# Patient Record
Sex: Female | Born: 1950 | Race: White | Hispanic: No | Marital: Married | Smoking: Never smoker
Health system: Southern US, Community
[De-identification: ages and names within clinical notes are randomized; demographics above are authoritative.]

## PROBLEM LIST (undated history)

## (undated) DIAGNOSIS — G4733 Obstructive sleep apnea (adult) (pediatric): Secondary | ICD-10-CM

## (undated) DIAGNOSIS — Z950 Presence of cardiac pacemaker: Secondary | ICD-10-CM

## (undated) DIAGNOSIS — Z95810 Presence of automatic (implantable) cardiac defibrillator: Secondary | ICD-10-CM

## (undated) DIAGNOSIS — E538 Deficiency of other specified B group vitamins: Secondary | ICD-10-CM

## (undated) DIAGNOSIS — E119 Type 2 diabetes mellitus without complications: Secondary | ICD-10-CM

## (undated) DIAGNOSIS — E785 Hyperlipidemia, unspecified: Secondary | ICD-10-CM

## (undated) DIAGNOSIS — T8859XA Other complications of anesthesia, initial encounter: Secondary | ICD-10-CM

## (undated) DIAGNOSIS — Z6839 Body mass index (BMI) 39.0-39.9, adult: Secondary | ICD-10-CM

## (undated) DIAGNOSIS — Z8719 Personal history of other diseases of the digestive system: Secondary | ICD-10-CM

## (undated) DIAGNOSIS — I519 Heart disease, unspecified: Secondary | ICD-10-CM

## (undated) DIAGNOSIS — G43909 Migraine, unspecified, not intractable, without status migrainosus: Secondary | ICD-10-CM

## (undated) DIAGNOSIS — I2510 Atherosclerotic heart disease of native coronary artery without angina pectoris: Secondary | ICD-10-CM

## (undated) DIAGNOSIS — I509 Heart failure, unspecified: Secondary | ICD-10-CM

## (undated) DIAGNOSIS — F419 Anxiety disorder, unspecified: Secondary | ICD-10-CM

## (undated) DIAGNOSIS — I454 Nonspecific intraventricular block: Secondary | ICD-10-CM

## (undated) DIAGNOSIS — I5189 Other ill-defined heart diseases: Secondary | ICD-10-CM

## (undated) DIAGNOSIS — Z9889 Other specified postprocedural states: Secondary | ICD-10-CM

## (undated) DIAGNOSIS — I428 Other cardiomyopathies: Secondary | ICD-10-CM

## (undated) DIAGNOSIS — R112 Nausea with vomiting, unspecified: Secondary | ICD-10-CM

## (undated) DIAGNOSIS — M479 Spondylosis, unspecified: Secondary | ICD-10-CM

## (undated) DIAGNOSIS — I10 Essential (primary) hypertension: Secondary | ICD-10-CM

## (undated) DIAGNOSIS — E7800 Pure hypercholesterolemia, unspecified: Secondary | ICD-10-CM

## (undated) DIAGNOSIS — M549 Dorsalgia, unspecified: Secondary | ICD-10-CM

## (undated) HISTORY — DX: Atherosclerotic heart disease of native coronary artery without angina pectoris: I25.10

## (undated) HISTORY — DX: Body mass index (BMI) 39.0-39.9, adult: Z68.39

## (undated) HISTORY — DX: Hyperlipidemia, unspecified: E78.5

## (undated) HISTORY — PX: CHOLECYSTECTOMY: SHX55

## (undated) HISTORY — DX: Other cardiomyopathies: I42.8

## (undated) HISTORY — DX: Type 2 diabetes mellitus without complications: E11.9

## (undated) HISTORY — DX: Nonspecific intraventricular block: I45.4

## (undated) HISTORY — DX: Heart failure, unspecified: I50.9

## (undated) HISTORY — DX: Dorsalgia, unspecified: M54.9

## (undated) HISTORY — DX: Essential (primary) hypertension: I10

## (undated) HISTORY — DX: Heart disease, unspecified: I51.9

## (undated) HISTORY — DX: Deficiency of other specified B group vitamins: E53.8

## (undated) HISTORY — PX: ABDOMINAL HYSTERECTOMY: SHX81

## (undated) HISTORY — DX: Obstructive sleep apnea (adult) (pediatric): G47.33

## (undated) HISTORY — DX: Pure hypercholesterolemia, unspecified: E78.00

## (undated) HISTORY — PX: PACEMAKER INSERTION: SHX728

## (undated) HISTORY — DX: Spondylosis, unspecified: M47.9

## (undated) HISTORY — DX: Migraine, unspecified, not intractable, without status migrainosus: G43.909

## (undated) HISTORY — DX: Other ill-defined heart diseases: I51.89

## (undated) HISTORY — DX: Anxiety disorder, unspecified: F41.9

---

## 1999-03-01 DIAGNOSIS — E669 Obesity, unspecified: Secondary | ICD-10-CM | POA: Insufficient documentation

## 1999-03-01 DIAGNOSIS — N63 Unspecified lump in unspecified breast: Secondary | ICD-10-CM | POA: Insufficient documentation

## 1999-03-01 HISTORY — DX: Unspecified lump in unspecified breast: N63.0

## 1999-03-01 HISTORY — DX: Obesity, unspecified: E66.9

## 1999-09-02 DIAGNOSIS — I10 Essential (primary) hypertension: Secondary | ICD-10-CM

## 1999-09-02 DIAGNOSIS — E782 Mixed hyperlipidemia: Secondary | ICD-10-CM

## 1999-09-02 HISTORY — DX: Mixed hyperlipidemia: E78.2

## 1999-09-02 HISTORY — DX: Essential (primary) hypertension: I10

## 2007-05-06 HISTORY — PX: BACK SURGERY: SHX140

## 2007-11-12 ENCOUNTER — Ambulatory Visit (HOSPITAL_COMMUNITY): Admission: RE | Admit: 2007-11-12 | Discharge: 2007-11-13 | Payer: Self-pay | Admitting: Neurosurgery

## 2009-05-05 DIAGNOSIS — I219 Acute myocardial infarction, unspecified: Secondary | ICD-10-CM

## 2009-05-05 HISTORY — DX: Acute myocardial infarction, unspecified: I21.9

## 2010-05-05 HISTORY — PX: INSERT / REPLACE / REMOVE PACEMAKER: SUR710

## 2010-09-17 NOTE — Op Note (Signed)
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   PROCEDURE:  C6-C7 anterior cervical decompression and arthrodesis with  allograft and Tether cervical plating.   SURGEON:  Hewitt Shorts, M.D.   ASSISTANT:  Nelia Shi. Webb Silversmith, RN   ANESTHESIA:  General endotracheal.   INDICATIONS:  The patient is a 60 year old woman who presented with a  right C7cervical radiculopathy secondary to a right C6-C7 cervical  desiccation.  Her exam revealed significant weakness to the right  triceps.  Decision was made to do a single level anterior cervical  decompression and arthrodesis.   PROCEDURE:  The patient was brought to the operating room, and placed  under general endotracheal anesthesia.  The patient was placed on 10  pounds of Holter traction.  The neck was prepped with Betadine soap and  solution, and draped in sterile fashion.  A horizontal incision was made  on the left side of the neck.  The line of the incision was infiltrated  with local anesthetic with epinephrine.  Dissection was carried down  through the subcutaneous tissue and platysma.  Bipolar cautery and  electrocautery was used to maintain hemostasis.  Several anterior  jugular veins were coagulated and divided, and then we dissected it  through an avascular plane leaving the sternocleidomastoid, carotid  artery, and jugular vein bilaterally;  and trachea and esophagus  medially.   The ventral aspect of the vertebral column was identified and localizing  x-rays were taken and the C6-C7 intervertebral disk space was  identified.  Discectomy was begun with incision of the annulus and  continued to microcurette with the pituitary rongeurs.  Then, the  microscope was draped and brought to the field to provide additional  navigation, illumination, and visualization.  The remainder of the  decompression was performed using microdissection and microsurgical  technique.   The cauterized end-plates of the vertebral bodies were carefully removed  using microcurette along with the X-max drill.  The anterior osteophytic  overgrowth was removed using the X-max drill and the osteophyte removal  tool.  We then continued the decompression posteriorly.  There were  significant posterior osteophytic overgrowth that was removed using the  X-max drill along with the 2-mm Kerrison punch, and a thin footplate.  Then, we began to remove the posterior longitudinal ligament.   We encountered the disc herniation, which was removed in a piecemeal  fashion removing multiple fragments of disc herniation to the right  side.  The posterior longitudinal ligament was fully removed from across  the posterior aspect of the disc space, and all the disc herniation was  removed.  We were able to decompress the spinal canal and thecal sac as  well as the nerve root within the neural foramina.   Once the decompression was complete, hemostasis was established with the  use of Gelfoam soaked in thrombin.  We measured the height of the  intervertebral disk space, and selected a 7-mm interbody implant.  The  implant was hydrated in saline solution, then positioned in the  intervertebral disk space, and countersunk.  We then selected a 14-mm  Tether cervical plate.  It was secured to the vertebrae with 13-mm x 4.0-  mm in diameter with underlying screws.  Each screw  hole was drilled and  the screws were placed in alternating fashion.  Final tightening was  performed of all 4 screws.   An x-ray was taken and showed 8 screws in the interbody graft at the C6-  C7 level.  However, full visualization was limited due to the patient's  shoulders.  The wound was irrigated with bacitracin solution, checked  for hemostasis, which was confirmed.  Then, we proceeded with the  closure.  The platysma was closed with interrupted inverted 2-0 undyed  Vicryl sutures. The subcutaneous and subcuticular were closed with  interrupted inverted 3-0 undyed Vicryl sutures. The skin was re-  approximated with Dermabond.  The procedure was tolerated well. The  estimated blood loss was 25 mL.  Sponge and needle count were correct.  Following surgery, the patient was taken out of cervical traction,  reversed from the anesthetic, placed in the soft cervical collar,  extubated, and transferred to the recovery room for further care where  she is noted to be removing all 4 extremities to command.      Hewitt Shorts, M.D.  Electronically Signed     RWN/MEDQ  D:  11/12/2007  T:  11/13/2007  Job:  [id number]

## 2011-01-08 HISTORY — PX: CARDIAC DEFIBRILLATOR PLACEMENT: SHX171

## 2011-01-30 LAB — CBC
HCT: 40
Hemoglobin: 14.1
MCHC: 35.3
MCV: 95.9
Platelets: 289
RBC: 4.17
RDW: 13
WBC: 9.4

## 2011-01-30 LAB — BASIC METABOLIC PANEL
BUN: 21
CO2: 26
Calcium: 9.7
Chloride: 102
Creatinine, Ser: 0.85
GFR calc Af Amer: >60
GFR calc non Af Amer: >60
Glucose, Bld: 169 — ABNORMAL HIGH
Potassium: 4.6
Sodium: 137

## 2018-10-21 HISTORY — PX: IMPLANTABLE CARDIOVERTER DEFIBRILLATOR GENERATOR CHANGE: SHX5859

## 2019-07-11 ENCOUNTER — Other Ambulatory Visit: Payer: Self-pay | Admitting: Neurosurgery

## 2019-07-11 DIAGNOSIS — G9519 Other vascular myelopathies: Secondary | ICD-10-CM

## 2019-07-11 DIAGNOSIS — M48062 Spinal stenosis, lumbar region with neurogenic claudication: Secondary | ICD-10-CM

## 2019-07-12 ENCOUNTER — Telehealth: Payer: Self-pay | Admitting: Nurse Practitioner

## 2019-07-12 NOTE — Telephone Encounter (Signed)
Phone call to patient to verify medication list and allergies for myelogram procedure. Pt instructed to hold Lexapro for 48hrs prior to myelogram appointment time. Pt verbalized understanding. Pre and post procedure instructions reviewed with pt. 

## 2019-07-18 ENCOUNTER — Other Ambulatory Visit: Payer: Self-pay

## 2019-07-18 ENCOUNTER — Ambulatory Visit
Admission: RE | Admit: 2019-07-18 | Discharge: 2019-07-18 | Disposition: A | Discharge status: Home / Self Care | Payer: Medicare PPO | Source: Ambulatory Visit | Attending: Neurosurgery | Admitting: Neurosurgery

## 2019-07-18 DIAGNOSIS — N644 Mastodynia: Secondary | ICD-10-CM | POA: Insufficient documentation

## 2019-07-18 DIAGNOSIS — K3184 Gastroparesis: Secondary | ICD-10-CM

## 2019-07-18 DIAGNOSIS — R0609 Other forms of dyspnea: Secondary | ICD-10-CM | POA: Insufficient documentation

## 2019-07-18 DIAGNOSIS — R809 Proteinuria, unspecified: Secondary | ICD-10-CM

## 2019-07-18 DIAGNOSIS — G9519 Other vascular myelopathies: Secondary | ICD-10-CM

## 2019-07-18 DIAGNOSIS — E119 Type 2 diabetes mellitus without complications: Secondary | ICD-10-CM | POA: Insufficient documentation

## 2019-07-18 DIAGNOSIS — F331 Major depressive disorder, recurrent, moderate: Secondary | ICD-10-CM

## 2019-07-18 DIAGNOSIS — E039 Hypothyroidism, unspecified: Secondary | ICD-10-CM | POA: Insufficient documentation

## 2019-07-18 DIAGNOSIS — R0989 Other specified symptoms and signs involving the circulatory and respiratory systems: Secondary | ICD-10-CM | POA: Insufficient documentation

## 2019-07-18 DIAGNOSIS — F411 Generalized anxiety disorder: Secondary | ICD-10-CM

## 2019-07-18 DIAGNOSIS — L98499 Non-pressure chronic ulcer of skin of other sites with unspecified severity: Secondary | ICD-10-CM

## 2019-07-18 DIAGNOSIS — M48062 Spinal stenosis, lumbar region with neurogenic claudication: Secondary | ICD-10-CM

## 2019-07-18 HISTORY — DX: Other specified symptoms and signs involving the circulatory and respiratory systems: R09.89

## 2019-07-18 HISTORY — DX: Non-pressure chronic ulcer of skin of other sites with unspecified severity: L98.499

## 2019-07-18 HISTORY — DX: Generalized anxiety disorder: F41.1

## 2019-07-18 HISTORY — DX: Type 2 diabetes mellitus without complications: E11.9

## 2019-07-18 HISTORY — DX: Other forms of dyspnea: R06.09

## 2019-07-18 HISTORY — DX: Mastodynia: N64.4

## 2019-07-18 HISTORY — DX: Proteinuria, unspecified: R80.9

## 2019-07-18 HISTORY — DX: Major depressive disorder, recurrent, moderate: F33.1

## 2019-07-18 HISTORY — DX: Gastroparesis: K31.84

## 2019-07-18 HISTORY — DX: Hypothyroidism, unspecified: E03.9

## 2019-07-18 MED ORDER — DIAZEPAM 5 MG PO TABS: 5.0000 mg | ORAL_TABLET | Freq: Once | ORAL | Status: DC

## 2019-07-18 MED ORDER — MEPERIDINE HCL 50 MG/ML IJ SOLN
50.0000 mg | Freq: Once | INTRAMUSCULAR | Status: AC
  Administered 2019-07-18: 50 mg via INTRAMUSCULAR

## 2019-07-18 MED ORDER — IOPAMIDOL (ISOVUE-M 200) INJECTION 41%
15.0000 mL | Freq: Once | INTRAMUSCULAR | Status: AC
  Administered 2019-07-18: 2 mL via INTRATHECAL

## 2019-07-18 MED ORDER — ONDANSETRON HCL 4 MG/2ML IJ SOLN
4.0000 mg | Freq: Once | INTRAMUSCULAR | Status: AC
  Administered 2019-07-18: 4 mg via INTRAMUSCULAR

## 2019-07-18 NOTE — Progress Notes (Signed)
Patient states she has been off Lexapro for at least the past two days. 

## 2019-07-18 NOTE — Discharge Instructions (Signed)
Myelogram Discharge Instructions  1. Go home and rest quietly for the next 24 hours.  It is important to lie flat for the next 24 hours.  Get up only to go to the restroom.  You may lie in the bed or on a couch on your back, your stomach, your left side or your right side.  You may have one pillow under your head.  You may have pillows between your knees while you are on your side or under your knees while you are on your back.  2. DO NOT drive today.  Recline the seat as far back as it will go, while still wearing your seat belt, on the way home.  3. You may get up to go to the bathroom as needed.  You may sit up for 10 minutes to eat.  You may resume your normal diet and medications unless otherwise indicated.  Drink lots of extra fluids today and tomorrow.  4. The incidence of headache, nausea, or vomiting is about 5% (one in 20 patients).  If you develop a headache, lie flat and drink plenty of fluids until the headache goes away.  Caffeinated beverages may be helpful.  If you develop severe nausea and vomiting or a headache that does not go away with flat bed rest, call [phone].  5. You may resume normal activities after your 24 hours of bed rest is over; however, do not exert yourself strongly or do any heavy lifting tomorrow. If when you get up you have a headache when standing, go back to bed and force fluids for another 24 hours.  6. Call your physician for a follow-up appointment.  The results of your myelogram will be sent directly to your physician by the following day.  7. If you have any questions or if complications develop after you arrive home, please call [phone].  Discharge instructions have been explained to the patient.  The patient, or the person responsible for the patient, fully understands these instructions.  YOU MAY RESTART YOUR LEXAPRO TOMORROW 07/19/19 AT 1:00PM.

## 2019-08-25 ENCOUNTER — Encounter: Payer: Self-pay | Admitting: Cardiovascular Disease

## 2019-09-05 ENCOUNTER — Telehealth: Payer: Self-pay

## 2019-09-05 NOTE — Telephone Encounter (Signed)
   [facility] Pre-operative Risk Assessment    Request for surgical clearance:  1. What type of surgery is being performed? Radiculopathy--lumbar region   2. When is this surgery scheduled? TBD   3. What type of clearance is required (medical clearance vs. Pharmacy clearance to hold med vs. Both)? both  4. Are there any medications that need to be held prior to surgery and how long? Unknown   5. Practice name and name of physician performing surgery? [facility] --Marchia Meiers. Vertell Limber MD    6. What is your office phone number [phone]    7.   What is your office fax number [phone]  8.   Anesthesia type (None, local, MAC, general) ? General    Dana Grant Dana Grant 09/05/2019, 3:26 PM  _________________________________________________________________   (provider comments below)

## 2019-09-07 NOTE — Telephone Encounter (Signed)
   Primary Cardiologist:No primary care provider on file.  Chart reviewed as part of pre-operative protocol coverage. Dana Grant has never been evaluated by cardiology. Will need to have an office visit. I will route this recommendation to the requesting surgeon office.   Laverda Page, NP  09/07/2019, 10:46 AM

## 2019-09-16 ENCOUNTER — Encounter: Payer: Self-pay | Admitting: *Deleted

## 2019-09-19 ENCOUNTER — Encounter: Payer: Self-pay | Admitting: Cardiovascular Disease

## 2019-09-19 ENCOUNTER — Ambulatory Visit: Payer: Medicare PPO | Admitting: Cardiovascular Disease

## 2019-09-19 ENCOUNTER — Other Ambulatory Visit: Payer: Self-pay

## 2019-09-19 VITALS — BP 142/84 | HR 83 | Ht 66.0 in | Wt 238.4 lb

## 2019-09-19 DIAGNOSIS — I10 Essential (primary) hypertension: Secondary | ICD-10-CM | POA: Diagnosis not present

## 2019-09-19 DIAGNOSIS — I5022 Chronic systolic (congestive) heart failure: Secondary | ICD-10-CM | POA: Diagnosis not present

## 2019-09-19 DIAGNOSIS — Z95810 Presence of automatic (implantable) cardiac defibrillator: Secondary | ICD-10-CM | POA: Diagnosis not present

## 2019-09-19 DIAGNOSIS — Z01818 Encounter for other preprocedural examination: Secondary | ICD-10-CM

## 2019-09-19 NOTE — Patient Instructions (Addendum)
Medication Instructions:  Continue all current medications.  Labwork: none  Testing/Procedures: none  Follow-Up: 3 months   Any Other Special Instructions Will Be Listed Below (If Applicable). You have been referred to:  EP - Dr. Johney Frame   If you need a refill on your cardiac medications before your next appointment, please call your pharmacy.

## 2019-09-19 NOTE — Progress Notes (Addendum)
CARDIOLOGY CONSULT NOTE  Patient ID: Dana Grant MRN: [id number] DOB/AGE: [birth year] 69 y.o.  Admit date: (Not on file) Primary Physician: Lonia Mad, MD  Reason for Consultation: Preoperative risk stratification  HPI: Dana Grant is a 69 y.o. female who is being seen today for the evaluation of Preoperative risk stratification at the request of Erline Levine, MD.   She required spinal surgery for lumbar radiculopathy.  The patient is a poor historian and is unable to provide many details.  She did tell me that she had a pacemaker/defibrillator placed in 2012 in [place].  She then followed with a cardiologist in [place] but she has not seen since 2019.  Her husband is also present.  He said that she underwent a generator change within the past 1 or 2 years.  She is currently on carvedilol and losartan.  She mentions something about her PCP stopping both of these medications after getting back some blood work.  She told me that her left leg was swollen but then her PCP stopped Lasix after reviewing blood work.  I do not have a copy of any notes from her PCP, cardiologist, nor do I have any recent blood work.  She denies shocks from her defibrillator as well as palpitations.  She denies a history of cardiac catheterization.  She said she underwent a stress test in 2009.  I personally reviewed the ECG performed today which demonstrates a ventricular paced rhythm.   Allergies  Allergen Reactions  . Codeine Nausea And Vomiting  . Sulfa Antibiotics Nausea And Vomiting    Current Outpatient Medications  Medication Sig Dispense Refill  . ALPRAZolam (XANAX) 1 MG tablet Take 1 mg by mouth 3 (three) times daily as needed for anxiety.    . carvedilol (COREG) 6.25 MG tablet Take 6.25 mg by mouth 2 (two) times daily.     . cyanocobalamin (,VITAMIN B-12,) 1000 MCG/ML injection Inject 1,000 mcg into the muscle 2 (two) times a week.    . furosemide (LASIX) 20 MG  tablet Take 20 mg by mouth daily. To equal 60mg  daily    . furosemide (LASIX) 40 MG tablet Take 1 tablet by mouth daily. To equal 60mg  daily.    Marland Kitchen gabapentin (NEURONTIN) 100 MG capsule Take 200 mg by mouth 3 (three) times daily.    . insulin NPH-regular Human (70-30) 100 UNIT/ML injection Inject 70 Units into the skin 2 (two) times daily.     Marland Kitchen levothyroxine (SYNTHROID) 75 MCG tablet Take 75 mcg by mouth in the morning.    Marland Kitchen losartan (COZAAR) 50 MG tablet Take 50 mg by mouth 2 (two) times daily.    . meclizine (ANTIVERT) 25 MG tablet Take 25 mg by mouth as needed for dizziness.    . Vitamin D, Ergocalciferol, (DRISDOL) 1.25 MG (50000 UNIT) CAPS capsule Take 50,000 Units by mouth 2 (two) times a week.     No current facility-administered medications for this visit.    History reviewed. No pertinent past medical history.  History reviewed. No pertinent surgical history.  Social History   Socioeconomic History  . Marital status: Married    Spouse name: Not on file  . Number of children: Not on file  . Years of education: Not on file  . Highest education level: Not on file  Occupational History  . Not on file  Tobacco Use  . Smoking status: Never Smoker  . Smokeless tobacco: Never Used  Substance and Sexual Activity  .  Alcohol use: Not on file  . Drug use: Not on file  . Sexual activity: Not on file  Other Topics Concern  . Not on file  Social History Narrative  . Not on file   Social Determinants of Health   Financial Resource Strain:   . Difficulty of Paying Living Expenses:   Food Insecurity:   . Worried About Programme researcher, broadcasting/film/video in the Last Year:   . Barista in the Last Year:   Transportation Needs:   . Freight forwarder (Medical):   Marland Kitchen Lack of Transportation (Non-Medical):   Physical Activity:   . Days of Exercise per Week:   . Minutes of Exercise per Session:   Stress:   . Feeling of Stress :   Social Connections:   . Frequency of Communication with  Friends and Family:   . Frequency of Social Gatherings with Friends and Family:   . Attends Religious Services:   . Active Member of Clubs or Organizations:   . Attends Banker Meetings:   Marland Kitchen Marital Status:   Intimate Partner Violence:   . Fear of Current or Ex-Partner:   . Emotionally Abused:   Marland Kitchen Physically Abused:   . Sexually Abused:      No family history of premature CAD in 1st degree relatives.  Current Meds  Medication Sig  . ALPRAZolam (XANAX) 1 MG tablet Take 1 mg by mouth 3 (three) times daily as needed for anxiety.  . carvedilol (COREG) 6.25 MG tablet Take 6.25 mg by mouth 2 (two) times daily.   . cyanocobalamin (,VITAMIN B-12,) 1000 MCG/ML injection Inject 1,000 mcg into the muscle 2 (two) times a week.  . furosemide (LASIX) 20 MG tablet Take 20 mg by mouth daily. To equal 60mg  daily  . furosemide (LASIX) 40 MG tablet Take 1 tablet by mouth daily. To equal 60mg  daily.  gabapentin (NEURONTIN) 100 MG capsule Take 200 mg by mouth 3 (three) times daily.  . insulin NPH-regular Human (70-30) 100 UNIT/ML injection Inject 70 Units into the skin 2 (two) times daily.   levothyroxine (SYNTHROID) 75 MCG tablet Take 75 mcg by mouth in the morning.  Marland Kitchen losartan (COZAAR) 50 MG tablet Take 50 mg by mouth 2 (two) times daily.  . meclizine (ANTIVERT) 25 MG tablet Take 25 mg by mouth as needed for dizziness.  . Vitamin D, Ergocalciferol, (DRISDOL) 1.25 MG (50000 UNIT) CAPS capsule Take 50,000 Units by mouth 2 (two) times a week.      Review of systems complete and found to be negative unless listed above in HPI   [facility], LPN was present throughout the entirety of the encounter.  Physical exam Blood pressure (!) 142/84, pulse 83, height 5\' 6"  (1.676 m), weight 238 lb 6.4 oz (108.1 kg), SpO2 92 %. General: NAD Neck: No JVD, no thyromegaly or thyroid nodule.  Lungs: Clear to auscultation bilaterally with normal respiratory effort. CV: Nondisplaced PMI. Regular  rate and rhythm, normal S1/S2, no S3/S4, no murmur.  Trace bilateral lower extremity edema.   Abdomen: Soft, nontender, no distention.  Skin: Intact without lesions or rashes.  Neurologic: Alert and oriented x 3.  Memory appears to be poor. Psych: Normal affect. Extremities: No clubbing or cyanosis.  HEENT: Normal.   ECG: Most recent ECG reviewed.   Labs: Lab Results  Component Value Date/Time   K 4.6 11/12/2007 01:10 PM   BUN 21 11/12/2007 01:10 PM   CREATININE 0.85 11/12/2007 01:10  PM   HGB 14.1 11/12/2007 01:10 PM     Lipids: No results found for: LDLCALC, LDLDIRECT, CHOL, TRIG, HDL      ASSESSMENT AND PLAN:   1.  Chronic systolic heart failure: No evidence of hypervolemia.  I have no cardiac records including no copy of most recent echocardiogram nor cardiology office notes.  I will try to obtain these.  Currently on carvedilol, losartan, and Lasix 60 mg daily.  There apparently have been some medication adjustments performed by her PCP but I am unclear on these details as I have no copy of her PCP office notes.  I will try to request these as well.  2.  ICD: No recent shocks.  She says this was initially implanted in 2012 with generator change within the past 1 or 2 years.  I will make a referral to the device clinic and try to obtain cardiac records from her cardiologist in [place].  3.  Preoperative risk stratification: I am unable to provide any assistance with this as I have no cardiac records and no PCP notes or labs.  Further recommendations will be made after these are obtained and I have had a chance to review them.  4.  Hypertension: BP is mildly elevated.  No changes to therapy today as I am uncertain as to which medications she is actually taking.    Disposition: Follow up in 3 months  Signed: Prentice Docker, M.D., F.A.C.C.  09/19/2019, 3:25 PM    Addendum: I was able to obtain some of the patient's records.    She apparently had normalization of  LVEF by echocardiogram in February 2015.  She reportedly underwent cardiac catheterization in 2012 with pacemaker placed in September 2012 and generator change in June 2020.  She underwent a normal Lexiscan Myoview in February 2016.  I also reviewed labs dated 08/25/2019: White blood cells 11.4, hemoglobin 12.4, platelets 241, BUN 26, creatinine 1.63, sodium 140, potassium 4.4, hemoglobin A1c 7.9%, TSH 52.32.

## 2019-09-20 NOTE — Addendum Note (Signed)
Addended by: Burman Nieves T on: 09/20/2019 08:56 AM   Modules accepted: Orders

## 2019-09-22 ENCOUNTER — Telehealth: Payer: Self-pay | Admitting: Cardiovascular Disease

## 2019-09-22 NOTE — Telephone Encounter (Signed)
   [facility] Pre-operative Risk Assessment    HEARTCARE STAFF: - Please ensure there is not already an duplicate clearance open for this procedure - Under Visit Info/Reason for Call, type in Other and utilize the format Clearance MM/DD/YY or Clearance TBD  Request for surgical clearance:  1. What type of surgery is being performed?  L2-3  L3-4 L4-5 Anterolateral lumbar interbody fusion with percutaneous pedicle screw fixation  2. When is this surgery scheduled? TBD   3. What type of clearance is required (medical clearance vs. Pharmacy clearance to hold med vs. Both)? Not stated  4. Are there any medications that need to be held prior to surgery and how long? Doesn't state medication but asks if patient needs to hold medication and for how many days before surgery  5. Practice name and name of physician performing surgery? [facility]   Dr Erline Levine  6. What is the office phone number? [phone]   7.   What is the office fax number? [phone]  8.   Anesthesia type (None, local, MAC, general) ?  Asking the following ___ Low Risk  ___Moderate Risk __High Risk  ___ Surgery should be postponed    Weston Anna 09/22/2019, 1:30 PM  _________________________________________________________________   (provider comments below)

## 2019-10-04 NOTE — Telephone Encounter (Signed)
Called the requesting office of [facility]  and spoke with Shanda Bumps in surgery scheduling. Shanda Bumps stated that the patient is wanting the procedure done ASAP. The surgeon's schedule can be done at end of June early July. Shanda Bumps informed me that the patient did not want to return to her old cardiologist who is over 3 hours away and that their office is aware that records would be needed if the patient saw a new cardiologist in the area.

## 2019-10-04 NOTE — Telephone Encounter (Signed)
Patient was recently seen by Dr. Purvis Sheffield for preop clearance, however due to lack of record, Dr. Purvis Sheffield could not make a decision at the time. I have messaged his nurse Garnet Sierras LPN in our [place] to make sure those record are requested before Dr. Purvis Sheffield returns to the office. Unfortunately, I think Dr. Purvis Sheffield is out of office for 3 weeks. Callback pool to check with requesting provider to see how urgent this surgery is?

## 2019-10-05 NOTE — Telephone Encounter (Signed)
Pt has an appointment with Dr. Johney Frame on 10/12/19

## 2019-10-06 ENCOUNTER — Telehealth: Payer: Self-pay | Admitting: Cardiovascular Disease

## 2019-10-06 NOTE — Telephone Encounter (Signed)
Please make sure we get Dr. Johney Frame a copy of her old cardiology record from [facility] for Dr. Johney Frame to make a decision. Also note on the visit that patient require cardiac clearance

## 2019-10-06 NOTE — Telephone Encounter (Signed)
    Went to chart to check who called pt. Transferred call to [facility]

## 2019-10-06 NOTE — Telephone Encounter (Signed)
Patient returned my call and informed me the the cardiologist she saw in [place] is Dr. Bobby Rumpf - [facility]  Phone #: [phone] Fax: [phone].  I called Dr. Mady Gemma office to see if they can send records to our office but I have send over the a request. I have faxed over a medical records request over to the Dr. Elder Love office.

## 2019-10-06 NOTE — Telephone Encounter (Signed)
I left a message for the patient to return my call to inquire about her cardiologist she saw in [place].  I also call over to Dr. Purvis Sheffield office in [place] to see if they received any notes on the patient but they informed me that they haven't.

## 2019-10-11 ENCOUNTER — Telehealth: Payer: Self-pay

## 2019-10-11 NOTE — Telephone Encounter (Signed)
GAVE NOTES FROM [facility] TO [facility]ICAL RECORDS TO SCAN INTO CHART

## 2019-10-11 NOTE — Telephone Encounter (Signed)
Dana Grant states that she has received notes from Dr. Elder Love from [facility] and has given them to Medical Records to scan to the chart. Will remove from the pre-op pool.

## 2019-10-11 NOTE — Telephone Encounter (Signed)
We have not received records from Dr. Bobby Rumpf at [facility] yet. I have spoke to [facility] in [facility] and she is going to work on getting records prior to this patient's appointment with Dr. Johney Frame tomorrow 10/12/19 that way Dr. Johney Frame will be able to review notes and make a decision regarding clearance for his upcoming procedure. Will forward to his RN to make aware.

## 2019-10-11 NOTE — Telephone Encounter (Signed)
Patient has an appointment with Dr. Johney Frame tomorrow so pre-op risk assessment can be completed at that time. Will route this message to Dr. Johney Frame so that he is aware and remove from pre-op pool.

## 2019-10-12 ENCOUNTER — Other Ambulatory Visit: Payer: Self-pay

## 2019-10-12 ENCOUNTER — Telehealth (INDEPENDENT_AMBULATORY_CARE_PROVIDER_SITE_OTHER): Payer: Medicare PPO | Admitting: Internal Medicine

## 2019-10-12 ENCOUNTER — Encounter: Payer: Self-pay | Admitting: Internal Medicine

## 2019-10-12 VITALS — BP 116/75 | HR 81 | Ht 66.0 in | Wt 238.5 lb

## 2019-10-12 DIAGNOSIS — I428 Other cardiomyopathies: Secondary | ICD-10-CM | POA: Diagnosis not present

## 2019-10-12 DIAGNOSIS — I5022 Chronic systolic (congestive) heart failure: Secondary | ICD-10-CM | POA: Diagnosis not present

## 2019-10-12 DIAGNOSIS — Z01810 Encounter for preprocedural cardiovascular examination: Secondary | ICD-10-CM | POA: Diagnosis not present

## 2019-10-12 DIAGNOSIS — Z01818 Encounter for other preprocedural examination: Secondary | ICD-10-CM

## 2019-10-12 NOTE — Progress Notes (Signed)
Electrophysiology TeleHealth Note   Due to national recommendations of social distancing due to COVID 19, Audio/video telehealth visit is felt to be most appropriate for this patient at this time.  See MyChart message from today for patient consent regarding telehealth for [facility].   Date:  10/12/2019   ID:  Dana Grant, DOB 07-12-1950, MRN [id number]  Location: home Provider location: [facility] Evaluation Performed: New patient consult  PCP:  Arma Heading, MD  Cardiologist:  Prentice Docker, MD  Electrophysiologist:  None   Chief Complaint:  CHF  History of Present Illness:    Dana Grant is a 69 y.o. female who presents via audio/video conferencing for a telehealth visit today.   The patient is referred for new consultation regarding risk stratification of sudden death by Dr Purvis Sheffield. She has a history of nonischemic CM and is s/p CRT-D implant in [place].  She denies prior ICD shock therapy.  Currently, she is doing well. Her primary issues are with back pain.  She is planning to have surgery soon.  Today, she denies symptoms of palpitations, chest pain, shortness of breath, orthopnea, PND, lower extremity edema, claudication, dizziness, presyncope, syncope, bleeding, or neurologic sequela. The patient is tolerating medications without difficulties and is otherwise without complaint today.     Past Medical History:  Diagnosis Date  . Anxiety   . Anxiety state 07/18/2019   ICD10 Conversion  . Back pain   . BBB (bundle branch block)   . BMI 39.0-39.9,adult   . Breast mass 03/01/1999  . CAD (coronary artery disease)   . Cardiopathy   . CHF (congestive heart failure) (HCC)   . Chronic ulcer of other specified site 07/18/2019  . DM (diabetes mellitus) (HCC)   . Dyslipidemia   . Essential hypertension 09/02/1999   ICD10 Conversion  . Gastroparesis 07/18/2019  . Hypercholesteremia   . Hyperlipidemia, mixed 09/02/1999  . Hypertension   .  Hypothyroidism 07/18/2019   ICD10 Conversion  . Left ventricular systolic dysfunction   . Major depressive disorder, recurrent episode, moderate (HCC) 07/18/2019  . Mastodynia 07/18/2019  . Migraine   . Nonischemic cardiomyopathy (HCC)   . Obesity 03/01/1999   ICD10 Conversion  . OSA on CPAP   . Other dyspnea and respiratory abnormality 07/18/2019  . Proteinuria 07/18/2019  . Spondylosis   . Type II or unspecified type diabetes mellitus without mention of complication, not stated as uncontrolled 07/18/2019  . Vitamin B12 deficiency     Past Surgical History:  Procedure Laterality Date  . CARDIAC DEFIBRILLATOR PLACEMENT  01/08/2011   implanted in [place] (SJM) BiV ICD  . CESAREAN SECTION    . CHOLECYSTECTOMY    . IMPLANTABLE CARDIOVERTER DEFIBRILLATOR GENERATOR CHANGE  10/21/2018   placed in [place],  [place] model [id number] [phone] [id number]) BiV ICD,  RV lead- [phone] [facility] [id number])  implanted by Dr Sabino Donovan    Current Outpatient Medications  Medication Sig Dispense Refill  . ALPRAZolam (XANAX) 1 MG tablet Take 1 mg by mouth 3 (three) times daily as needed for anxiety.    Marland Kitchen aspirin EC 81 MG tablet Take 81 mg by mouth daily.    Marland Kitchen atorvastatin (LIPITOR) 10 MG tablet Take 10 mg by mouth daily.    . carvedilol (COREG) 6.25 MG tablet Take 6.25 mg by mouth 2 (two) times daily.     . cyanocobalamin (,VITAMIN B-12,) 1000 MCG/ML injection Inject 1,000 mcg into the muscle every 30 (thirty) days.     Marland Kitchen  DULoxetine (CYMBALTA) 30 MG capsule Take 1 capsule by mouth in the morning and at bedtime.    . furosemide (LASIX) 20 MG tablet Take 20 mg by mouth daily. To equal 60mg  daily    . furosemide (LASIX) 40 MG tablet Take 1 tablet by mouth daily. To equal 60mg  daily.    Marland Kitchen gabapentin (NEURONTIN) 600 MG tablet Take 1 tablet by mouth 4 (four) times daily.    Marland Kitchen ibuprofen (ADVIL) 800 MG tablet Take 1 tablet by mouth as needed for pain.    Marland Kitchen insulin NPH-regular Human (70-30) 100 UNIT/ML injection Inject 70  Units into the skin 2 (two) times daily.     Marland Kitchen levothyroxine (SYNTHROID) 75 MCG tablet Take 75 mcg by mouth in the morning.    Marland Kitchen losartan (COZAAR) 50 MG tablet Take 50 mg by mouth 2 (two) times daily.    . meclizine (ANTIVERT) 25 MG tablet Take 25 mg by mouth as needed for dizziness.    . Multiple Vitamin (MULTIVITAMIN) capsule Take 1 capsule by mouth daily.    Marland Kitchen omega-3 acid ethyl esters (LOVAZA) 1 g capsule Take by mouth 2 (two) times daily.    . Vitamin D, Ergocalciferol, (DRISDOL) 1.25 MG (50000 UNIT) CAPS capsule Take 50,000 Units by mouth 2 (two) times a week.     No current facility-administered medications for this visit.    Allergies:   Codeine, Sulfa antibiotics, Demerol [meperidine], Lisinopril, and Penicillins   Social History:  The patient  reports that she has never smoked. She has never used smokeless tobacco. She reports that she does not drink alcohol or use drugs.   Family History:  The patient's  family history includes CAD in her mother.    ROS:  Please see the history of present illness.   All other systems are personally reviewed and negative.    Exam:    Vital Signs:  BP 116/75   Pulse 81   Ht 5\' 6"  (1.676 m)   Wt 238 lb 8 oz (108.2 kg)   BMI 38.49 kg/m    Well sounding,  Poor hearing, alert and conversant    Labs/Other Tests and Data Reviewed:    Recent Labs: No results found for requested labs within last 8760 hours.   Wt Readings from Last 3 Encounters:  10/12/19 238 lb 8 oz (108.2 kg)  09/19/19 238 lb 6.4 oz (108.1 kg)     Other studies personally reviewed: Additional studies/ records that were reviewed today include: prior ICD interrogation (in media), Dr Raylene Everts notes, generator change note from 10/2018  Review of the above records today demonstrates: as above   ASSESSMENT & PLAN:    1.  Nonischemic CM/ chronic systolic dysfunction S/p prior CRT-D device (St Jude) as above. I will arrange remote monitoring through our office and also  ICM follow-up. No changes indicated at this time.  2. preop clearance Ok to proceed from an EP standpoint with back surgery Dr Court Joy notes suggest that he is obtaining records from primary care/ prior cardiology before making his recommendations regarding surgical clearance    Follow-up:  12 months with me in [place]  Patient Risk:  after full review of this patients clinical status, I feel that they are at moderate risk at this time.   Today, I have spent 20 minutes with the patient with telehealth technology discussing ICD care .    Signed, Thompson Grayer MD, [place] 10/12/2019 10:31 AM   [facility]  [address] [place] [address] [phone] (office) [phone] (fax)

## 2019-10-14 ENCOUNTER — Other Ambulatory Visit: Payer: Self-pay | Admitting: Neurosurgery

## 2019-10-24 ENCOUNTER — Telehealth: Payer: Self-pay

## 2019-10-24 NOTE — Telephone Encounter (Signed)
-----   Message from Hillis Range, MD sent at 10/12/2019 10:32 AM EDT ----- Please enroll in [facility] device clinic

## 2019-10-24 NOTE — Telephone Encounter (Signed)
Patient referred to [facility] clinic by Dr Johney Frame.  Attempted ICM referral call for intro and left message for return call.    Reviewed Corvue updated Naval architect through 10/23/2019.  Corvue Thoracic impedance suggesting start of possible fluid accumulation since 10/22/2019.

## 2019-10-31 NOTE — Progress Notes (Signed)
[id number] [place], [place] [address] [place] [address] Phone: [phone] Fax: [phone]      Your procedure is scheduled on Thursday 11/03/2019.  Report to [facility] Main Entrance "A" at 07:00 A.M., and check in at the Admitting office.  Call this number if you have problems the morning of surgery:  [phone]  Call [phone] if you have any questions prior to your surgery date Monday-Friday 8am-4pm    Remember:  Do not eat or drink after midnight the night before your surgery   Take these medicines the morning of surgery with A SIP OF WATER: Carvedilol (Coreg) Duloxetine (Cymbalta) Gabapentin (Neurontin) Levothyroxine (Synthroid)  If needed: Alprazolam (Xanax)   As of today, STOP taking any Aspirin (unless otherwise instructed by your surgeon) and Aspirin containing products, Aleve, Naproxen, Ibuprofen, Motrin, Advil, Goody's, BC's, all herbal medications, fish oil, and all vitamins.  Follow your surgeon's instructions on when to stop Aspirin.  If no instructions were given by your surgeon then you will need to call the office to get those instructions.     WHAT DO I DO ABOUT MY DIABETES MEDICATION?   Marland Kitchen Do not take oral diabetes medicines (pills) the morning of surgery.  . THE NIGHT BEFORE SURGERY, take 49 units of Humulin (70/30) insulin.       . THE MORNING OF SURGERY, do not take any insulin.    HOW TO MANAGE YOUR DIABETES BEFORE AND AFTER SURGERY  Why is it important to control my blood sugar before and after surgery? . Improving blood sugar levels before and after surgery helps healing and can limit problems. . A way of improving blood sugar control is eating a healthy diet by: o  Eating less sugar and carbohydrates o  Increasing activity/exercise o  Talking with your doctor about reaching your blood sugar goals . High blood sugars (greater than 180 mg/dL) can raise your risk of infections and slow your  recovery, so you will need to focus on controlling your diabetes during the weeks before surgery. . Make sure that the doctor who takes care of your diabetes knows about your planned surgery including the date and location.  How do I manage my blood sugar before surgery? . Check your blood sugar at least 4 times a day, starting 2 days before surgery, to make sure that the level is not too high or low. . Check your blood sugar the morning of your surgery when you wake up and every 2 hours until you get to the Short Stay unit. o If your blood sugar is less than 70 mg/dL, you will need to treat for low blood sugar: - Do not take insulin. - Treat a low blood sugar (less than 70 mg/dL) with  cup of clear juice (cranberry or apple), 4 glucose tablets, OR glucose gel. - Recheck blood sugar in 15 minutes after treatment (to make sure it is greater than 70 mg/dL). If your blood sugar is not greater than 70 mg/dL on recheck, call [phone] for further instructions. . Report your blood sugar to the short stay nurse when you get to Short Stay.  . If you are admitted to the hospital after surgery: o Your blood sugar will be checked by the staff and you will probably be given insulin after surgery (instead of oral diabetes medicines) to make sure you have good blood sugar levels. o The goal for blood sugar control after surgery is 80-180 mg/dL.  Do not wear jewelry, make up, or nail polish            Do not wear lotions, powders, perfumes/colognes, or deodorant.            Do not shave 48 hours prior to surgery.  Men may shave face and neck.            Do not bring valuables to the hospital.            [facility] is not responsible for any belongings or valuables.  Do NOT Smoke (Tobacco/Vapping) or drink Alcohol 24 hours prior to your procedure  If you use a CPAP at night, you may bring all equipment for your overnight stay.   Contacts, glasses, dentures or bridgework may not be  worn into surgery.      For patients admitted to the hospital, discharge time will be determined by your treatment team.   Patients discharged the day of surgery will not be allowed to drive home, and someone needs to stay with them for 24 hours.    Special instructions:   [facility]- Preparing For Surgery  Before surgery, you can play an important role. Because skin is not sterile, your skin needs to be as free of germs as possible. You can reduce the number of germs on your skin by washing with CHG (chlorahexidine gluconate) Soap before surgery.  CHG is an antiseptic cleaner which kills germs and bonds with the skin to continue killing germs even after washing.    Oral Hygiene is also important to reduce your risk of infection.  Remember - BRUSH YOUR TEETH THE MORNING OF SURGERY WITH YOUR REGULAR TOOTHPASTE  Please do not use if you have an allergy to CHG or antibacterial soaps. If your skin becomes reddened/irritated stop using the CHG.  Do not shave (including legs and underarms) for at least 48 hours prior to first CHG shower. It is OK to shave your face.  Please follow these instructions carefully.   1. Shower the NIGHT BEFORE SURGERY and the MORNING OF SURGERY with CHG Soap.   2. If you chose to wash your hair, wash your hair first as usual with your normal shampoo.  3. After you shampoo, rinse your hair and body thoroughly to remove the shampoo.  4. Use CHG as you would any other liquid soap. You can apply CHG directly to the skin and wash gently with a scrungie or a clean washcloth.   5. Apply the CHG Soap to your body ONLY FROM THE NECK DOWN.  Do not use on open wounds or open sores. Avoid contact with your eyes, ears, mouth and genitals (private parts). Wash Face and genitals (private parts)  with your normal soap.   6. Wash thoroughly, paying special attention to the area where your surgery will be performed.  7. Thoroughly rinse your body with warm water from the neck  down.  8. DO NOT shower/wash with your normal soap after using and rinsing off the CHG Soap.  9. Pat yourself dry with a CLEAN TOWEL.  10. Wear CLEAN PAJAMAS to bed the night before surgery, wear comfortable clothes the morning of surgery  11. Place CLEAN SHEETS on your bed the night of your first shower and DO NOT SLEEP WITH PETS.   Day of Surgery:   Do not apply any deodorants/lotions.  Please wear clean clothes to the hospital/surgery center.   Remember to brush your teeth WITH YOUR REGULAR TOOTHPASTE.   Please  read over the following fact sheets that you were given.

## 2019-11-01 ENCOUNTER — Other Ambulatory Visit: Payer: Self-pay

## 2019-11-01 ENCOUNTER — Other Ambulatory Visit (HOSPITAL_COMMUNITY)
Admission: RE | Admit: 2019-11-01 | Discharge: 2019-11-01 | Disposition: A | Discharge status: Home / Self Care | Payer: Medicare PPO | Source: Ambulatory Visit | Attending: Neurosurgery | Admitting: Neurosurgery

## 2019-11-01 ENCOUNTER — Encounter (HOSPITAL_COMMUNITY)
Admission: RE | Admit: 2019-11-01 | Discharge: 2019-11-01 | Disposition: A | Discharge status: Home / Self Care | Payer: Medicare PPO | Source: Ambulatory Visit | Attending: Neurosurgery | Admitting: Neurosurgery

## 2019-11-01 ENCOUNTER — Encounter (HOSPITAL_COMMUNITY): Payer: Self-pay

## 2019-11-01 HISTORY — DX: Presence of cardiac pacemaker: Z95.0

## 2019-11-01 HISTORY — DX: Presence of automatic (implantable) cardiac defibrillator: Z95.810

## 2019-11-01 HISTORY — DX: Nausea with vomiting, unspecified: Z98.890

## 2019-11-01 HISTORY — DX: Personal history of other diseases of the digestive system: Z87.19

## 2019-11-01 HISTORY — DX: Other complications of anesthesia, initial encounter: T88.59XA

## 2019-11-01 HISTORY — DX: Nausea with vomiting, unspecified: R11.2

## 2019-11-01 LAB — CBC
HCT: 43.8 % (ref 36.0–46.0)
Hemoglobin: 15.1 g/dL — ABNORMAL HIGH (ref 12.0–15.0)
MCH: 33.9 pg (ref 26.0–34.0)
MCHC: 34.5 g/dL (ref 30.0–36.0)
MCV: 98.2 fL (ref 80.0–100.0)
Platelets: 240 10*3/uL (ref 150–400)
RBC: 4.46 MIL/uL (ref 3.87–5.11)
RDW: 12.6 % (ref 11.5–15.5)
WBC: 9.6 10*3/uL (ref 4.0–10.5)
nRBC: 0 % (ref 0.0–0.2)

## 2019-11-01 LAB — BASIC METABOLIC PANEL
Anion gap: 10 (ref 5–15)
BUN: 21 mg/dL (ref 8–23)
CO2: 28 mmol/L (ref 22–32)
Calcium: 9.5 mg/dL (ref 8.9–10.3)
Chloride: 103 mmol/L (ref 98–111)
Creatinine, Ser: 1.21 mg/dL — ABNORMAL HIGH (ref 0.44–1.00)
GFR calc Af Amer: 52 mL/min — ABNORMAL LOW (ref >60)
GFR calc non Af Amer: 45 mL/min — ABNORMAL LOW (ref >60)
Glucose, Bld: 144 mg/dL — ABNORMAL HIGH (ref 70–99)
Potassium: 3.7 mmol/L (ref 3.5–5.1)
Sodium: 141 mmol/L (ref 135–145)

## 2019-11-01 LAB — TYPE AND SCREEN
ABO/RH(D): A POS
Antibody Screen: NEGATIVE

## 2019-11-01 LAB — SURGICAL PCR SCREEN
MRSA, PCR: NEGATIVE
Staphylococcus aureus: NEGATIVE

## 2019-11-01 LAB — ABO/RH: ABO/RH(D): A POS

## 2019-11-01 LAB — GLUCOSE, CAPILLARY: Glucose-Capillary: 139 mg/dL — ABNORMAL HIGH (ref 70–99)

## 2019-11-01 LAB — SARS CORONAVIRUS 2 (TAT 6-24 HRS): SARS Coronavirus 2: NEGATIVE

## 2019-11-01 NOTE — H&P (Signed)
Patient ID:   [id number] Patient: Dana Grant  Date of Birth: [birth year] Visit Type: Office Visit   Date: 08/29/2019 02:15 PM Provider: [name]. Venetia Maxon MD   This 69 year old female presents for back pain.  HISTORY OF PRESENT ILLNESS: 1.  back pain  Dana Grant, 69 year old female, visits in transfer of care on Dr. Earl Gala retirement.  She has been experiencing lumbosacral and left greater than right leg pain since February, following her bought with COVID-19.  Neurogenic claudication has been present for several years.    ESI x1 4 weeks ago by Dr. Craig Guess offered no relief  Ibuprofen 800 milligrams 1-2/day Gabapentin 300 milligrams 2 tabs q.i.d. by Dr Dillard Essex (PCP in [place])  History:  COVID-19 diagnosed January 2021, IDDM, HTN, MI 2011 (cardiologist Dr. Johnsie Kindred in [place]) Surgical history:  ACDF C6-7 2009 by Dr. Newell Coral, cholecystectomy, C-sections x2, pacemaker and internal defibrillator placements  Lumbar CT myelogram 07/18/2019 on canopy  The patient is currently describing 8/10 pain.  She notes severe pain down into her coccyx and bilateral buttock pain below the which line.  She says she has no better with sitting and is complaining mainly of pain at the lumbosacral junction.  Her lumbar imaging demonstrates multilevel degenerative changes L2-3 L3-4, L4-5 levels with severe spinal stenosis she has auto fusion at the L5-S1 level and has anterolisthesis of L4 on L5 with significant sagittal imbalance.  She has retroverted pelvis and is consider Leigh out of balance forward.  The patient notes swelling in her left leg and foot and continues to complain of severe low back pain.      Medical/Surgical/Interim History Reviewed, no change.  Last detailed document date:03/18/2016.     PAST MEDICAL HISTORY, SURGICAL HISTORY, FAMILY HISTORY, SOCIAL HISTORY AND REVIEW OF SYSTEMS I have reviewed the patient's past medical, surgical, family and social  history as well as the comprehensive review of systems as included on the [facility] history form dated 07/05/2019, which I have signed.  Family History: Reviewed, no changes.  Last detailed document date:03/18/2016.   Social History: Reviewed, no changes. Last detailed document date: 03/18/2016.    MEDICATIONS: (added, continued or stopped this visit) Started Medication Directions Instruction Stopped  atorvastatin 10 mg tablet take 1 tablet by oral route  every day    carvedilol 6.25 mg tablet take 1 tablet by oral route 2 times every day with food    escitalopram 10 mg tablet take 1 tablet by oral route  every day    furosemide 20 mg tablet take 1 tablet by oral route  every day    gabapentin 100 mg capsule take 2 capsule by oral route 3 times every day    levothyroxine 75 mcg tablet take 1 tablet by oral route  every day    losartan 50 mg tablet take 1 tablet by oral route 2 times every day    meclizine 25 mg tablet take 1 tablet by oral route  every day as needed    metformin 500 mg tablet take 1 tablet by oral route  every day with morning and evening meals    Novolin 70/30 U-100 Insulin 100 unit/mL subcutaneous suspension inject 70 units by subcutaneous route in the morning and 30 units at night    Vitamin B-12 ER 2,000 mcg tablet,extended release TAKE 1 TABLET TWICE A MONTH    Vitamin D2 50,000 unit capsule take 1 capsule by oral route 2 times every week    Xanax  1 mg tablet take 1 tablet by oral route 3 times every day      ALLERGIES: Ingredient Reaction Medication Name Comment CODEINE Nausea/Vomiting   SULFA (SULFONAMIDE ANTIBIOTICS)     Reviewed, no changes.   REVIEW OF SYSTEMS  See scanned patient registration form, dated 07/05/2019, signed and dated on 08/29/2019  Review of Systems Details System Neg/Pos Details Constitutional Negative Chills, Fatigue, Fever, Malaise,  Night sweats, Weight gain and Weight loss. ENMT Negative Ear drainage, Hearing loss, Nasal drainage, Otalgia, Sinus pressure and Sore throat. Eyes Negative Eye discharge, Eye pain and Vision changes. Respiratory Negative Chronic cough, Cough, Dyspnea, Known TB exposure and Wheezing. Cardio Negative Chest pain, Claudication, Edema and Irregular heartbeat/palpitations. GI Negative Abdominal pain, Blood in stool, Change in stool pattern, Constipation, Decreased appetite, Diarrhea, Heartburn, Nausea and Vomiting. GU Negative Dysuria, Hematuria, Polyuria (Genitourinary), Urinary frequency, Urinary incontinence and Urinary retention. Endocrine Negative Cold intolerance, Heat intolerance, Polydipsia and Polyphagia. Neuro Negative Dizziness, Extremity weakness, Gait disturbance, Headache, Memory impairment, Numbness in extremity, Seizures and Tremors. Psych Negative Anxiety, Depression and Insomnia. Integumentary Negative Brittle hair, Brittle nails, Change in shape/size of mole(s), Hair loss, Hirsutism, Hives, Pruritus, Rash and Skin lesion. MS Positive Back pain. Hema/Lymph Negative Easy bleeding, Easy bruising and Lymphadenopathy. Allergic/Immuno Negative Contact allergy, Environmental allergies, Food allergies and Seasonal allergies. Reproductive Negative Breast discharge, Breast lumps, Dysmenorrhea, Dyspareunia, History of abnormal PAP smear, Hot flashes, Irregular menses and Vaginal discharge.  PHYSICAL EXAM:  Vitals Date Temp F BP Pulse Ht In Wt Lb BMI BSA Pain Score 08/29/2019  117/65 86 66 244 39.38  8/10   PHYSICAL EXAM Details General Level of Distress: no acute distress Overall Appearance: normal  Head and Face  Right Left  Fundoscopic Exam:  normal normal    Cardiovascular Cardiac: regular rate and rhythm without murmur  Right Left  Carotid Pulses: normal normal  Respiratory Lungs: clear to  auscultation  Neurological Orientation: normal Recent and Remote Memory: normal Attention Span and Concentration:   normal Language: normal Fund of Knowledge: normal  Right Left Sensation: normal normal Upper Extremity Coordination: normal normal  Lower Extremity Coordination: normal normal  Musculoskeletal Gait and Station: normal  Right Left Upper Extremity Muscle Strength: normal normal Lower Extremity Muscle Strength: normal normal Upper Extremity Muscle Tone:  normal normal Lower Extremity Muscle Tone: normal normal   Motor Strength Upper and lower extremity motor strength was tested in the clinically pertinent muscles.     Deep Tendon Reflexes  Right Left Biceps: normal normal Triceps: normal normal Brachioradialis: normal normal Patellar: normal normal Achilles: normal normal  Sensory Sensation was tested at L1 to S1.   Cranial Nerves II. Optic Nerve/Visual Fields: normal III. Oculomotor: normal IV. Trochlear: normal V. Trigeminal: normal VI. Abducens: normal VII. Facial: normal VIII. Acoustic/Vestibular: normal IX. Glossopharyngeal: normal X. Vagus: normal XI. Spinal Accessory: normal XII. Hypoglossal: normal  Motor and other Tests Lhermittes: negative Rhomberg: negative Pronator drift: absent     Right Left Hoffman's: normal normal Clonus: normal normal Babinski: normal normal SLR: negative negative Patrick's Pearlean Brownie): negative negative Toe Walk: normal normal Toe Lift: normal normal Heel Walk: normal normal SI Joint: nontender nontender   Additional Findings:  Pain at the lumbosacral junction.  Negative straight leg raise.  Unable to stand without significant discomfort    IMPRESSION:  The patient has severe low back pain, sagittal imbalance, anterolisthesis of L4 on L5, marked disc degeneration L2-3, L3-4, L4-5 levels, auto fusion L5-S1.  She has severe spinal stenosis L2-3,  L3-4, L4-5 levels.  The patient has multiple  comorbidities including prior MI and implanted pacemaker/defibrillator, morbid obesity.  Scoliosis x-rays confirmed sagittal imbalance.  PLAN: The patient is not doing well with conservative management and is having a significant amount of back pain which is incapacitating to her.  She has severe spinal stenosis.  I have recommended decompression and fusion L2 through L5 levels with XLIF and percutaneous pedicle screw fixation.  This will not completely relieve her sagittal imbalance but may relieve a lot for back pain which is currently incapacitating to her.  The large surgery that would be required for correction of sagittal imbalance is not one that I would recommend given her multiple comorbidities.  I will meet with the patient discuss treatment options before proceeding with surgery after we have completed her scoliosis imaging.  Orders: Diagnostic Procedures: Assessment Procedure M41.20 Scoliosis- AP/Lat Z61.096 Lumbar Spine- AP/Lat Instruction(s)/Education: Assessment Instruction Z68.39 Dietary management education, guidance, and counseling Miscellaneous: Assessment  M41.20 LSO Brace M48.062 LSO Brace  Completed Orders (this encounter) Order Details Reason Side Interpretation Result Initial Treatment Date Region Scoliosis- AP/Lat      08/29/2019 All Levels to All Levels Dietary management education, guidance, and counseling Encouraged patient to eat well balanced diet.        Assessment/Plan  # Detail Type Description  1. 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     2. Assessment Dorsalgia, unspecified (M54.9).     3. Assessment Radiculopathy, lumbar region (M54.16).     4. Assessment Scoliosis (and kyphoscoliosis), idiopathic (M41.20).  Plan Orders LSO Brace.     5. Assessment Spondylolisthesis, lumbar region (M43.16).     6. Assessment Lumbar stenosis with neurogenic  claudication (M48.062).  Plan Orders LSO Brace.     7. Assessment Body mass index (BMI) 39.0-39.9, adult (Z68.39).  Plan Orders Today's instructions / counseling include(s) Dietary management education, guidance, and counseling. Clinical information/comments: Encouraged patient to eat well balanced diet.       Pain Management Plan Pain Scale: 8/10. Method: Numeric Pain Intensity Scale. Location: back. Onset: 05/06/2019. Duration: varies. Quality: discomforting. Pain management follow-up plan of care: Patient will continue medication management..              Provider:  [name]. Venetia Maxon MD  09/02/2019 12:58 PM    Dictation edited by: [name]. Venetia Maxon    CC Providers: Arma Heading [address] [place],  [place]  [address]   [facility]  [address] [place], [place] [address]               Electronically signed by [name]. Venetia Maxon MD on 09/02/2019 12:58 PM

## 2019-11-01 NOTE — Progress Notes (Signed)
PCP - Dr. Norman Clay in [place], [place] Cardiologist - Dr. Elder Love in [place], [place] Dr. Marney Setting in [place] inserted her PPM/Debrillator  PPM/ICD - Yes Device Orders - Faxed Rep Notified -   Chest x-ray - Not indicated EKG - 09/19/19 Stress Test - 08/15/10 ECHO - 2019 Cardiac Cath - 10/21/19   Sleep Study - Yes diagnosed with OSA CPAP - Yes nightly  Fasting Blood Sugar - averages in the 90's  Checks Blood Sugar _3 x  times a week  Aspirin Instructions: Given by surgeon stopped 10/20/19  COVID TEST-  11/01/19  Anesthesia review: Yes PPM & ICD  Patient denies shortness of breath, fever, cough and chest pain at PAT appointment   All instructions explained to the patient, with a verbal understanding of the material. Patient agrees to go over the instructions while at home for a better understanding. Patient also instructed to self quarantine after being tested for COVID-19. The opportunity to ask questions was provided.

## 2019-11-02 MED ORDER — VANCOMYCIN HCL 1500 MG/300ML IV SOLN
1500.0000 mg | INTRAVENOUS | Status: AC
  Administered 2019-11-03: 1500 mg via INTRAVENOUS
  Filled 2019-11-02: qty 300

## 2019-11-02 NOTE — Anesthesia Preprocedure Evaluation (Addendum)
Anesthesia Evaluation  Patient identified by MRN, date of birth, ID band Patient awake    Reviewed: Allergy & Precautions, NPO status , Patient's Chart, lab work & pertinent test results, reviewed documented beta blocker date and time   History of Anesthesia Complications (+) PONV and history of anesthetic complications  Airway Mallampati: III  TM Distance: >3 FB Neck ROM: Full    Dental  (+) Dental Advisory Given   Pulmonary sleep apnea and Continuous Positive Airway Pressure Ventilation ,    breath sounds clear to auscultation       Cardiovascular hypertension, Pt. on medications and Pt. on home beta blockers + CAD, + Past MI and +CHF  + dysrhythmias + pacemaker + Cardiac Defibrillator  Rhythm:Regular     Neuro/Psych  Headaches, PSYCHIATRIC DISORDERS Anxiety Depression    GI/Hepatic Neg liver ROS, hiatal hernia,   Endo/Other  diabetesHypothyroidism   Renal/GU negative Renal ROS     Musculoskeletal  (+) Arthritis ,   Abdominal   Peds  Hematology   Anesthesia Other Findings   Reproductive/Obstetrics                           Anesthesia Physical Anesthesia Plan  ASA: III  Anesthesia Plan: General   Post-op Pain Management:    Induction: Intravenous  PONV Risk Score and Plan: 4 or greater and Ondansetron, Midazolam and Treatment may vary due to age or medical condition  Airway Management Planned: Oral ETT  Additional Equipment: None  Intra-op Plan:   Post-operative Plan: Extubation in OR  Informed Consent: I have reviewed the patients History and Physical, chart, labs and discussed the procedure including the risks, benefits and alternatives for the proposed anesthesia with the patient or authorized representative who has indicated his/her understanding and acceptance.     Dental advisory given  Plan Discussed with: CRNA and Surgeon  Anesthesia Plan Comments: (History of  nonischemic cardiomyopathy s/p St Jude BiV ICD (recent generator changeout 10/21/18 in [place]).  Previously followed with cardiology in [place], [place], now follows locally with general cardiologist Dr. Purvis Sheffield and EP cardiologist Dr. Johney Frame.  Established care with Dr. Purvis Sheffield 09/19/2019.  Per note, "I was able to obtain some of the patient's records. She apparently had normalization of LVEF by echocardiogram in February 2015.  She reportedly underwent cardiac catheterization in 2012 with pacemaker placed in September 2012 and generator change in June 2020.  She underwent a normal Lexiscan Myoview in February 2016."  He also noted that labs dated 08/25/2019 from PCP showed TSH 52.32.  Review of recent PCP note shows patient had not been compliant with her levothyroxine.  She was restarted on this and TSH was retested 09/20/2019 and was 2.42.  Patient seen by Dr. Johney Frame to establish EP cardiology care on 10/12/2019 and was doing well, cleared to proceed with surgery from an EP cardiology standpoint.  Clearance from Dr. Purvis Sheffield in staff message dated 10/25/19 states "She can proceed with surgery as planned." Copy on chart.  Copy of EP device recommendations dated 11/02/19 on chart states procedure should not interfere with device function; no device reprogramming or making appointment needed.  This is from patient's prior EP cardiologist Dr. Elder Love  A1c 7.9 on labs from PCP dated 08/25/2019.  Preop labs reviewed, unremarkable.   EKG 09/19/19: Ventricular paced rhythm.  Rate 81.)       Anesthesia Quick Evaluation

## 2019-11-02 NOTE — Progress Notes (Signed)
Anesthesia Chart Review:  History of nonischemic cardiomyopathy s/p St Jude BiV ICD (recent generator changeout 10/21/18 in [place]).  Previously followed with cardiology in [place], [place], now follows locally with general cardiologist Dr. Purvis Sheffield and EP cardiologist Dr. Johney Frame.  Established care with Dr. Purvis Sheffield 09/19/2019.  Per note, "I was able to obtain some of the patient's records. She apparently had normalization of LVEF by echocardiogram in February 2015.  She reportedly underwent cardiac catheterization in 2012 with pacemaker placed in September 2012 and generator change in June 2020.  She underwent a normal Lexiscan Myoview in February 2016."  He also noted that labs dated 08/25/2019 from PCP showed TSH 52.32.  Review of recent PCP note shows patient had not been compliant with her levothyroxine.  She was restarted on this and TSH was retested 09/20/2019 and was 2.42.  Patient seen by Dr. Johney Frame to establish EP cardiology care on 10/12/2019 and was doing well, cleared to proceed with surgery from an EP cardiology standpoint.  Clearance from Dr. Purvis Sheffield in staff message dated 10/25/19 states "She can proceed with surgery as planned." Copy on chart.  Copy of EP device recommendations dated 11/02/19 on chart states procedure should not interfere with device function; no device reprogramming or making appointment needed.  This is from patient's prior EP cardiologist Dr. Elder Love  A1c 7.9 on labs from PCP dated 08/25/2019.  Preop labs reviewed, unremarkable.   EKG 09/19/19: Ventricular paced rhythm.  Rate 81.   Zannie Cove [facility] Short Stay Center/Anesthesiology Phone [phone] 11/02/2019 9:04 AM

## 2019-11-03 ENCOUNTER — Inpatient Hospital Stay (HOSPITAL_COMMUNITY): Payer: Medicare PPO | Admitting: Certified Registered Nurse Anesthetist

## 2019-11-03 ENCOUNTER — Inpatient Hospital Stay (HOSPITAL_COMMUNITY)
Admission: RE | Admit: 2019-11-03 | Discharge: 2019-11-05 | DRG: 460 | Disposition: A | Discharge status: Home / Self Care | Payer: Medicare PPO | Attending: Neurosurgery | Admitting: Neurosurgery

## 2019-11-03 ENCOUNTER — Inpatient Hospital Stay (HOSPITAL_COMMUNITY): Payer: Medicare PPO

## 2019-11-03 ENCOUNTER — Inpatient Hospital Stay (HOSPITAL_COMMUNITY): Payer: Medicare PPO | Admitting: Physician Assistant

## 2019-11-03 ENCOUNTER — Encounter (HOSPITAL_COMMUNITY)
Admission: RE | Disposition: A | Discharge status: Home / Self Care | Payer: Self-pay | Source: Home / Self Care | Attending: Neurosurgery

## 2019-11-03 DIAGNOSIS — F329 Major depressive disorder, single episode, unspecified: Secondary | ICD-10-CM | POA: Diagnosis present

## 2019-11-03 DIAGNOSIS — M4316 Spondylolisthesis, lumbar region: Secondary | ICD-10-CM | POA: Diagnosis present

## 2019-11-03 DIAGNOSIS — I252 Old myocardial infarction: Secondary | ICD-10-CM

## 2019-11-03 DIAGNOSIS — M48062 Spinal stenosis, lumbar region with neurogenic claudication: Principal | ICD-10-CM | POA: Diagnosis present

## 2019-11-03 DIAGNOSIS — E039 Hypothyroidism, unspecified: Secondary | ICD-10-CM | POA: Diagnosis present

## 2019-11-03 DIAGNOSIS — F419 Anxiety disorder, unspecified: Secondary | ICD-10-CM | POA: Diagnosis present

## 2019-11-03 DIAGNOSIS — M4126 Other idiopathic scoliosis, lumbar region: Secondary | ICD-10-CM | POA: Diagnosis present

## 2019-11-03 DIAGNOSIS — M5386 Other specified dorsopathies, lumbar region: Secondary | ICD-10-CM | POA: Diagnosis present

## 2019-11-03 DIAGNOSIS — M5116 Intervertebral disc disorders with radiculopathy, lumbar region: Secondary | ICD-10-CM | POA: Diagnosis present

## 2019-11-03 DIAGNOSIS — Z6839 Body mass index (BMI) 39.0-39.9, adult: Secondary | ICD-10-CM

## 2019-11-03 DIAGNOSIS — Z79899 Other long term (current) drug therapy: Secondary | ICD-10-CM | POA: Diagnosis not present

## 2019-11-03 DIAGNOSIS — G4733 Obstructive sleep apnea (adult) (pediatric): Secondary | ICD-10-CM | POA: Diagnosis present

## 2019-11-03 DIAGNOSIS — Z8616 Personal history of COVID-19: Secondary | ICD-10-CM | POA: Diagnosis not present

## 2019-11-03 DIAGNOSIS — I10 Essential (primary) hypertension: Secondary | ICD-10-CM | POA: Diagnosis present

## 2019-11-03 DIAGNOSIS — Z794 Long term (current) use of insulin: Secondary | ICD-10-CM

## 2019-11-03 DIAGNOSIS — E119 Type 2 diabetes mellitus without complications: Secondary | ICD-10-CM | POA: Diagnosis present

## 2019-11-03 DIAGNOSIS — I2510 Atherosclerotic heart disease of native coronary artery without angina pectoris: Secondary | ICD-10-CM | POA: Diagnosis present

## 2019-11-03 DIAGNOSIS — Z95810 Presence of automatic (implantable) cardiac defibrillator: Secondary | ICD-10-CM | POA: Diagnosis not present

## 2019-11-03 DIAGNOSIS — M4327 Fusion of spine, lumbosacral region: Secondary | ICD-10-CM | POA: Diagnosis present

## 2019-11-03 DIAGNOSIS — Z79890 Hormone replacement therapy (postmenopausal): Secondary | ICD-10-CM | POA: Diagnosis not present

## 2019-11-03 DIAGNOSIS — M4726 Other spondylosis with radiculopathy, lumbar region: Secondary | ICD-10-CM | POA: Diagnosis present

## 2019-11-03 DIAGNOSIS — Z419 Encounter for procedure for purposes other than remedying health state, unspecified: Secondary | ICD-10-CM

## 2019-11-03 HISTORY — PX: ANTERIOR LAT LUMBAR FUSION: SHX1168

## 2019-11-03 HISTORY — PX: LUMBAR PERCUTANEOUS PEDICLE SCREW 3 LEVEL: SHX5562

## 2019-11-03 LAB — GLUCOSE, CAPILLARY
Glucose-Capillary: 302 mg/dL — ABNORMAL HIGH (ref 70–99)
Glucose-Capillary: 288 mg/dL — ABNORMAL HIGH (ref 70–99)
Glucose-Capillary: 293 mg/dL — ABNORMAL HIGH (ref 70–99)
Glucose-Capillary: 193 mg/dL — ABNORMAL HIGH (ref 70–99)
Glucose-Capillary: 162 mg/dL — ABNORMAL HIGH (ref 70–99)
Glucose-Capillary: 204 mg/dL — ABNORMAL HIGH (ref 70–99)
Glucose-Capillary: 281 mg/dL — ABNORMAL HIGH (ref 70–99)
Glucose-Capillary: 300 mg/dL — ABNORMAL HIGH (ref 70–99)
Glucose-Capillary: 276 mg/dL — ABNORMAL HIGH (ref 70–99)
Glucose-Capillary: 300 mg/dL — ABNORMAL HIGH (ref 70–99)

## 2019-11-03 LAB — HEMOGLOBIN A1C
Hgb A1c MFr Bld: 9.2 % — ABNORMAL HIGH (ref 4.8–5.6)
Mean Plasma Glucose: 217.34 mg/dL

## 2019-11-03 SURGERY — ANTERIOR LATERAL LUMBAR FUSION 3 LEVELS
Anesthesia: General | Site: Spine Lumbar

## 2019-11-03 MED ORDER — ONDANSETRON HCL 4 MG PO TABS: 4.0000 mg | ORAL_TABLET | Freq: Four times a day (QID) | ORAL | Status: DC | PRN

## 2019-11-03 MED ORDER — OXYCODONE HCL 5 MG PO TABS
10.0000 mg | ORAL_TABLET | Freq: Once | ORAL | Status: AC
  Administered 2019-11-03: 10 mg via ORAL

## 2019-11-03 MED ORDER — INSULIN ASPART 100 UNIT/ML ~~LOC~~ SOLN
SUBCUTANEOUS | Status: AC
  Filled 2019-11-03: qty 1

## 2019-11-03 MED ORDER — DULOXETINE HCL 30 MG PO CPEP
30.0000 mg | ORAL_CAPSULE | Freq: Two times a day (BID) | ORAL | Status: DC
  Administered 2019-11-03 – 2019-11-05 (×3): 30 mg via ORAL
  Filled 2019-11-03 (×4): qty 1

## 2019-11-03 MED ORDER — VITAMIN D (ERGOCALCIFEROL) 1.25 MG (50000 UNIT) PO CAPS
50000.0000 [IU] | ORAL_CAPSULE | ORAL | Status: DC
  Administered 2019-11-03: 50000 [IU] via ORAL
  Filled 2019-11-03: qty 1

## 2019-11-03 MED ORDER — ONDANSETRON HCL 4 MG/2ML IJ SOLN
INTRAMUSCULAR | Status: DC | PRN
  Administered 2019-11-03: 4 mg via INTRAVENOUS

## 2019-11-03 MED ORDER — PHENOL 1.4 % MT LIQD: 1.0000 | OROMUCOSAL | Status: DC | PRN

## 2019-11-03 MED ORDER — METHOCARBAMOL 500 MG PO TABS
500.0000 mg | ORAL_TABLET | Freq: Four times a day (QID) | ORAL | Status: DC | PRN
  Administered 2019-11-04 (×3): 500 mg via ORAL
  Filled 2019-11-03 (×3): qty 1

## 2019-11-03 MED ORDER — OXYCODONE HCL 5 MG/5ML PO SOLN: 5.0000 mg | Freq: Once | ORAL | Status: DC | PRN

## 2019-11-03 MED ORDER — LIDOCAINE 2% (20 MG/ML) 5 ML SYRINGE
INTRAMUSCULAR | Status: AC
  Filled 2019-11-03: qty 10

## 2019-11-03 MED ORDER — PROPOFOL 1000 MG/100ML IV EMUL
INTRAVENOUS | Status: AC
  Filled 2019-11-03: qty 100

## 2019-11-03 MED ORDER — DEXAMETHASONE SODIUM PHOSPHATE 10 MG/ML IJ SOLN
INTRAMUSCULAR | Status: DC | PRN
  Administered 2019-11-03: 5 mg via INTRAVENOUS

## 2019-11-03 MED ORDER — PHENYLEPHRINE HCL-NACL 10-0.9 MG/250ML-% IV SOLN
INTRAVENOUS | Status: DC | PRN
Start: 2019-11-03 — End: 2019-11-03
  Administered 2019-11-03: 50 ug/min via INTRAVENOUS

## 2019-11-03 MED ORDER — ONDANSETRON HCL 4 MG/2ML IJ SOLN
4.0000 mg | Freq: Four times a day (QID) | INTRAMUSCULAR | Status: DC | PRN
  Administered 2019-11-04: 4 mg via INTRAVENOUS
  Filled 2019-11-03: qty 2

## 2019-11-03 MED ORDER — ORAL CARE MOUTH RINSE: 15.0000 mL | Freq: Once | OROMUCOSAL | Status: AC

## 2019-11-03 MED ORDER — IBUPROFEN 200 MG PO TABS: 800.0000 mg | ORAL_TABLET | Freq: Three times a day (TID) | ORAL | Status: DC | PRN

## 2019-11-03 MED ORDER — BUPIVACAINE HCL (PF) 0.5 % IJ SOLN
INTRAMUSCULAR | Status: DC | PRN
  Administered 2019-11-03: 32 mL

## 2019-11-03 MED ORDER — CHLORHEXIDINE GLUCONATE CLOTH 2 % EX PADS: 6.0000 | MEDICATED_PAD | Freq: Once | CUTANEOUS | Status: DC

## 2019-11-03 MED ORDER — INSULIN ASPART 100 UNIT/ML ~~LOC~~ SOLN
10.0000 [IU] | Freq: Once | SUBCUTANEOUS | Status: AC
  Administered 2019-11-03: 10 [IU] via SUBCUTANEOUS
  Filled 2019-11-03: qty 0.1

## 2019-11-03 MED ORDER — LIDOCAINE-EPINEPHRINE 1 %-1:100000 IJ SOLN
INTRAMUSCULAR | Status: AC
  Filled 2019-11-03: qty 2

## 2019-11-03 MED ORDER — HYDROMORPHONE HCL 1 MG/ML IJ SOLN
0.2500 mg | INTRAMUSCULAR | Status: DC | PRN
  Administered 2019-11-03 (×2): 0.5 mg via INTRAVENOUS

## 2019-11-03 MED ORDER — SODIUM CHLORIDE 0.9 % IV SOLN
250.0000 mL | INTRAVENOUS | Status: DC
  Administered 2019-11-03: 250 mL via INTRAVENOUS

## 2019-11-03 MED ORDER — ACETAMINOPHEN 325 MG PO TABS: 650.0000 mg | ORAL_TABLET | ORAL | Status: DC | PRN

## 2019-11-03 MED ORDER — VANCOMYCIN HCL IN DEXTROSE 1-5 GM/200ML-% IV SOLN
INTRAVENOUS | Status: AC
  Filled 2019-11-03: qty 200

## 2019-11-03 MED ORDER — KETAMINE HCL 10 MG/ML IJ SOLN
INTRAMUSCULAR | Status: DC | PRN
Start: 2019-11-03 — End: 2019-11-03
  Administered 2019-11-03: 10 mg via INTRAVENOUS
  Administered 2019-11-03: 40 mg via INTRAVENOUS
  Administered 2019-11-03 (×4): 10 mg via INTRAVENOUS

## 2019-11-03 MED ORDER — HYDROCODONE-ACETAMINOPHEN 5-325 MG PO TABS
1.0000 | ORAL_TABLET | ORAL | Status: DC | PRN
  Administered 2019-11-05: 2 via ORAL
  Filled 2019-11-03: qty 2

## 2019-11-03 MED ORDER — SUCCINYLCHOLINE CHLORIDE 200 MG/10ML IV SOSY
PREFILLED_SYRINGE | INTRAVENOUS | Status: AC
  Filled 2019-11-03: qty 10

## 2019-11-03 MED ORDER — FUROSEMIDE 40 MG PO TABS
40.0000 mg | ORAL_TABLET | Freq: Every day | ORAL | Status: DC
  Administered 2019-11-04 – 2019-11-05 (×2): 40 mg via ORAL
  Filled 2019-11-03 (×2): qty 1

## 2019-11-03 MED ORDER — LIDOCAINE-EPINEPHRINE 1 %-1:100000 IJ SOLN
INTRAMUSCULAR | Status: DC | PRN
  Administered 2019-11-03: 32 mL

## 2019-11-03 MED ORDER — DIAZEPAM 5 MG/ML IJ SOLN
INTRAMUSCULAR | Status: AC
  Filled 2019-11-03: qty 2

## 2019-11-03 MED ORDER — 0.9 % SODIUM CHLORIDE (POUR BTL) OPTIME
TOPICAL | Status: DC | PRN
  Administered 2019-11-03: 1000 mL

## 2019-11-03 MED ORDER — GABAPENTIN 600 MG PO TABS
600.0000 mg | ORAL_TABLET | Freq: Four times a day (QID) | ORAL | Status: DC
  Administered 2019-11-03 – 2019-11-05 (×5): 600 mg via ORAL
  Filled 2019-11-03 (×7): qty 1

## 2019-11-03 MED ORDER — POLYETHYLENE GLYCOL 3350 17 G PO PACK: 17.0000 g | PACK | Freq: Every day | ORAL | Status: DC | PRN

## 2019-11-03 MED ORDER — CARVEDILOL 6.25 MG PO TABS
6.2500 mg | ORAL_TABLET | Freq: Two times a day (BID) | ORAL | Status: DC
  Administered 2019-11-03 – 2019-11-05 (×3): 6.25 mg via ORAL
  Filled 2019-11-03 (×4): qty 1

## 2019-11-03 MED ORDER — PROPOFOL 500 MG/50ML IV EMUL
INTRAVENOUS | Status: DC | PRN
Start: 2019-11-03 — End: 2019-11-03
  Administered 2019-11-03 (×3): 120 ug/kg/min via INTRAVENOUS

## 2019-11-03 MED ORDER — ACETAMINOPHEN 500 MG PO TABS: 1000.0000 mg | ORAL_TABLET | Freq: Once | ORAL | Status: DC | PRN

## 2019-11-03 MED ORDER — THROMBIN 5000 UNITS EX SOLR
OROMUCOSAL | Status: DC | PRN
  Administered 2019-11-03: 5 mL

## 2019-11-03 MED ORDER — MIDAZOLAM HCL 2 MG/2ML IJ SOLN
INTRAMUSCULAR | Status: AC
  Filled 2019-11-03: qty 2

## 2019-11-03 MED ORDER — DIAZEPAM 5 MG/ML IJ SOLN
2.5000 mg | Freq: Once | INTRAMUSCULAR | Status: AC
  Administered 2019-11-03: 2.5 mg via INTRAVENOUS

## 2019-11-03 MED ORDER — FLEET ENEMA 7-19 GM/118ML RE ENEM: 1.0000 | ENEMA | Freq: Once | RECTAL | Status: DC | PRN

## 2019-11-03 MED ORDER — PROPOFOL 10 MG/ML IV BOLUS
INTRAVENOUS | Status: AC
  Filled 2019-11-03: qty 20

## 2019-11-03 MED ORDER — OXYCODONE HCL 5 MG PO TABS
5.0000 mg | ORAL_TABLET | ORAL | Status: DC | PRN
  Administered 2019-11-04 (×4): 10 mg via ORAL
  Filled 2019-11-03 (×4): qty 2

## 2019-11-03 MED ORDER — INSULIN ASPART 100 UNIT/ML ~~LOC~~ SOLN
15.0000 [IU] | Freq: Once | SUBCUTANEOUS | Status: AC
  Administered 2019-11-03: 15 [IU] via SUBCUTANEOUS

## 2019-11-03 MED ORDER — INSULIN ASPART 100 UNIT/ML ~~LOC~~ SOLN
0.0000 [IU] | Freq: Every day | SUBCUTANEOUS | Status: DC
  Administered 2019-11-03: 3 [IU] via SUBCUTANEOUS
  Administered 2019-11-04: 4 [IU] via SUBCUTANEOUS

## 2019-11-03 MED ORDER — LIDOCAINE 2% (20 MG/ML) 5 ML SYRINGE
INTRAMUSCULAR | Status: DC | PRN
  Administered 2019-11-03: 100 mg via INTRAVENOUS

## 2019-11-03 MED ORDER — ACETAMINOPHEN 650 MG RE SUPP: 650.0000 mg | RECTAL | Status: DC | PRN

## 2019-11-03 MED ORDER — LACTATED RINGERS IV SOLN: INTRAVENOUS | Status: DC

## 2019-11-03 MED ORDER — MIDAZOLAM HCL 2 MG/2ML IJ SOLN
INTRAMUSCULAR | Status: DC | PRN
  Administered 2019-11-03 (×2): 1 mg via INTRAVENOUS

## 2019-11-03 MED ORDER — ADULT MULTIVITAMIN W/MINERALS CH
1.0000 | ORAL_TABLET | Freq: Every day | ORAL | Status: DC
  Administered 2019-11-04 – 2019-11-05 (×2): 1 via ORAL
  Filled 2019-11-03 (×2): qty 1

## 2019-11-03 MED ORDER — DEXAMETHASONE SODIUM PHOSPHATE 10 MG/ML IJ SOLN
INTRAMUSCULAR | Status: AC
  Filled 2019-11-03: qty 1

## 2019-11-03 MED ORDER — ALPRAZOLAM 0.5 MG PO TABS
1.0000 mg | ORAL_TABLET | Freq: Three times a day (TID) | ORAL | Status: DC | PRN
  Administered 2019-11-04: 1 mg via ORAL
  Filled 2019-11-03: qty 2

## 2019-11-03 MED ORDER — INSULIN ASPART 100 UNIT/ML ~~LOC~~ SOLN
SUBCUTANEOUS | Status: DC | PRN
Start: 2019-11-03 — End: 2019-11-03
  Administered 2019-11-03: 10 [IU] via SUBCUTANEOUS

## 2019-11-03 MED ORDER — DOCUSATE SODIUM 100 MG PO CAPS
100.0000 mg | ORAL_CAPSULE | Freq: Two times a day (BID) | ORAL | Status: DC
  Administered 2019-11-03 – 2019-11-05 (×3): 100 mg via ORAL
  Filled 2019-11-03 (×4): qty 1

## 2019-11-03 MED ORDER — FENTANYL CITRATE (PF) 250 MCG/5ML IJ SOLN
INTRAMUSCULAR | Status: AC
  Filled 2019-11-03: qty 5

## 2019-11-03 MED ORDER — INSULIN ASPART 100 UNIT/ML ~~LOC~~ SOLN
10.0000 [IU] | Freq: Once | SUBCUTANEOUS | Status: AC
  Administered 2019-11-03: 10 [IU] via SUBCUTANEOUS

## 2019-11-03 MED ORDER — OXYCODONE HCL 5 MG PO TABS: 5.0000 mg | ORAL_TABLET | Freq: Once | ORAL | Status: DC | PRN

## 2019-11-03 MED ORDER — ONDANSETRON HCL 4 MG/2ML IJ SOLN
INTRAMUSCULAR | Status: AC
  Filled 2019-11-03: qty 4

## 2019-11-03 MED ORDER — VANCOMYCIN HCL 1250 MG/250ML IV SOLN
1250.0000 mg | Freq: Once | INTRAVENOUS | Status: AC
  Administered 2019-11-03: 1250 mg via INTRAVENOUS
  Filled 2019-11-03: qty 250

## 2019-11-03 MED ORDER — CYANOCOBALAMIN 1000 MCG/ML IJ SOLN: 1000.0000 ug | INTRAMUSCULAR | Status: DC

## 2019-11-03 MED ORDER — ACETAMINOPHEN 10 MG/ML IV SOLN: 1000.0000 mg | Freq: Once | INTRAVENOUS | Status: DC | PRN

## 2019-11-03 MED ORDER — THROMBIN 5000 UNITS EX SOLR
CUTANEOUS | Status: AC
  Filled 2019-11-03: qty 10000

## 2019-11-03 MED ORDER — ACETAMINOPHEN 160 MG/5ML PO SOLN: 1000.0000 mg | Freq: Once | ORAL | Status: DC | PRN

## 2019-11-03 MED ORDER — ALUM & MAG HYDROXIDE-SIMETH 200-200-20 MG/5ML PO SUSP: 30.0000 mL | Freq: Four times a day (QID) | ORAL | Status: DC | PRN

## 2019-11-03 MED ORDER — FENTANYL CITRATE (PF) 100 MCG/2ML IJ SOLN
25.0000 ug | INTRAMUSCULAR | Status: DC | PRN
  Administered 2019-11-03: 25 ug via INTRAVENOUS
  Administered 2019-11-03: 50 ug via INTRAVENOUS
  Administered 2019-11-03: 25 ug via INTRAVENOUS
  Administered 2019-11-03: 50 ug via INTRAVENOUS

## 2019-11-03 MED ORDER — MENTHOL 3 MG MT LOZG: 1.0000 | LOZENGE | OROMUCOSAL | Status: DC | PRN

## 2019-11-03 MED ORDER — LIDOCAINE-EPINEPHRINE 1 %-1:100000 IJ SOLN
INTRAMUSCULAR | Status: AC
  Filled 2019-11-03: qty 1

## 2019-11-03 MED ORDER — MECLIZINE HCL 25 MG PO TABS
25.0000 mg | ORAL_TABLET | ORAL | Status: DC | PRN
  Filled 2019-11-03: qty 1

## 2019-11-03 MED ORDER — ZOLPIDEM TARTRATE 5 MG PO TABS: 5.0000 mg | ORAL_TABLET | Freq: Every evening | ORAL | Status: DC | PRN

## 2019-11-03 MED ORDER — PANTOPRAZOLE SODIUM 40 MG IV SOLR
40.0000 mg | Freq: Every day | INTRAVENOUS | Status: DC
  Administered 2019-11-03: 40 mg via INTRAVENOUS
  Filled 2019-11-03: qty 40

## 2019-11-03 MED ORDER — FENTANYL CITRATE (PF) 250 MCG/5ML IJ SOLN
INTRAMUSCULAR | Status: DC | PRN
  Administered 2019-11-03 (×2): 50 ug via INTRAVENOUS
  Administered 2019-11-03: 150 ug via INTRAVENOUS
  Administered 2019-11-03 (×5): 50 ug via INTRAVENOUS

## 2019-11-03 MED ORDER — PHENYLEPHRINE 40 MCG/ML (10ML) SYRINGE FOR IV PUSH (FOR BLOOD PRESSURE SUPPORT)
PREFILLED_SYRINGE | INTRAVENOUS | Status: DC | PRN
  Administered 2019-11-03: 120 ug via INTRAVENOUS
  Administered 2019-11-03: 80 ug via INTRAVENOUS

## 2019-11-03 MED ORDER — FENTANYL CITRATE (PF) 100 MCG/2ML IJ SOLN
INTRAMUSCULAR | Status: AC
  Filled 2019-11-03: qty 2

## 2019-11-03 MED ORDER — MULTIVITAMINS PO CAPS: 1.0000 | ORAL_CAPSULE | Freq: Every day | ORAL | Status: DC

## 2019-11-03 MED ORDER — CHLORHEXIDINE GLUCONATE 0.12 % MT SOLN
15.0000 mL | Freq: Once | OROMUCOSAL | Status: AC
  Administered 2019-11-03: 15 mL via OROMUCOSAL
  Filled 2019-11-03: qty 15

## 2019-11-03 MED ORDER — PHENYLEPHRINE 40 MCG/ML (10ML) SYRINGE FOR IV PUSH (FOR BLOOD PRESSURE SUPPORT)
PREFILLED_SYRINGE | INTRAVENOUS | Status: AC
  Filled 2019-11-03: qty 20

## 2019-11-03 MED ORDER — BUPIVACAINE HCL (PF) 0.5 % IJ SOLN
INTRAMUSCULAR | Status: AC
  Filled 2019-11-03: qty 30

## 2019-11-03 MED ORDER — ASPIRIN EC 81 MG PO TBEC
81.0000 mg | DELAYED_RELEASE_TABLET | Freq: Every day | ORAL | Status: DC
  Administered 2019-11-04 – 2019-11-05 (×2): 81 mg via ORAL
  Filled 2019-11-03 (×2): qty 1

## 2019-11-03 MED ORDER — KCL IN DEXTROSE-NACL 20-5-0.45 MEQ/L-%-% IV SOLN: INTRAVENOUS | Status: DC

## 2019-11-03 MED ORDER — LOSARTAN POTASSIUM 50 MG PO TABS
25.0000 mg | ORAL_TABLET | Freq: Every day | ORAL | Status: DC
  Administered 2019-11-03 – 2019-11-05 (×3): 25 mg via ORAL
  Filled 2019-11-03 (×3): qty 1

## 2019-11-03 MED ORDER — BISACODYL 10 MG RE SUPP: 10.0000 mg | Freq: Every day | RECTAL | Status: DC | PRN

## 2019-11-03 MED ORDER — LEVOTHYROXINE SODIUM 75 MCG PO TABS
75.0000 ug | ORAL_TABLET | Freq: Every day | ORAL | Status: DC
  Administered 2019-11-04 – 2019-11-05 (×2): 75 ug via ORAL
  Filled 2019-11-03 (×2): qty 1

## 2019-11-03 MED ORDER — HYDROMORPHONE HCL 1 MG/ML IJ SOLN
INTRAMUSCULAR | Status: AC
  Filled 2019-11-03: qty 1

## 2019-11-03 MED ORDER — ROCURONIUM BROMIDE 10 MG/ML (PF) SYRINGE
PREFILLED_SYRINGE | INTRAVENOUS | Status: AC
  Filled 2019-11-03: qty 10

## 2019-11-03 MED ORDER — BUPIVACAINE LIPOSOME 1.3 % IJ SUSP
INTRAMUSCULAR | Status: DC | PRN
  Administered 2019-11-03: 20 mL

## 2019-11-03 MED ORDER — OXYCODONE HCL 5 MG PO TABS
ORAL_TABLET | ORAL | Status: AC
  Filled 2019-11-03: qty 2

## 2019-11-03 MED ORDER — SUCCINYLCHOLINE CHLORIDE 200 MG/10ML IV SOSY
PREFILLED_SYRINGE | INTRAVENOUS | Status: DC | PRN
  Administered 2019-11-03: 120 mg via INTRAVENOUS

## 2019-11-03 MED ORDER — PROPOFOL 10 MG/ML IV BOLUS
INTRAVENOUS | Status: DC | PRN
  Administered 2019-11-03: 110 mg via INTRAVENOUS
  Administered 2019-11-03: 50 mg via INTRAVENOUS
  Administered 2019-11-03: 40 mg via INTRAVENOUS

## 2019-11-03 MED ORDER — KETAMINE HCL 50 MG/5ML IJ SOSY
PREFILLED_SYRINGE | INTRAMUSCULAR | Status: AC
  Filled 2019-11-03: qty 10

## 2019-11-03 MED ORDER — METHOCARBAMOL 1000 MG/10ML IJ SOLN
500.0000 mg | Freq: Four times a day (QID) | INTRAVENOUS | Status: DC | PRN
  Filled 2019-11-03: qty 5

## 2019-11-03 MED ORDER — BUPIVACAINE HCL (PF) 0.5 % IJ SOLN
INTRAMUSCULAR | Status: AC
  Filled 2019-11-03: qty 60

## 2019-11-03 MED ORDER — INSULIN ASPART 100 UNIT/ML ~~LOC~~ SOLN
0.0000 [IU] | Freq: Three times a day (TID) | SUBCUTANEOUS | Status: DC
  Administered 2019-11-04 (×2): 15 [IU] via SUBCUTANEOUS
  Administered 2019-11-04 – 2019-11-05 (×2): 11 [IU] via SUBCUTANEOUS
  Administered 2019-11-05: 5 [IU] via SUBCUTANEOUS
  Administered 2019-11-05: 11 [IU] via SUBCUTANEOUS

## 2019-11-03 MED ORDER — HYDROMORPHONE HCL 1 MG/ML IJ SOLN
0.5000 mg | INTRAMUSCULAR | Status: DC | PRN
  Administered 2019-11-04 (×2): 0.5 mg via INTRAVENOUS
  Filled 2019-11-03 (×2): qty 0.5

## 2019-11-03 MED ORDER — FUROSEMIDE 20 MG PO TABS
20.0000 mg | ORAL_TABLET | Freq: Every day | ORAL | Status: DC
  Administered 2019-11-04 – 2019-11-05 (×2): 20 mg via ORAL
  Filled 2019-11-03 (×2): qty 1

## 2019-11-03 MED ORDER — SODIUM CHLORIDE 0.9% FLUSH: 3.0000 mL | INTRAVENOUS | Status: DC | PRN

## 2019-11-03 MED ORDER — SODIUM CHLORIDE 0.9% FLUSH
3.0000 mL | Freq: Two times a day (BID) | INTRAVENOUS | Status: DC
  Administered 2019-11-03 – 2019-11-04 (×3): 3 mL via INTRAVENOUS

## 2019-11-03 SURGICAL SUPPLY — 84 items
ADH SKN CLS APL DERMABOND .7 (GAUZE/BANDAGES/DRESSINGS) ×8
BLADE CLIPPER SURG (BLADE) IMPLANT
CARTRIDGE OIL MAESTRO DRILL (MISCELLANEOUS) ×4 IMPLANT
CLIP NEUROVISION LG (CLIP) ×1 IMPLANT
CNTNR URN SCR LID CUP LEK RST (MISCELLANEOUS) ×2 IMPLANT
CONT SPEC 4OZ STRL OR WHT (MISCELLANEOUS) ×3
COVER BACK TABLE 24X17X13 BIG (DRAPES) IMPLANT
COVER BACK TABLE 60X90IN (DRAPES) ×3 IMPLANT
COVER WAND RF STERILE (DRAPES) ×4 IMPLANT
DECANTER SPIKE VIAL GLASS SM (MISCELLANEOUS) ×4 IMPLANT
DERMABOND ADVANCED (GAUZE/BANDAGES/DRESSINGS) ×4
DERMABOND ADVANCED .7 DNX12 (GAUZE/BANDAGES/DRESSINGS) ×4 IMPLANT
DIFFUSER DRILL AIR PNEUMATIC (MISCELLANEOUS) ×4 IMPLANT
DRAPE C-ARM 42X72 X-RAY (DRAPES) ×6 IMPLANT
DRAPE C-ARMOR (DRAPES) ×6 IMPLANT
DRAPE LAPAROTOMY 100X72X124 (DRAPES) ×6 IMPLANT
DRAPE SURG 17X23 STRL (DRAPES) ×3 IMPLANT
DRSG OPSITE POSTOP 3X4 (GAUZE/BANDAGES/DRESSINGS) ×2 IMPLANT
DRSG OPSITE POSTOP 4X10 (GAUZE/BANDAGES/DRESSINGS) ×1 IMPLANT
DRSG OPSITE POSTOP 4X6 (GAUZE/BANDAGES/DRESSINGS) ×1 IMPLANT
DURAPREP 26ML APPLICATOR (WOUND CARE) ×6 IMPLANT
ELECT REM PT RETURN 9FT ADLT (ELECTROSURGICAL) ×6
ELECTRODE REM PT RTRN 9FT ADLT (ELECTROSURGICAL) ×4 IMPLANT
GAUZE 4X4 16PLY RFD (DISPOSABLE) ×4 IMPLANT
GAUZE SPONGE 4X4 12PLY STRL (GAUZE/BANDAGES/DRESSINGS) ×3 IMPLANT
GLOVE BIO SURGEON STRL SZ 6.5 (GLOVE) ×4 IMPLANT
GLOVE BIO SURGEON STRL SZ8 (GLOVE) ×9 IMPLANT
GLOVE BIOGEL PI IND STRL 7.0 (GLOVE) IMPLANT
GLOVE BIOGEL PI IND STRL 8 (GLOVE) ×4 IMPLANT
GLOVE BIOGEL PI IND STRL 8.5 (GLOVE) ×6 IMPLANT
GLOVE BIOGEL PI INDICATOR 7.0 (GLOVE) ×2
GLOVE BIOGEL PI INDICATOR 8 (GLOVE) ×2
GLOVE BIOGEL PI INDICATOR 8.5 (GLOVE) ×4
GLOVE ECLIPSE 8.0 STRL XLNG CF (GLOVE) ×6 IMPLANT
GLOVE ECLIPSE 8.5 STRL (GLOVE) ×1 IMPLANT
GOWN STRL REUS W/ TWL LRG LVL3 (GOWN DISPOSABLE) IMPLANT
GOWN STRL REUS W/ TWL XL LVL3 (GOWN DISPOSABLE) ×6 IMPLANT
GOWN STRL REUS W/TWL 2XL LVL3 (GOWN DISPOSABLE) ×2 IMPLANT
GOWN STRL REUS W/TWL LRG LVL3 (GOWN DISPOSABLE) ×6
GOWN STRL REUS W/TWL XL LVL3 (GOWN DISPOSABLE) ×9
GUIDEWIRE NITINOL BEVEL TIP (WIRE) ×8 IMPLANT
KIT BASIN OR (CUSTOM PROCEDURE TRAY) ×6 IMPLANT
KIT DILATOR XLIF 5 (KITS) IMPLANT
KIT INFUSE SMALL (Orthopedic Implant) ×1 IMPLANT
KIT POSITION SURG JACKSON T1 (MISCELLANEOUS) ×3 IMPLANT
KIT SURGICAL ACCESS MAXCESS 4 (KITS) ×1 IMPLANT
KIT TURNOVER KIT B (KITS) ×6 IMPLANT
KIT XLIF (KITS) ×1
MARKER SKIN DUAL TIP RULER LAB (MISCELLANEOUS) ×3 IMPLANT
MODULE NVM5 NEXT GEN EMG (NEEDLE) ×1 IMPLANT
MODULUS XLW 10X22X55MM 10 (Spine Construct) ×2 IMPLANT
MODULUS XLW 12X22X55MM 10 (Spine Construct) ×1 IMPLANT
NDL HYPO 21X1.5 SAFETY (NEEDLE) IMPLANT
NDL HYPO 25X1 1.5 SAFETY (NEEDLE) ×4 IMPLANT
NDL I PASS (NEEDLE) IMPLANT
NEEDLE HYPO 21X1.5 SAFETY (NEEDLE) ×3 IMPLANT
NEEDLE HYPO 25X1 1.5 SAFETY (NEEDLE) ×6 IMPLANT
NEEDLE I PASS (NEEDLE) ×6 IMPLANT
NS IRRIG 1000ML POUR BTL (IV SOLUTION) ×5 IMPLANT
OIL CARTRIDGE MAESTRO DRILL (MISCELLANEOUS)
PACK LAMINECTOMY NEURO (CUSTOM PROCEDURE TRAY) ×6 IMPLANT
PAD ARMBOARD 7.5X6 YLW CONV (MISCELLANEOUS) ×9 IMPLANT
PATTIES SURGICAL .5 X.5 (GAUZE/BANDAGES/DRESSINGS) IMPLANT
PATTIES SURGICAL .5 X1 (DISPOSABLE) IMPLANT
PATTIES SURGICAL 1X1 (DISPOSABLE) IMPLANT
PUTTY BONE ATTRAX 10CC STRIP (Putty) ×1 IMPLANT
PUTTY BONE ATTRAX 5CC STRIP (Putty) ×1 IMPLANT
ROD RELINE MAS LORD 5.5X110MM (Rod) ×2 IMPLANT
SCREW LOCK RELINE 5.5 TULIP (Screw) ×8 IMPLANT
SCREW MAS RELINE 6.5X45 POLY (Screw) ×5 IMPLANT
SCREW RELINE MAS POLY 5.5X40 (Screw) ×2 IMPLANT
SCREW RELINE MAS POLY 5.5X45MM (Screw) ×2 IMPLANT
SPONGE LAP 4X18 RFD (DISPOSABLE) IMPLANT
STAPLER SKIN PROX WIDE 3.9 (STAPLE) ×3 IMPLANT
SUT VIC AB 1 CT1 18XBRD ANBCTR (SUTURE) ×6 IMPLANT
SUT VIC AB 1 CT1 8-18 (SUTURE) ×9
SUT VIC AB 2-0 CT1 18 (SUTURE) ×9 IMPLANT
SUT VIC AB 3-0 SH 8-18 (SUTURE) ×9 IMPLANT
SYR 20ML LL LF (SYRINGE) ×1 IMPLANT
SYR TB 1ML 25GX5/8 (SYRINGE) IMPLANT
TOWEL GREEN STERILE (TOWEL DISPOSABLE) ×3 IMPLANT
TOWEL GREEN STERILE FF (TOWEL DISPOSABLE) ×3 IMPLANT
TRAY FOLEY MTR SLVR 16FR STAT (SET/KITS/TRAYS/PACK) ×5 IMPLANT
WATER STERILE IRR 1000ML POUR (IV SOLUTION) ×5 IMPLANT

## 2019-11-03 NOTE — Progress Notes (Signed)
Inpatient Diabetes Program Recommendations  AACE/ADA: New Consensus Statement on Inpatient Glycemic Control (2015)  Target Ranges:  Prepandial:   less than 140 mg/dL      Peak postprandial:   less than 180 mg/dL (1-2 hours)      Critically ill patients:  140 - 180 mg/dL   Lab Results  Component Value Date   GLUCAP 288 (H) 11/03/2019    Review of Glycemic Control Results for Dana Grant, Dana Grant (MRN [id number]) as of 11/03/2019 09:44  Ref. Range 11/03/2019 07:39 11/03/2019 08:44 11/03/2019 09:04  Glucose-Capillary Latest Ref Range: 70 - 99 mg/dL 600 (H) 459 (H) 977 (H)   Diabetes history: Type 2 DM Outpatient Diabetes medications: 70/30 70 units BID Current orders for Inpatient glycemic control: Novolog 10 units x 2  Inpatient Diabetes Program Recommendations:    Glucose trends elevated on presentation, assuming from reduced doses of QPM 70/30 prior to case.  No noted A1C, consider adding.  Consider adding NPH 15 units x 1 (to start now).   After case and assuming patient has a diet order : Novolog 0-15 units TID &HS, NPH 15 units BID and titrate as necessary.  Thanks, Lujean Rave, MSN, RNC-OB Diabetes Coordinator [phone] (8a-5p)

## 2019-11-03 NOTE — Progress Notes (Signed)
Pt's CBG 302 Dr. Maple Hudson made aware.

## 2019-11-03 NOTE — Interval H&P Note (Signed)
History and Physical Interval Note:  11/03/2019 7:28 AM  Dana Grant  has presented today for surgery, with the diagnosis of Lumbar stenosis with neurogenic claudication.  The various methods of treatment have been discussed with the patient and family. After consideration of risks, benefits and other options for treatment, the patient has consented to  Procedure(s): Lumbar 2-3 Lumbar 3-4 Lumbar 4-5 Anterolateral lumbar interbody fusion with percutaneous pedicle screws (N/A) LUMBAR PERCUTANEOUS PEDICLE SCREW 3 LEVEL (N/A) as a surgical intervention.  The patient's history has been reviewed, patient examined, no change in status, stable for surgery.  I have reviewed the patient's chart and labs.  Questions were answered to the patient's satisfaction.     Dorian Heckle

## 2019-11-03 NOTE — Anesthesia Procedure Notes (Signed)
Procedure Name: Intubation Date/Time: 11/03/2019 9:37 AM Performed by: Janace Litten, CRNA Pre-anesthesia Checklist: Patient identified, Emergency Drugs available, Suction available and Patient being monitored Patient Re-evaluated:Patient Re-evaluated prior to induction Oxygen Delivery Method: Circle System Utilized Preoxygenation: Pre-oxygenation with 100% oxygen Induction Type: IV induction Ventilation: Mask ventilation without difficulty Laryngoscope Size: Mac and 3 Grade View: Grade I Tube type: Oral Tube size: 7.0 mm Number of attempts: 1 Airway Equipment and Method: Stylet Placement Confirmation: ETT inserted through vocal cords under direct vision,  positive ETCO2 and breath sounds checked- equal and bilateral Secured at: 21 cm Tube secured with: Tape Dental Injury: Teeth and Oropharynx as per pre-operative assessment

## 2019-11-03 NOTE — Progress Notes (Addendum)
Pharmacy Antibiotic Note  Dana Grant is a 69 y.o. female admitted on 11/03/2019 with lumbar stenosis with neurogenic claudication, lumbar spondylolisthesis, herniated lumbar disc, degenerative disc disease, sagittal imbalance, lumbago, and radiculopathy. Pt is S/P lumbar fusion surgery this afternoon. Pharmacy has been consulted for vancomycin dosing for post-operative surgical prophylaxis.  Pt rec'd vancomycin 1500 mg IV X 1 at 0807 AM today. Per surgeon note, pt has no drains in place post op.  WBC 9.6, afebrile; Scr 1.21, CrCl 54.2 ml/min  Plan: Vancomycin 1250 mg IV X 1 at 2030 this evening (~12 hrs after vancomycin pre-op dose)  Height: 5\' 6"  (167.6 cm) Weight: 109.3 kg (241 lb) IBW/kg (Calculated) : 59.3  Temp (24hrs), Avg:97.7 F (36.5 C), Min:97.6 F (36.4 C), Max:97.9 F (36.6 C)  Recent Labs  Lab 11/01/19 1215  WBC 9.6  CREATININE 1.21*    Estimated Creatinine Clearance: 54.2 mL/min (A) (by C-G formula based on SCr of 1.21 mg/dL (H)).    Allergies  Allergen Reactions  . Codeine Nausea And Vomiting  . Sulfa Antibiotics Nausea And Vomiting  . Demerol [Meperidine]     Pt has no recollection of this  . Lisinopril     Pt has no recollection of this  . Penicillins     Pt has no recollection of this    Microbiology results: 7/1 UCx: pending  6/29 MRSA PCR: negative 6/29 COVID: negative  Thank you for allowing pharmacy to be a part of this patient's care.  7/29, PharmD, BCPS, [facility] Clinical Pharmacist 11/03/2019 8:08 PM

## 2019-11-03 NOTE — Op Note (Signed)
11/03/2019  4:28 PM  PATIENT:  Dana Grant  69 y.o. female  PRE-OPERATIVE DIAGNOSIS:  Lumbar stenosis with neurogenic claudication, lumbar spondylolisthesis, herniated lumbar disc, degenerative disc disease, sagittal imbalance, lumbago, radiculopathy   POST-OPERATIVE DIAGNOSIS:  Lumbar stenosis with neurogenic claudication, lumbar spondylolisthesis, herniated lumbar disc, degenerative disc disease, sagittal imbalance, lumbago, radiculopathy   PROCEDURE:  Procedure(s) with comments: Lumbar Two-Three Lumbar Three-Four Lumbar Four-Five Anterolateral lumbar interbody fusion with percutaneous pedicle screws (N/A) - Lumbar Two-Three Lumbar Three-Four Lumbar Four-Five Anterolateral lumbar interbody fusion with percutaneous pedicle screws LUMBAR PERCUTANEOUS PEDICLE SCREW 3 LEVEL (N/A)  SURGEON:  Surgeon(s) and Role:    * Raenette Sakata, MD - Primary    * Elsner, Henry, MD - Assisting  PHYSICIAN ASSISTANT:   ASSISTANTS: McDaniel, NP   Poteat, RN   ANESTHESIA:   general  EBL:  200 mL   BLOOD ADMINISTERED:none  DRAINS: none   LOCAL MEDICATIONS USED:  MARCAINE    and LIDOCAINE   SPECIMEN:  No Specimen  DISPOSITION OF SPECIMEN:  N/A  COUNTS:  YES  TOURNIQUET:  * No tourniquets in log *  DICTATION: Patient is a 69-year-old with severe spondylosis, stenosis, spondylolisthesis and sagittal imbalance of the lumbar spine. It was elected to take her to surgery for anterolateral decompression and posterior pedicle screw fixation.  The patient has an auto-fusion at L 5 S 1 level.  Procedure: Patient was brought to the operating room and placed in a left lateral decubitus position on the operative table and using orthogonally projected C-arm fluoroscopy the patient was placed so that the L2-3 L3-4 and L 4-5 levels were visualized in AP and lateral plane. The patient was then taped into position. The table was flexed so as to expose the L 45 level as the patient has a high iliac crest. Skin  was marked along with a posterior finger dissection incision. Her flank was then prepped and draped in usual sterile fashion and incisions were made sequentially at L 4-5,  L3-4 and L2-3 levels. Posterior finger dissection was made to enter the retroperitoneal space and then subsequently the probe was inserted into the psoas muscle from the left side initially at the L 4-5 level. After mapping the neural elements were able to dock the probe per the midpoint of this vertebral level and without indications electrically of too close proximity to the neural tissues. Subsequently the self-retaining tractor was.after sequential dilators were utilized the shim was employed and the interspace was cleared of psoas muscle and then incised. A thorough discectomy was performed. Instruments were used to clear the interspace of disc material. An anterior entry with posterior trajectory was performed to avoid neural elements. After thorough discectomy was performed and this was performed using AP and lateral fluoroscopy a 12 lordotic by 55 x 22 mm titanium implant was packed with small BMP and Attrax. This was tamped into position and its position was confirmed on AP and lateral fluoroscopy. Subsequently exposure was performed at the L3-4 level and similar dissection was performed with locking of the self-retaining retractor. At this level were able to place a 10 lordoticby 22 x 55 mm implant packed in a similar fashion. At the L2-3 level were able to place an 12 mm lordotic by 55 x 22 mm implant packed in a similar fashion. Hemostasis was assured the wounds were irrigated and closed with interrupted Vicryl sutures.  Sterile occlusive dressings were placed. Retractor times were:  L 45: 18:30 minutes;  L 34: 19 minutes; L   23: 20:30 minutes.   Patient was then turned into a prone position on the [place] table and using AP and lateral fluoroscopy throughout this portion of the procedure, pedicle screws were placed using Reline  Nuvasive cannulated percutaneous screws. 2 screws were placed at L2 and (5.5 x 40 mm) and 2 at L3 (5.5 x 45), and two at L4 of a similar size (6.5 x 45 mm),  2 at L5 (6.5 x 45 mm). 110 mm rod was then affixed to the screw heads do a separate stab incision and locked down on the screws on the left and 110 mm rod on the right. All connections were then torqued and the Towers were disassembled. The wounds were irrigated and then closed with 1, 2-0 and 3-0 Vicryl stitches. Sterile occlusive dressing was placed with Dermabond. Long-acting Marcaine was injected. The patient was then extubated in the operating room and taken to recovery in stable and satisfactory condition having tolerated her operation well. Counts were correct at the end of the case.  Pelvic Parameters:  Preop: PT 18; PI 62; LL-38; PI-LL +24 ; S VA  PLAN OF CARE: Admit to inpatient   PATIENT DISPOSITION:  PACU - hemodynamically stable.   Delay start of Pharmacological VTE agent (>24hrs) due to surgical blood loss or risk of bleeding: yes

## 2019-11-03 NOTE — Transfer of Care (Signed)
Immediate Anesthesia Transfer of Care Note  Patient: Dana Grant  Procedure(s) Performed: Lumbar Two-Three Lumbar Three-Four Lumbar Four-Five Anterolateral lumbar interbody fusion with percutaneous pedicle screws (N/A Spine Lumbar) LUMBAR PERCUTANEOUS PEDICLE SCREW 3 LEVEL (N/A )  Patient Location: PACU  Anesthesia Type:General  Level of Consciousness: drowsy and responds to stimulation  Airway & Oxygen Therapy: Patient Spontanous Breathing and Patient connected to nasal cannula oxygen  Post-op Assessment: Report given to RN and Post -op Vital signs reviewed and stable  Post vital signs: Reviewed and stable  Last Vitals:  Vitals Value Taken Time  BP 118/59 11/03/19 1536  Temp    Pulse 92 11/03/19 1538  Resp 8 11/03/19 1538  SpO2 95 % 11/03/19 1538  Vitals shown include unvalidated device data.  Last Pain:  Vitals:   11/03/19 0742  TempSrc:   PainSc: 0-No pain         Complications: No complications documented.

## 2019-11-03 NOTE — Brief Op Note (Signed)
11/03/2019  4:28 PM  PATIENT:  Dana Grant  69 y.o. female  PRE-OPERATIVE DIAGNOSIS:  Lumbar stenosis with neurogenic claudication, lumbar spondylolisthesis, herniated lumbar disc, degenerative disc disease, sagittal imbalance, lumbago, radiculopathy   POST-OPERATIVE DIAGNOSIS:  Lumbar stenosis with neurogenic claudication, lumbar spondylolisthesis, herniated lumbar disc, degenerative disc disease, sagittal imbalance, lumbago, radiculopathy   PROCEDURE:  Procedure(s) with comments: Lumbar Two-Three Lumbar Three-Four Lumbar Four-Five Anterolateral lumbar interbody fusion with percutaneous pedicle screws (N/A) - Lumbar Two-Three Lumbar Three-Four Lumbar Four-Five Anterolateral lumbar interbody fusion with percutaneous pedicle screws LUMBAR PERCUTANEOUS PEDICLE SCREW 3 LEVEL (N/A)  SURGEON:  Surgeon(s) and Role:    Maeola Harman, MD - Primary    Barnett Abu, MD - Assisting  PHYSICIAN ASSISTANT:   ASSISTANTSJulien Girt, NP   Poteat, RN   ANESTHESIA:   general  EBL:  200 mL   BLOOD ADMINISTERED:none  DRAINS: none   LOCAL MEDICATIONS USED:  MARCAINE    and LIDOCAINE   SPECIMEN:  No Specimen  DISPOSITION OF SPECIMEN:  N/A  COUNTS:  YES  TOURNIQUET:  * No tourniquets in log *  DICTATION: Patient is a 69 year old with severe spondylosis, stenosis, spondylolisthesis and sagittal imbalance of the lumbar spine. It was elected to take her to surgery for anterolateral decompression and posterior pedicle screw fixation.  The patient has an auto-fusion at L 5 S 1 level.  Procedure: Patient was brought to the operating room and placed in a left lateral decubitus position on the operative table and using orthogonally projected C-arm fluoroscopy the patient was placed so that the L2-3 L3-4 and L 4-5 levels were visualized in AP and lateral plane. The patient was then taped into position. The table was flexed so as to expose the L 45 level as the patient has a high iliac crest. Skin  was marked along with a posterior finger dissection incision. Her flank was then prepped and draped in usual sterile fashion and incisions were made sequentially at L 4-5,  L3-4 and L2-3 levels. Posterior finger dissection was made to enter the retroperitoneal space and then subsequently the probe was inserted into the psoas muscle from the left side initially at the L 4-5 level. After mapping the neural elements were able to dock the probe per the midpoint of this vertebral level and without indications electrically of too close proximity to the neural tissues. Subsequently the self-retaining tractor was.after sequential dilators were utilized the shim was employed and the interspace was cleared of psoas muscle and then incised. A thorough discectomy was performed. Instruments were used to clear the interspace of disc material. An anterior entry with posterior trajectory was performed to avoid neural elements. After thorough discectomy was performed and this was performed using AP and lateral fluoroscopy a 12 lordotic by 55 x 22 mm titanium implant was packed with small BMP and Attrax. This was tamped into position and its position was confirmed on AP and lateral fluoroscopy. Subsequently exposure was performed at the L3-4 level and similar dissection was performed with locking of the self-retaining retractor. At this level were able to place a 10 lordoticby 22 x 55 mm implant packed in a similar fashion. At the L2-3 level were able to place an 12 mm lordotic by 55 x 22 mm implant packed in a similar fashion. Hemostasis was assured the wounds were irrigated and closed with interrupted Vicryl sutures.  Sterile occlusive dressings were placed. Retractor times were:  L 45: 18:30 minutes;  L 34: 19 minutes; L  23: 20:30 minutes.   Patient was then turned into a prone position on the [place] table and using AP and lateral fluoroscopy throughout this portion of the procedure, pedicle screws were placed using Reline  Nuvasive cannulated percutaneous screws. 2 screws were placed at L2 and (5.5 x 40 mm) and 2 at L3 (5.5 x 45), and two at L4 of a similar size (6.5 x 45 mm),  2 at L5 (6.5 x 45 mm). 110 mm rod was then affixed to the screw heads do a separate stab incision and locked down on the screws on the left and 110 mm rod on the right. All connections were then torqued and the Towers were disassembled. The wounds were irrigated and then closed with 1, 2-0 and 3-0 Vicryl stitches. Sterile occlusive dressing was placed with Dermabond. Long-acting Marcaine was injected. The patient was then extubated in the operating room and taken to recovery in stable and satisfactory condition having tolerated her operation well. Counts were correct at the end of the case.  Pelvic Parameters:  Preop: PT 18; PI 62; LL-38; PI-LL +24 ; S VA  PLAN OF CARE: Admit to inpatient   PATIENT DISPOSITION:  PACU - hemodynamically stable.   Delay start of Pharmacological VTE agent (>24hrs) due to surgical blood loss or risk of bleeding: yes

## 2019-11-04 ENCOUNTER — Encounter (HOSPITAL_COMMUNITY): Payer: Self-pay | Admitting: Neurosurgery

## 2019-11-04 ENCOUNTER — Other Ambulatory Visit: Payer: Self-pay

## 2019-11-04 LAB — GLUCOSE, CAPILLARY
Glucose-Capillary: 367 mg/dL — ABNORMAL HIGH (ref 70–99)
Glucose-Capillary: 380 mg/dL — ABNORMAL HIGH (ref 70–99)
Glucose-Capillary: 339 mg/dL — ABNORMAL HIGH (ref 70–99)
Glucose-Capillary: 428 mg/dL — ABNORMAL HIGH (ref 70–99)
Glucose-Capillary: 341 mg/dL — ABNORMAL HIGH (ref 70–99)

## 2019-11-04 LAB — URINE CULTURE: Culture: NO GROWTH

## 2019-11-04 MED ORDER — HYDROXYZINE HCL 50 MG/ML IM SOLN
50.0000 mg | Freq: Four times a day (QID) | INTRAMUSCULAR | Status: DC | PRN
  Administered 2019-11-04: 50 mg via INTRAMUSCULAR
  Filled 2019-11-04: qty 1

## 2019-11-04 MED ORDER — NALOXONE HCL 0.4 MG/ML IJ SOLN
INTRAMUSCULAR | Status: AC
  Administered 2019-11-04: 0.2 mg/mL
  Filled 2019-11-04: qty 1

## 2019-11-04 MED ORDER — INSULIN NPH (HUMAN) (ISOPHANE) 100 UNIT/ML ~~LOC~~ SUSP
30.0000 [IU] | Freq: Two times a day (BID) | SUBCUTANEOUS | Status: DC
  Administered 2019-11-04 – 2019-11-05 (×2): 30 [IU] via SUBCUTANEOUS
  Filled 2019-11-04 (×2): qty 10

## 2019-11-04 MED ORDER — KETOROLAC TROMETHAMINE 15 MG/ML IJ SOLN: 15.0000 mg | Freq: Three times a day (TID) | INTRAMUSCULAR | Status: DC | PRN

## 2019-11-04 MED ORDER — KETOROLAC TROMETHAMINE 15 MG/ML IJ SOLN
15.0000 mg | Freq: Four times a day (QID) | INTRAMUSCULAR | Status: DC | PRN
  Administered 2019-11-05: 15 mg via INTRAVENOUS
  Filled 2019-11-04: qty 1

## 2019-11-04 MED ORDER — ACETAMINOPHEN 10 MG/ML IV SOLN
1000.0000 mg | Freq: Four times a day (QID) | INTRAVENOUS | Status: DC | PRN
  Administered 2019-11-05: 1000 mg via INTRAVENOUS
  Filled 2019-11-04: qty 100

## 2019-11-04 MED ORDER — PANTOPRAZOLE SODIUM 40 MG PO TBEC
40.0000 mg | DELAYED_RELEASE_TABLET | Freq: Every day | ORAL | Status: DC
  Filled 2019-11-04: qty 1

## 2019-11-04 NOTE — Significant Event (Signed)
Rapid Response Event Note  Dr. Venetia Maxon updated about the patient's status. Received verbal orders for APAP IV and Toradol IV for pain management   Delwyn Scoggin R

## 2019-11-04 NOTE — Telephone Encounter (Signed)
Attempted ICM referral call for intro and left message for return call.   11/01/2019 Corvue impedance suggesting normal.

## 2019-11-04 NOTE — Progress Notes (Signed)
cpap brought to PTs room, she said she didn't want it at this time. PT instructed to call if she changed her mind.

## 2019-11-04 NOTE — Progress Notes (Signed)
Noted during shift change that pt breathing  Slow and shallow,lips purple,responsive to touch but will go back to sleep right away. O2 sat down to 79% at RA , went up to 91% at 3 L O2 [place]. BP 101/46, HR 103 CBG 421. Barely able to carry conversation due to decreased mentation. RR notified.

## 2019-11-04 NOTE — Progress Notes (Signed)
Subjective: Patient reports moderate incisional pain but her pre op pain is rapidly improving. She is sitting up on the side of the bed eating breakfast with no difficulty. She reports ambulating last night with some difficulties due to incisional pain and does not feel that she is ready for discharge.    Objective: Vital signs in last 24 hours: Temp:  [97.6 F (36.4 C)-98.7 F (37.1 C)] 98.7 F (37.1 C) (07/02 0348) Pulse Rate:  [92-110] 104 (07/02 0348) Resp:  [7-27] 18 (07/02 0348) BP: (108-152)/(51-89) 146/74 (07/02 0348) SpO2:  [91 %-100 %] 93 % (07/02 0348)  Intake/Output from previous day: 07/01 0701 - 07/02 0700 In: 1800 [I.V.:1800] Out: 1840 [Urine:1640; Blood:200] Intake/Output this shift: No intake/output data recorded.  Physical Exam: Patient is A/O X3, conversant and in good spirits. MAEW with good symmetric strength. Dressings CDI with no erythema, drainage, or swelling.   Lab Results: Recent Labs    11/01/19 1215  WBC 9.6  HGB 15.1*  HCT 43.8  PLT 240   BMET Recent Labs    11/01/19 1215  NA 141  K 3.7  CL 103  CO2 28  GLUCOSE 144*  BUN 21  CREATININE 1.21*  CALCIUM 9.5    Studies/Results: DG Lumbar Spine 2-3 Views  Result Date: 11/03/2019 CLINICAL DATA:  L2-L5 fusion EXAM: DG C-ARM 1-60 MIN; LUMBAR SPINE - 2-3 VIEW COMPARISON:  CT 07/18/2019 FINDINGS: Multiple intraoperative spot images demonstrate changes of 3 level fusion from L2-L5. No hardware bony complicating feature. IMPRESSION: L2-L5 fusion.  No complicating feature. Electronically Signed   By: Charlett Nose M.D.   On: 11/03/2019 16:15   DG C-Arm 1-60 Min  Result Date: 11/03/2019 CLINICAL DATA:  L2-L5 fusion EXAM: DG C-ARM 1-60 MIN; LUMBAR SPINE - 2-3 VIEW COMPARISON:  CT 07/18/2019 FINDINGS: Multiple intraoperative spot images demonstrate changes of 3 level fusion from L2-L5. No hardware bony complicating feature. IMPRESSION: L2-L5 fusion.  No complicating feature. Electronically Signed    By: Charlett Nose M.D.   On: 11/03/2019 16:15   DG OR LOCAL ABDOMEN  Result Date: 11/03/2019 CLINICAL DATA:  Patient status post lumbar fusion. Question retained instrument. EXAM: OR LOCAL ABDOMEN COMPARISON:  None. FINDINGS: The examination is limited. In particular, the right side of the abdomen is difficult to visualize. No unexpected foreign body is seen IMPRESSION: Limited examination.  No foreign body is identified. Electronically Signed   By: Drusilla Kanner M.D.   On: 11/03/2019 12:55    Assessment/Plan: Continue working on pain control and ambulation. Patient does not feel ready for discharge today. Will transfer her to [facility] and will reassess readiness for discharge tomorrow morning. No PureWick pease. Encourage patient to ambulate and get out of bed. Continue back brace when OOB.    LOS: 1 day    Dorian Heckle, MD 11/04/2019, 8:00 AM

## 2019-11-04 NOTE — Progress Notes (Signed)
Results for KAAREN, NASS (MRN [id number]) as of 11/04/2019 09:00  Ref. Range 11/03/2019 16:07 11/03/2019 17:42 11/03/2019 19:30 11/03/2019 21:28 11/04/2019 05:55  Glucose-Capillary Latest Ref Range: 70 - 99 mg/dL 616 (H) 837 (H) 290 (H) 300 (H) 367 (H)  Noted that blood sugars continue to be greater than 180 mg/dl.  Recommend adding NPH insulin 15 units BID if blood sugars continue to be elevated. Continue Novolog MODERATE correction scale as ordered.   Smith Mince RN BSN CDE Diabetes Coordinator Pager: [phone]  8am-5pm

## 2019-11-04 NOTE — Progress Notes (Signed)
Patient continues to have moderate incisional pain but has improved since this morning. Her pre op is much improved. She reports working with PT and sitting up in the chair for about 45 minutes. Some complaints of nausea but have now resolved with antiemetic. She is awaiting transfer to [facility]. Patient is MAEW with good symmetric strength. Dressings CDI with no erythema, drainage, or swelling.

## 2019-11-04 NOTE — Significant Event (Signed)
Rapid Response Event Note  Overview: Decreased LOC/confusion  Initial Focused Assessment: I was notified by nursing staff of pt with decreased LOC and difficult to keep awake. Upon arrival, pt was drowsy, oriented x2, disoriented to place and the year. She also had some dysarthria. Generalized weakness but equal in strength bilaterally. Pupils were 2 bilaterally and reactive. Pt is also now oxygen dependent on 3L when previously on RA. Oxycodone 10mg  PO x2 doses today (last at 1600) and Hydromorphone 0.5mg  IV x2 doses (last at 1800) as well as robaxin,  neurotin and xanax. Narcan IV 0.2mg  administered and pt became awake and oriented x4 within 5 mins. Oxygen saturations increased to 97%. Dr. notified by my partner Puja RN and orders received.   1938-HR 107, 128/66(83), RR 16, sats 97% on 3LNC   Interventions: -Narcan 0.2mg  IV for desired effect  Plan of Care (if not transferred): -Notify primary service for further orders such as adjunct therapy for pain control -If pt becomes lethargic again, reassess need for Narcan due to short half life -Notify primary service and/or RRRN for further assistance if needed.   Event Summary: Call received 1918 Arrived at call 1925 Call ended 1950  1951

## 2019-11-04 NOTE — Progress Notes (Signed)
Pt admitted to unit A&O x4. Pt is drowsy. Pt states she does not have pain. Pt presents with 3L O2 via [place] and PIV. Call light within reach. Bed in lowest position. Will continue to monitor. Report received from [place], [place].  Rosey Bath

## 2019-11-04 NOTE — Evaluation (Signed)
Physical Therapy Evaluation Patient Details Name: Dana Grant MRN: [id number] DOB: Mar 13, 1951 Today's Date: 11/04/2019   History of Present Illness  Pt is a 69 y/o female s/p L2-L5 ALIF. PMH including but not limited to HTN, DM and has a pacemaker.  Clinical Impression  Pt standing in room with OT finishing up their session and willing to participate in PT session. Prior to admission, pt reported that she ambulated with use of a RW and was independent with ADLs. Pt lives with her spouse in a two level home (able to live on main level) with a few steps to enter the home. At the time of evaluation, pt significantly limited overall with mobility secondary to pain, nausea and emesis at end of session. Pt's spouse was present throughout and reported that pt typically has nausea with general anesthesia. She was only able to tolerate ambulating a very short distance in her room with use of her RW and min guard for safety. She requested to remain sitting upright at EOB at end of session with husband still present. Pt would continue to benefit from skilled physical therapy services at this time while admitted and after d/c to address the below listed limitations in order to improve overall safety and independence with functional mobility.     Follow Up Recommendations Home health PT;Supervision/Assistance - 24 hour    Equipment Recommendations  None recommended by PT    Recommendations for Other Services       Precautions / Restrictions Precautions Precautions: Back;Fall Precaution Booklet Issued: Yes (comment) Precaution Comments: reviewed back precautions with pt throughout Required Braces or Orthoses: Spinal Brace Spinal Brace: Thoracolumbosacral orthotic;Applied in sitting position Restrictions Weight Bearing Restrictions: No      Mobility  Bed Mobility               General bed mobility comments: pt requesting to remain EOB at end of session secondary to significant  nausea  Transfers Overall transfer level: Needs assistance Equipment used: Rolling walker (2 wheeled) Transfers: Sit to/from Stand Sit to Stand: Min assist         General transfer comment: min A for stability and cueing for safe hand placements and technique  Ambulation/Gait Ambulation/Gait assistance: Min guard Gait Distance (Feet): 10 Feet Assistive device: Rolling walker (2 wheeled) Gait Pattern/deviations: Step-to pattern;Decreased step length - right;Decreased step length - left;Decreased stride length;Shuffle;Trunk flexed Gait velocity: greatly decreased   General Gait Details: pt with very slow, unsteady gait with use of RW; no overt LOB but did not tolerate very much at all secondary to pain and significant nausea.   Stairs            Wheelchair Mobility    Modified Rankin (Stroke Patients Only)       Balance Overall balance assessment: Needs assistance Sitting-balance support: Feet supported;No upper extremity supported Sitting balance-Leahy Scale: Fair     Standing balance support: During functional activity Standing balance-Leahy Scale: Poor                               Pertinent Vitals/Pain Pain Assessment: Faces Faces Pain Scale: Hurts whole lot Pain Location: incisional and L flank Pain Descriptors / Indicators: Discomfort;Grimacing;Guarding;Moaning;Operative site guarding Pain Intervention(s): Monitored during session;Repositioned    Home Living Family/patient expects to be discharged to:: Private residence Living Arrangements: Spouse/significant other Available Help at Discharge: Family;Available PRN/intermittently;Available 24 hours/day Type of Home: House Home Access: Stairs to enter Entrance  Stairs-Rails: Can reach both Entrance Stairs-Number of Steps: 3 Home Layout: Two level;Able to live on main level with bedroom/bathroom Home Equipment: Walker - 2 wheels;Hand held shower head;Grab bars - tub/shower;Grab bars -  toilet;Cane - single point Additional Comments: Pt and husband report they have been lvivng on the first floor since pt's pain is so bad    Prior Function Level of Independence: Independent with assistive device(s)         Comments: Not driving able to complete all ADLs with independence and using RW     Hand Dominance   Dominant Hand: Right    Extremity/Trunk Assessment   Upper Extremity Assessment Upper Extremity Assessment: Defer to OT evaluation;Generalized weakness    Lower Extremity Assessment Lower Extremity Assessment: Generalized weakness    Cervical / Trunk Assessment Cervical / Trunk Assessment: Other exceptions Cervical / Trunk Exceptions: s/p back surgery  Communication   Communication: No difficulties  Cognition Arousal/Alertness: Awake/alert Behavior During Therapy: Flat affect Overall Cognitive Status: Impaired/Different from baseline Area of Impairment: Problem solving;Safety/judgement                   Current Attention Level: Sustained   Following Commands: Follows one step commands with increased time Safety/Judgement: Decreased awareness of safety;Decreased awareness of deficits Awareness: Emergent Problem Solving: Slow processing;Decreased initiation;Difficulty sequencing;Requires verbal cues General Comments: Pt with decreased awareness of safety and deficits. Slow processing and slow speech. Pt huisband's present and answering most questions for pt.       General Comments General comments (skin integrity, edema, etc.): Patient in intense pain and having a hard time tolerating back brace    Exercises     Assessment/Plan    PT Assessment Patient needs continued PT services  PT Problem List Decreased strength;Decreased range of motion;Decreased activity tolerance;Decreased mobility;Decreased coordination;Decreased balance;Decreased knowledge of use of DME;Decreased safety awareness;Decreased knowledge of precautions;Pain       PT  Treatment Interventions DME instruction;Gait training;Stair training;Functional mobility training;Therapeutic activities;Balance training;Therapeutic exercise;Neuromuscular re-education;Patient/family education    PT Goals (Current goals can be found in the Care Plan section)  Acute Rehab PT Goals Patient Stated Goal: "home tomorrow" PT Goal Formulation: With patient/family Time For Goal Achievement: 11/18/19 Potential to Achieve Goals: Good    Frequency Min 5X/week   Barriers to discharge        Co-evaluation               AM-PAC PT "6 Clicks" Mobility  Outcome Measure Help needed turning from your back to your side while in a flat bed without using bedrails?: A Little Help needed moving from lying on your back to sitting on the side of a flat bed without using bedrails?: A Little Help needed moving to and from a bed to a chair (including a wheelchair)?: A Little Help needed standing up from a chair using your arms (e.g., wheelchair or bedside chair)?: A Little Help needed to walk in hospital room?: A Little Help needed climbing 3-5 steps with a railing? : A Lot 6 Click Score: 17    End of Session Equipment Utilized During Treatment: Back brace Activity Tolerance: Patient limited by pain;Other (comment) (limited by significant nausea and emesis) Patient left: in bed;with call bell/phone within reach;with family/visitor present;Other (comment) (sitting EOB) Nurse Communication: Mobility status PT Visit Diagnosis: Other abnormalities of gait and mobility (R26.89);Pain Pain - part of body:  (back)    Time: 1093-2355 PT Time Calculation (min) (ACUTE ONLY): 12 min   Charges:  PT Evaluation $PT Eval Moderate Complexity: 1 Mod          Ginette Pitman, PT, DPT  Acute Rehabilitation Services Pager [phone] Office [phone]    Alessandra Bevels Hesper Venturella 11/04/2019, 10:57 AM

## 2019-11-04 NOTE — Evaluation (Signed)
Occupational Therapy Evaluation Patient Details Name: Dana Grant MRN: [id number] DOB: October 09, 1950 Today's Date: 11/04/2019    History of Present Illness Pt is a 69 y.o. female with PMH of HTN, DM type 2, hypothyroidism, obesity, major depressive disorder, anxiety, HLD, pacemaker presenting with lumbosacral and L greater than R leg pain. Pt s/p L2-L5 Anterolateral lumbar interbody fusion with percutaneous pedicle screws  on 11/03/2019.   Clinical Impression   PTA pt reports being independent with ADLs with use of walker for ambulation and not driving. Pt was admitted for above and treated for problem list below. Requires Min A-Max A with verbal cues for ADLs to maintain back precautions. Requires Min A with transfers and ambulation with verbal cues for safety with walker. Reports she has been living on first floor of house since significant pain started. Husband present for eval and confirmed 24/7 support at home for pt. During session, pt expressed increased pain with brace application and ambulation. Limited eval due to pain - education on grooming at sink and AE to be given next session. Believe pt would benefit from skilled OT services acutely. If condition does not improve, would recommend skilled-OT services at the [facility] level to increase safety and independence with ADLs.    Follow Up Recommendations  Supervision/Assistance - 24 hour    Equipment Recommendations  3 in 1 bedside commode       Precautions / Restrictions Precautions Precautions: Back;Fall Precaution Booklet Issued: Yes (comment) Precaution Comments: Reviewed BLT Required Braces or Orthoses: Spinal Brace Spinal Brace: Thoracolumbosacral orthotic;Applied in sitting position Restrictions Weight Bearing Restrictions: No      Mobility Bed Mobility               General bed mobility comments: Pt sitting EOB upon arrival  Transfers Overall transfer level: Needs assistance Equipment used: Rolling walker (2  wheeled) Transfers: Sit to/from Stand Sit to Stand: Min assist         General transfer comment: Min A for boost and steadying with walker    Balance Overall balance assessment: Needs assistance Sitting-balance support: Feet supported;No upper extremity supported Sitting balance-Leahy Scale: Fair     Standing balance support: No upper extremity supported;During functional activity Standing balance-Leahy Scale: Fair                             ADL either performed or assessed with clinical judgement   ADL Overall ADL's : Needs assistance/impaired     Grooming: Standing;Min guard;Cueing for UE precautions Grooming Details (indicate cue type and reason): grooming with min guard for balance and verbal cues to maintain back precautions Upper Body Bathing: Minimal assistance;Sitting;Cueing for safety Upper Body Bathing Details (indicate cue type and reason): Min A due to weakness in UE and maintaining back precautions Lower Body Bathing: Minimal assistance;Sitting/lateral leans;Adhering to back precautions;Cueing for back precautions Lower Body Bathing Details (indicate cue type and reason): Min A with assisting with LB washing Upper Body Dressing : Minimal assistance;Sitting;Cueing for safety Upper Body Dressing Details (indicate cue type and reason): Min A to don brace EOB Lower Body Dressing: Cueing for back precautions;Sit to/from stand;Maximal assistance Lower Body Dressing Details (indicate cue type and reason): Max A with donning shoes and socks and assisting with threading legs through pants. Pt reports she will have assist at home to help Toilet Transfer: Minimal assistance;Grab bars;Regular Toilet;Cueing for safety Toilet Transfer Details (indicate cue type and reason): Min A with toilet transfer cueing  for back precautions Toileting- Clothing Manipulation and Hygiene: Cueing for back precautions;Sitting/lateral lean;Minimal assistance Toileting - Clothing  Manipulation Details (indicate cue type and reason): Min A with cues to maintain back precautions   Tub/Shower Transfer Details (indicate cue type and reason): deferred due to safety Functional mobility during ADLs: Minimal assistance;Rolling walker;Cueing for safety General ADL Comments: Min A with cueing for safety to maintain back precautions     Vision Baseline Vision/History: No visual deficits              Pertinent Vitals/Pain Pain Assessment: Faces Faces Pain Scale: Hurts whole lot Pain Location: incisional and L flank Pain Descriptors / Indicators: Discomfort;Grimacing;Guarding;Moaning;Operative site guarding Pain Intervention(s): Limited activity within patient's tolerance;Monitored during session     Hand Dominance Right   Extremity/Trunk Assessment Upper Extremity Assessment Upper Extremity Assessment: Generalized weakness   Lower Extremity Assessment Lower Extremity Assessment: Defer to PT evaluation   Cervical / Trunk Assessment Cervical / Trunk Assessment: Other exceptions Cervical / Trunk Exceptions: s/p back surgery   Communication Communication Communication: Expressive difficulties (slow speech)   Cognition Arousal/Alertness: Awake/alert Behavior During Therapy: Flat affect Overall Cognitive Status: Impaired/Different from baseline Area of Impairment: Awareness;Problem solving;Following commands;Safety/judgement;Attention                   Current Attention Level: Sustained   Following Commands: Follows one step commands with increased time Safety/Judgement: Decreased awareness of safety;Decreased awareness of deficits Awareness: Emergent Problem Solving: Slow processing;Decreased initiation;Difficulty sequencing;Requires verbal cues General Comments: Pt with decreased awareness of safety and deficits. Slow processing and slow speech. Pt huisband's present and answering most questions for pt.    General Comments  Patient in intense pain and  having a hard time tolerating back brace            Home Living Family/patient expects to be discharged to:: Private residence Living Arrangements: Spouse/significant other Available Help at Discharge: Family;Available PRN/intermittently;Available 24 hours/day Type of Home: House Home Access: Stairs to enter [employer] of Steps: 3 Entrance Stairs-Rails: Can reach both Home Layout: Two level;Able to live on main level with bedroom/bathroom     Bathroom Shower/Tub: Tub/shower unit   Bathroom Toilet: Handicapped height Bathroom Accessibility: Yes   Home Equipment: Environmental consultant - 2 wheels;Hand held shower head;Grab bars - tub/shower;Grab bars - toilet;Cane - single point   Additional Comments: Pt and husband report they have been lvivng on the first floor since pt's pain is so bad      Prior Functioning/Environment Level of Independence: Independent with assistive device(s)        Comments: Not driving able to complete all ADLs with independence and using RW        OT Problem List: Decreased strength;Decreased activity tolerance;Impaired balance (sitting and/or standing);Decreased safety awareness;Decreased knowledge of use of DME or AE;Decreased knowledge of precautions;Obesity;Pain      OT Treatment/Interventions: Self-care/ADL training;Energy conservation;DME and/or AE instruction;Therapeutic activities;Patient/family education;Balance training    OT Goals(Current goals can be found in the care plan section) Acute Rehab OT Goals Patient Stated Goal: go home and decrease pain OT Goal Formulation: With patient Time For Goal Achievement: 11/18/19 Potential to Achieve Goals: Good  OT Frequency: Min 2X/week    AM-PAC OT "6 Clicks" Daily Activity     Outcome Measure Help from another person eating meals?: None Help from another person taking care of personal grooming?: A Little Help from another person toileting, which includes using toliet, bedpan, or urinal?: A  Little Help from another person  bathing (including washing, rinsing, drying)?: A Little Help from another person to put on and taking off regular upper body clothing?: A Little Help from another person to put on and taking off regular lower body clothing?: A Lot 6 Click Score: 18   End of Session Equipment Utilized During Treatment: Gait belt;Rolling walker Nurse Communication: Mobility status;Other (comment) (increased pain)  Activity Tolerance: Patient limited by pain Patient left: Other (comment) (with PT)  OT Visit Diagnosis: Unsteadiness on feet (R26.81);Repeated falls (R29.6);Muscle weakness (generalized) (M62.81);Other symptoms and signs involving the nervous system (R29.898);Pain Pain - part of body:  (incisional and L flank)                Time: 6381-7711 OT Time Calculation (min): 35 min Charges:  OT General Charges $OT Visit: 1 Visit OT Evaluation $OT Eval Moderate Complexity: 1 Mod OT Treatments $Self Care/Home Management : 8-22 mins  Kerissa Coia/OTS   Luvern Mischke 11/04/2019, 10:35 AM

## 2019-11-04 NOTE — Progress Notes (Signed)
Pt transferred to [facility]. Report given to [facility]. All belongings sent w pt. Spouse aware of transfer.

## 2019-11-05 LAB — GLUCOSE, CAPILLARY
Glucose-Capillary: 304 mg/dL — ABNORMAL HIGH (ref 70–99)
Glucose-Capillary: 329 mg/dL — ABNORMAL HIGH (ref 70–99)
Glucose-Capillary: 245 mg/dL — ABNORMAL HIGH (ref 70–99)

## 2019-11-05 MED ORDER — HYDROCODONE-ACETAMINOPHEN 5-325 MG PO TABS: 1.0000 | ORAL_TABLET | ORAL | 0 refills | Status: DC | PRN

## 2019-11-05 MED ORDER — METHOCARBAMOL 500 MG PO TABS: 500.0000 mg | ORAL_TABLET | Freq: Four times a day (QID) | ORAL | 2 refills | Status: DC | PRN

## 2019-11-05 NOTE — TOC Transition Note (Addendum)
Transition of Care [facility]) - CM/SW Discharge Note   Patient Details  Name: Dana Grant MRN: [id number] Date of Birth: 28-Sep-1950  [facility]) CM/SW Contact:  Deveron Furlong, RN 11/05/2019, 4:32 PM   Clinical Narrative:    Patient to d/c home.  Spoke at length to patient and husband regarding HH agencies and Medicare rated list.  Patient has been going to outpatient PT at [facility] therapy and wishes to continue there.    Clinicals faxed.  Patient doesn't feel she needs a 3n1 as her toilets are handicap equipped.    Final next level of care: OP Rehab Barriers to Discharge: No Barriers Identified

## 2019-11-05 NOTE — Discharge Instructions (Signed)
Discharge Instructions  Slowly increase your activity back to normal, do not lift more than 20 pounds until discussed with Dr. Venetia Maxon at follow up.  Okay to remove your dressings and shower on the day of discharge. Use regular soap and water and try to be gentle when cleaning your incision.   Follow up with Dr. Venetia Maxon in 2 weeks after discharge. If you do not already have a discharge appointment, please call his office at [phone] to schedule a follow up appointment. If you have any concerns or questions, please call the office and let us know.

## 2019-11-05 NOTE — Discharge Summary (Signed)
Discharge Summary  Date of Admission: 11/03/2019  Date of Discharge: 11/05/19  Attending Physician: Maeola Harman, MD  Hospital Course: Patient was admitted following an uncomplicated 3 level XLIF and percutaneous screw placement. She was recovered in PACU and transferred to [facility]. Her hospital course was uncomplicated and the patient was discharged home on 11/05/19. She will follow up in clinic with Dr. Venetia Maxon in 2 weeks.  Neurologic exam at discharge:  AOx3, PERRL, EOMI, FS, TM Strength 5/5 x4 except for pain limited hip flexion, SILTx4  Discharge diagnosis: Lumbar spondylosis  Jadene Pierini, MD 11/05/19 4:02 PM

## 2019-11-05 NOTE — Progress Notes (Signed)
Physical Therapy Treatment Patient Details Name: Dana Grant MRN: [id number] DOB: [birth year] Today's Date: 11/05/2019    History of Present Illness Pt is a 69 y/o female s/p L2-L5 ALIF. PMH including but not limited to HTN, DM and has a pacemaker.    PT Comments    Pt tolerates treatment well this session with significant progression of activity tolerance and mobility. Pt appears to be in less pain today and demonstrates improved transfer quality and power. PT provides cues for device management to improve posture and safety. Pt is encouraged to ambulate multiple times per day to aide in improving activity tolerance. PT continues to recommend HHPT and supervision in the home setting.   Follow Up Recommendations  Home health PT;Supervision/Assistance - 24 hour     Equipment Recommendations  None recommended by PT    Recommendations for Other Services       Precautions / Restrictions Precautions Precautions: Back;Fall Precaution Booklet Issued: Yes (comment) Precaution Comments: reviewed back precautions with pt throughout Required Braces or Orthoses: Spinal Brace Spinal Brace: Thoracolumbosacral orthotic;Applied in sitting position Restrictions Weight Bearing Restrictions: No    Mobility  Bed Mobility               General bed mobility comments: pt received and left in recliner  Transfers Overall transfer level: Needs assistance Equipment used: Rolling walker (2 wheeled) Transfers: Sit to/from Stand Sit to Stand: Min guard            Ambulation/Gait Ambulation/Gait assistance: Min guard Gait Distance (Feet): 60 Feet (additional trial of 40') Assistive device: Rolling walker (2 wheeled) Gait Pattern/deviations: Step-to pattern;Trunk flexed Gait velocity: reduced Gait velocity interpretation: <1.8 ft/sec, indicate of risk for recurrent falls General Gait Details: pt requires cues to maintain RW closer to BOS to limit trunk flexion and forward  lean   Stairs             Wheelchair Mobility    Modified Rankin (Stroke Patients Only)       Balance Overall balance assessment: Needs assistance Sitting-balance support: Bilateral upper extremity supported;Feet supported Sitting balance-Leahy Scale: Poor     Standing balance support: Bilateral upper extremity supported Standing balance-Leahy Scale: Poor                              Cognition Arousal/Alertness: Awake/alert Behavior During Therapy: WFL for tasks assessed/performed Overall Cognitive Status: Impaired/Different from baseline Area of Impairment: Problem solving                             Problem Solving: Slow processing        Exercises      General Comments General comments (skin integrity, edema, etc.): VSS on RA      Pertinent Vitals/Pain Pain Assessment: Faces Faces Pain Scale: Hurts even more Pain Location: back Pain Descriptors / Indicators: Grimacing Pain Intervention(s): Monitored during session    Home Living                      Prior Function            PT Goals (current goals can now be found in the care plan section) Acute Rehab PT Goals Patient Stated Goal: to go home, also to get ice cream Progress towards PT goals: Progressing toward goals    Frequency    Min 5X/week  PT Plan Current plan remains appropriate    Co-evaluation              AM-PAC PT "6 Clicks" Mobility   Outcome Measure  Help needed turning from your back to your side while in a flat bed without using bedrails?: A Little Help needed moving from lying on your back to sitting on the side of a flat bed without using bedrails?: A Little Help needed moving to and from a bed to a chair (including a wheelchair)?: A Little Help needed standing up from a chair using your arms (e.g., wheelchair or bedside chair)?: A Little Help needed to walk in hospital room?: A Little Help needed climbing 3-5 steps with a  railing? : A Lot 6 Click Score: 17    End of Session Equipment Utilized During Treatment: Back brace Activity Tolerance: Patient tolerated treatment well Patient left: in chair;with call bell/phone within reach;with family/visitor present Nurse Communication: Mobility status PT Visit Diagnosis: Other abnormalities of gait and mobility (R26.89);Pain Pain - part of body:  (back)     Time: 8110-3159 PT Time Calculation (min) (ACUTE ONLY): 24 min  Charges:  $Gait Training: 23-37 mins                     Arlyss Gandy, PT, DPT Acute Rehabilitation Pager: [phone]    Arlyss Gandy 11/05/2019, 12:26 PM

## 2019-11-05 NOTE — Progress Notes (Signed)
Occupational Therapy Treatment Patient Details Name: Dana Grant MRN: [id number] DOB: 05-27-1950 Today's Date: 11/05/2019    History of present illness Pt is a 69 y/o female s/p L2-L5 ALIF. PMH including but not limited to HTN, DM and has a pacemaker.   OT comments  Pt progressing well toward stated OT goals. Noted significant event last PM with administration of narcan. Focused session on continued LB BADL training, transfer training, and DME/AE education with pt and husband. Educated pt and spouse on various options of shower seats for the home with a tub shower. Also educated pt on use of long handled reacher for LB dressing. She states she would like her husband to assist, but husband continues to work and pt will be alone throughout the day and need additional strategies to maintain independence in those times. Note cognitive deficits described below. Continue to recommend HHOT for continued BADL draining. Will continue to follow.    Follow Up Recommendations  Home health OT;Supervision/Assistance - 24 hour    Equipment Recommendations  3 in 1 bedside commode    Recommendations for Other Services      Precautions / Restrictions Precautions Precautions: Back;Fall Precaution Booklet Issued: No Precaution Comments: reviewed back precautions with pt and spouse throughout Required Braces or Orthoses: Spinal Brace Spinal Brace: Thoracolumbosacral orthotic;Applied in sitting position Restrictions Weight Bearing Restrictions: No       Mobility Bed Mobility Overal bed mobility: Needs Assistance Bed Mobility: Sit to Sidelying         Sit to sidelying: Min assist General bed mobility comments: min A to manage BLEs back onto bed. Cues for hand placement and positioning hips with bed pad to lie on side  Transfers Overall transfer level: Needs assistance Equipment used: Rolling walker (2 wheeled) Transfers: Sit to/from Stand Sit to Stand: Min guard         General  transfer comment: min A for stability and cueing for safe hand placements and technique    Balance Overall balance assessment: Needs assistance Sitting-balance support: Bilateral upper extremity supported;Feet supported Sitting balance-Leahy Scale: Fair     Standing balance support: Bilateral upper extremity supported Standing balance-Leahy Scale: Poor Standing balance comment: reliant on external support                           ADL either performed or assessed with clinical judgement   ADL Overall ADL's : Needs assistance/impaired             Lower Body Bathing: Minimal assistance;Sitting/lateral leans;Adhering to back precautions;Cueing for back precautions       Lower Body Dressing: Cueing for back precautions;Sit to/from stand;Maximal assistance Lower Body Dressing Details (indicate cue type and reason): Max A with donning shoes and socks and assisting with threading legs through pants. Pt reports she will have assist at home to help Toilet Transfer: Minimal assistance;Grab bars;Regular Toilet;Cueing for safety           Functional mobility during ADLs: Minimal assistance;Rolling walker;Cueing for safety General ADL Comments: continues to need asssist and cueing to maintain precautions and difficulty with LB BADLs     Vision Baseline Vision/History: No visual deficits     Perception     Praxis      Cognition Arousal/Alertness: Awake/alert Behavior During Therapy: WFL for tasks assessed/performed Overall Cognitive Status: Impaired/Different from baseline Area of Impairment: Problem solving;Safety/judgement;Memory  Memory: Decreased recall of precautions;Decreased short-term memory Following Commands: Follows one step commands with increased time Safety/Judgement: Decreased awareness of safety;Decreased awareness of deficits   Problem Solving: Slow processing General Comments: pt had significant event last night and  needed narcan, seemed a bit groggy this date. Was labile at times and tearful about "not being able to have babies anymore". Increased cues and guidance needed to recall precautions and maintain safety. husband present and compensates often for pt        Exercises     Shoulder Instructions       General Comments VSS on RA    Pertinent Vitals/ Pain       Pain Assessment: Faces Faces Pain Scale: Hurts little more Pain Location: back Pain Descriptors / Indicators: Grimacing Pain Intervention(s): Monitored during session  Home Living                                          Prior Functioning/Environment              Frequency  Min 2X/week        Progress Toward Goals  OT Goals(current goals can now be found in the care plan section)  Progress towards OT goals: Progressing toward goals  Acute Rehab OT Goals Patient Stated Goal: go home OT Goal Formulation: With patient Time For Goal Achievement: 11/18/19 Potential to Achieve Goals: Good  Plan Frequency remains appropriate;Discharge plan remains appropriate    Co-evaluation                 AM-PAC OT "6 Clicks" Daily Activity     Outcome Measure   Help from another person eating meals?: None Help from another person taking care of personal grooming?: A Little Help from another person toileting, which includes using toliet, bedpan, or urinal?: A Little Help from another person bathing (including washing, rinsing, drying)?: A Little Help from another person to put on and taking off regular upper body clothing?: A Little Help from another person to put on and taking off regular lower body clothing?: A Lot 6 Click Score: 18    End of Session Equipment Utilized During Treatment: Gait belt;Rolling walker  OT Visit Diagnosis: Unsteadiness on feet (R26.81);Repeated falls (R29.6);Muscle weakness (generalized) (M62.81);Pain Pain - part of body:  (back)   Activity Tolerance Patient limited by  pain   Patient Left in bed   Nurse Communication Mobility status        Time: 4235-3614 OT Time Calculation (min): 28 min  Charges: OT General Charges $OT Visit: 1 Visit OT Treatments $Self Care/Home Management : 23-37 mins  Dalphine Handing, MSOT, OTR/L Acute Rehabilitation Services [facility] Office Number: [phone] Pager: [phone]  Dalphine Handing 11/05/2019, 2:36 PM

## 2019-11-05 NOTE — Progress Notes (Signed)
Neurosurgery Service Progress Note  Subjective: No acute events overnight, LBP improving, no radicular complaints   Objective: Vitals:   11/05/19 0437 11/05/19 0724 11/05/19 1148 11/05/19 1525  BP:  129/61 (!) 111/55 (!) 119/56  Pulse:  100 90 96  Resp: 16 18 16 17   Temp: 98.4 F (36.9 C) 98.4 F (36.9 C) 98 F (36.7 C) 99 F (37.2 C)  TempSrc: Oral Oral Oral Oral  SpO2: 94% 96% 92% 98%  Weight:      Height:       Temp (24hrs), Avg:98.6 F (37 C), Min:98 F (36.7 C), Max:99 F (37.2 C)  CBC Latest Ref Rng & Units 11/01/2019 11/12/2007  WBC 4.0 - 10.5 K/uL 9.6 9.4  Hemoglobin 12.0 - 15.0 g/dL 15.1(H) 14.1  Hematocrit 36 - 46 % 43.8 40.0  Platelets 150 - 400 K/uL 240 289   BMP Latest Ref Rng & Units 11/01/2019 11/12/2007  Glucose 70 - 99 mg/dL 01/13/2008) 361(W)  BUN 8 - 23 mg/dL 21 21  Creatinine 431(V - 1.00 mg/dL 4.00) 8.67(Y  Sodium 1.95 - 145 mmol/L 141 137  Potassium 3.5 - 5.1 mmol/L 3.7 4.6  Chloride 98 - 111 mmol/L 103 102  CO2 22 - 32 mmol/L 28 26  Calcium 8.9 - 10.3 mg/dL 9.5 9.7    Intake/Output Summary (Last 24 hours) at 11/05/2019 1557 Last data filed at 11/05/2019 0830 Gross per 24 hour  Intake 240 ml  Output --  Net 240 ml    Current Facility-Administered Medications:  .  0.9 %  sodium chloride infusion, 250 mL, Intravenous, Continuous, 01/06/2020, MD, Stopped at 11/04/19 0104 .  acetaminophen (OFIRMEV) IV 1,000 mg, 1,000 mg, Intravenous, Q6H PRN, 01/05/20, MD, Last Rate: 400 mL/hr at 11/05/19 0720, 1,000 mg at 11/05/19 0720 .  acetaminophen (TYLENOL) tablet 650 mg, 650 mg, Oral, Q4H PRN **OR** acetaminophen (TYLENOL) suppository 650 mg, 650 mg, Rectal, Q4H PRN, 01/06/20, MD .  ALPRAZolam Maeola Harman) tablet 1 mg, 1 mg, Oral, TID PRN, Prudy Feeler, MD, 1 mg at 11/04/19 0939 .  alum & mag hydroxide-simeth (MAALOX/MYLANTA) 200-200-20 MG/5ML suspension 30 mL, 30 mL, Oral, Q6H PRN, 11-11-2000, MD .  aspirin EC tablet 81 mg, 81 mg, Oral, Daily, Maeola Harman, MD, 81 mg at 11/05/19 0805 .  bisacodyl (DULCOLAX) suppository 10 mg, 10 mg, Rectal, Daily PRN, 01/06/20, MD .  carvedilol (COREG) tablet 6.25 mg, 6.25 mg, Oral, BID, Maeola Harman, MD, 6.25 mg at 11/05/19 0805 .  [START ON 11/10/2019] cyanocobalamin ((VITAMIN B-12)) injection 1,000 mcg, 1,000 mcg, Intramuscular, Q14 Days, 01/11/2020, MD .  dextrose 5 % and 0.45 % NaCl with KCl 20 mEq/L infusion, , Intravenous, Continuous, Maeola Harman, MD .  docusate sodium (COLACE) capsule 100 mg, 100 mg, Oral, BID, Maeola Harman, MD, 100 mg at 11/05/19 0805 .  DULoxetine (CYMBALTA) DR capsule 30 mg, 30 mg, Oral, BID, 01/06/20, MD, 30 mg at 11/05/19 0805 .  furosemide (LASIX) tablet 20 mg, 20 mg, Oral, Daily, 01/06/20, MD, 20 mg at 11/05/19 0806 .  furosemide (LASIX) tablet 40 mg, 40 mg, Oral, Daily, 01/06/20, MD, 40 mg at 11/05/19 0807 .  gabapentin (NEURONTIN) tablet 600 mg, 600 mg, Oral, QID, 01/06/20, MD, 600 mg at 11/05/19 1348 .  HYDROcodone-acetaminophen (NORCO/VICODIN) 5-325 MG per tablet 1-2 tablet, 1-2 tablet, Oral, Q4H PRN, 01/06/20, MD, 2 tablet at 11/05/19 1348 .  HYDROmorphone (DILAUDID) injection 0.5 mg, 0.5 mg, Intravenous, Q2H PRN, 01/06/20,  MD, 0.5 mg at 11/04/19 1805 .  hydrOXYzine (VISTARIL) injection 50 mg, 50 mg, Intramuscular, Q6H PRN, Maeola Harman, MD, 50 mg at 11/04/19 0715 .  ibuprofen (ADVIL) tablet 800 mg, 800 mg, Oral, Q8H PRN, Maeola Harman, MD .  insulin aspart (novoLOG) injection 0-15 Units, 0-15 Units, Subcutaneous, TID WC, Maeola Harman, MD, 11 Units at 11/05/19 1237 .  insulin aspart (novoLOG) injection 0-5 Units, 0-5 Units, Subcutaneous, QHS, Maeola Harman, MD, 4 Units at 11/04/19 2237 .  insulin NPH Human (NOVOLIN N) injection 30 Units, 30 Units, Subcutaneous, BID AC & HS, Maeola Harman, MD, 30 Units at 11/05/19 1032 .  ketorolac (TORADOL) 15 MG/ML injection 15 mg, 15 mg, Intravenous, Q6H PRN, Maeola Harman, MD, 15 mg at 11/05/19  0447 .  levothyroxine (SYNTHROID) tablet 75 mcg, 75 mcg, Oral, Q0600, Maeola Harman, MD, 75 mcg at 11/05/19 0552 .  losartan (COZAAR) tablet 25 mg, 25 mg, Oral, Daily, Maeola Harman, MD, 25 mg at 11/05/19 0806 .  meclizine (ANTIVERT) tablet 25 mg, 25 mg, Oral, PRN, Maeola Harman, MD .  menthol-cetylpyridinium (CEPACOL) lozenge 3 mg, 1 lozenge, Oral, PRN **OR** phenol (CHLORASEPTIC) mouth spray 1 spray, 1 spray, Mouth/Throat, PRN, Maeola Harman, MD .  methocarbamol (ROBAXIN) tablet 500 mg, 500 mg, Oral, Q6H PRN, 500 mg at 11/04/19 1605 **OR** methocarbamol (ROBAXIN) 500 mg in dextrose 5 % 50 mL IVPB, 500 mg, Intravenous, Q6H PRN, Maeola Harman, MD .  multivitamin with minerals tablet 1 tablet, 1 tablet, Oral, Daily, Legrand Pitts, RPH, 1 tablet at 11/05/19 0805 .  ondansetron (ZOFRAN) tablet 4 mg, 4 mg, Oral, Q6H PRN **OR** ondansetron (ZOFRAN) injection 4 mg, 4 mg, Intravenous, Q6H PRN, Maeola Harman, MD, 4 mg at 11/04/19 0615 .  oxyCODONE (Oxy IR/ROXICODONE) immediate release tablet 5-10 mg, 5-10 mg, Oral, Q3H PRN, Maeola Harman, MD, 10 mg at 11/04/19 1605 .  pantoprazole (PROTONIX) EC tablet 40 mg, 40 mg, Oral, QHS, Leander Rams, RPH .  polyethylene glycol (MIRALAX / GLYCOLAX) packet 17 g, 17 g, Oral, Daily PRN, Maeola Harman, MD .  sodium chloride flush (NS) 0.9 % injection 3 mL, 3 mL, Intravenous, Q12H, Maeola Harman, MD, 3 mL at 11/04/19 2243 .  sodium chloride flush (NS) 0.9 % injection 3 mL, 3 mL, Intravenous, PRN, Maeola Harman, MD .  sodium phosphate (FLEET) 7-19 GM/118ML enema 1 enema, 1 enema, Rectal, Once PRN, Maeola Harman, MD .  Vitamin D (Ergocalciferol) (DRISDOL) capsule 50,000 Units, 50,000 Units, Oral, Once per day on Mon Thu, Stern, Joseph, MD, 50,000 Units at 11/03/19 2139 .  zolpidem (AMBIEN) tablet 5 mg, 5 mg, Oral, QHS PRN, Maeola Harman, MD   Physical Exam: AOx3, PERRL, EOMI, FS, Strength 5/5 x4 except pain-limited HF weakness, SILTx4 Incisions c/d/i  Assessment &  Plan: 69 y.o. woman s/p 3 lvl XLIF / L2-S1 per screws, recovering well.  -discharge home today  Dana Grant  11/05/19 3:57 PM

## 2019-11-08 MED FILL — Sodium Chloride IV Soln 0.9%: INTRAVENOUS | Qty: 1000 | Status: AC

## 2019-11-08 MED FILL — Heparin Sodium (Porcine) Inj 1000 Unit/ML: INTRAMUSCULAR | Qty: 30 | Status: AC

## 2019-11-08 NOTE — Anesthesia Postprocedure Evaluation (Signed)
Anesthesia Post Note  Patient: Dana Grant  Procedure(s) Performed: Lumbar Two-Three Lumbar Three-Four Lumbar Four-Five Anterolateral lumbar interbody fusion with percutaneous pedicle screws (N/A Spine Lumbar) LUMBAR PERCUTANEOUS PEDICLE SCREW 3 LEVEL (N/A )     Patient location during evaluation: PACU Anesthesia Type: General Level of consciousness: awake and alert Pain management: pain level controlled Vital Signs Assessment: post-procedure vital signs reviewed and stable Respiratory status: spontaneous breathing, nonlabored ventilation, respiratory function stable and patient connected to nasal cannula oxygen Cardiovascular status: blood pressure returned to baseline and stable Postop Assessment: no apparent nausea or vomiting Anesthetic complications: no   No complications documented.  Last Vitals:  Vitals:   11/05/19 1148 11/05/19 1525  BP: (!) 111/55 (!) 119/56  Pulse: 90 96  Resp: 16 17  Temp: 36.7 C 37.2 C  SpO2: 92% 98%    Last Pain:  Vitals:   11/05/19 1525  TempSrc: Oral  PainSc:                  Bettylou Frew

## 2019-11-14 ENCOUNTER — Ambulatory Visit (INDEPENDENT_AMBULATORY_CARE_PROVIDER_SITE_OTHER): Payer: Medicare PPO

## 2019-11-14 DIAGNOSIS — Z95810 Presence of automatic (implantable) cardiac defibrillator: Secondary | ICD-10-CM | POA: Diagnosis not present

## 2019-11-14 DIAGNOSIS — I5022 Chronic systolic (congestive) heart failure: Secondary | ICD-10-CM

## 2019-11-14 NOTE — Telephone Encounter (Signed)
Spoke with patient and ICM intro given. She agreed to monthly ICM call.  See 11/14/2019 ICM note for transmission review.

## 2019-11-14 NOTE — Progress Notes (Signed)
EPIC Encounter for ICM Monitoring  Patient Name: Dana Grant is a 69 y.o. female Date: 11/14/2019 Primary Care Physican: Arma Heading, MD Primary Cardiologist: Purvis Sheffield Electrophysiologist: Allred Bi-V Pacing: >99%  11/14/2019 Weight:  unknown       1st ICM remote transmission. Heart Failure questions reviewed.  Pt asymptomatic for fluid accumulation.  Patient is 2 weeks post op lumbar surgery and experiencing pain.   CorVue thoracic impedance suggesting possible fluid accumulation starting 11/04/19.   Prescribed: Furosemide 40 mg and 20 mg tablets take 60 mg daily.  11/14/2019 Pt reports taking differently and taking Furosemide PRN instead of daily  Recommendations:  Patient will take Furosemide 60 mg x 3 days as prescribed instead of PRN.   Follow-up plan: ICM clinic phone appointment on 11/22/2019 to recheck fluid levels.   91 day device clinic remote transmission 01/04/2020.    EP/Cardiology Office Visits: 10/05/2020 with Dr. Johney Frame.    Copy of ICM check sent to Dr. Johney Frame and Dr Purvis Sheffield.   3 month ICM trend: 11/14/2019    1 Year ICM trend:       Karie Soda, RN 11/14/2019 4:29 PM

## 2019-11-22 ENCOUNTER — Ambulatory Visit (INDEPENDENT_AMBULATORY_CARE_PROVIDER_SITE_OTHER): Payer: Medicare PPO

## 2019-11-22 ENCOUNTER — Telehealth: Payer: Self-pay

## 2019-11-22 DIAGNOSIS — Z95810 Presence of automatic (implantable) cardiac defibrillator: Secondary | ICD-10-CM

## 2019-11-22 DIAGNOSIS — I5022 Chronic systolic (congestive) heart failure: Secondary | ICD-10-CM

## 2019-11-22 NOTE — Progress Notes (Signed)
EPIC Encounter for ICM Monitoring  Patient Name: Dana Grant is a 69 y.o. female Date: 11/22/2019 Primary Care Physican: Arma Heading, MD Primary Cardiologist: Purvis Sheffield Electrophysiologist: Allred Bi-V Pacing: >99%          11/14/2019 Weight:  unknown                                                           Attempted call to patient and unable to reach.  Left message to return call. Transmission reviewed.  Patient had back surgery on 11/03/2019   CorVue thoracic impedance returned to normal after taking 3 days of PRN Furosemide.   Prescribed: Furosemide 40 mg and 20 mg tablets take 60 mg daily.  11/14/2019 Pt reports taking differently and taking Furosemide PRN instead of daily  Recommendations:  Unable to reach.    Follow-up plan: ICM clinic phone appointment on 12/19/2019.   91 day device clinic remote transmission 01/04/2020.    EP/Cardiology Office Visits: 10/05/2020 with Dr. Johney Frame.      Copy of ICM check sent to Dr. Johney Frame.   3 month ICM trend: 11/21/2019    1 Year ICM trend:       Karie Soda, RN 11/22/2019 8:13 AM

## 2019-11-22 NOTE — Progress Notes (Addendum)
Patient took PRN furosemide and doing well.  She has a little swelling in one ankle but is keeping it elevated.  She has a PCP scheduled visit on Thursday.  She is slowly recovering from back surgery and feeling a little stronger everyday.  Encouragd to call if she experiences any fluid symptoms and advised take Furosemide as prescribed.  Next ICM remote transmission 12/19/2019

## 2019-11-22 NOTE — Telephone Encounter (Signed)
Remote ICM transmission received.  Attempted call to patient regarding ICM remote transmission and left detailed message to return call.  Advised to return call for any fluid symptoms or questions.

## 2019-12-19 ENCOUNTER — Ambulatory Visit (INDEPENDENT_AMBULATORY_CARE_PROVIDER_SITE_OTHER): Payer: Medicare PPO

## 2019-12-19 DIAGNOSIS — Z95810 Presence of automatic (implantable) cardiac defibrillator: Secondary | ICD-10-CM

## 2019-12-19 DIAGNOSIS — I5022 Chronic systolic (congestive) heart failure: Secondary | ICD-10-CM | POA: Diagnosis not present

## 2019-12-22 ENCOUNTER — Ambulatory Visit: Payer: Medicare PPO | Admitting: Family Medicine

## 2019-12-23 ENCOUNTER — Telehealth: Payer: Self-pay

## 2019-12-23 NOTE — Progress Notes (Signed)
EPIC Encounter for ICM Monitoring  Patient Name: Dana Grant is a 69 y.o. female Date: 12/23/2019 Primary Care Physican: Arma Heading, MD Primary Cardiologist:Koneswaran Electrophysiologist:Allred Bi-V Pacing:>99% 11/14/2019 Weight: unknown   Attempted call to patient and unable to reach.  Left message to return call. Transmission reviewed.  Patient had back surgery on 11/03/2019  CorVue thoracic impedancenormal  Prescribed: Furosemide40 mg and 20 mg tablets take 60 mg daily. 11/14/2019 Pt reports taking differently and taking Furosemide PRN instead of daily  Recommendations:Unable to reach.    Follow-up plan: ICM clinic phone appointment on9/20/2021. 91 day device clinic remote transmission 01/04/2020.   EP/Cardiology Office Visits:6/3/2022with Dr. Johney Frame.     Copy of ICM check sent to Dr. Johney Frame.    3 month ICM trend: 12/19/2019    1 Year ICM trend:       Karie Soda, RN 12/23/2019 3:17 PM

## 2019-12-23 NOTE — Telephone Encounter (Signed)
Remote ICM transmission received.  Attempted call to patient regarding ICM remote transmission and no answer or answering machine. 

## 2019-12-26 ENCOUNTER — Ambulatory Visit: Payer: Medicare PPO | Admitting: Family Medicine

## 2020-01-04 ENCOUNTER — Ambulatory Visit (INDEPENDENT_AMBULATORY_CARE_PROVIDER_SITE_OTHER): Payer: Medicare PPO | Admitting: *Deleted

## 2020-01-04 DIAGNOSIS — Z95810 Presence of automatic (implantable) cardiac defibrillator: Secondary | ICD-10-CM

## 2020-01-05 LAB — CUP PACEART REMOTE DEVICE CHECK
Battery Remaining Longevity: 78 mo
Battery Remaining Percentage: 84 %
Battery Voltage: 2.99 V
Brady Statistic AP VP Percent: 1 %
Brady Statistic AP VS Percent: 0 %
Brady Statistic AS VP Percent: 99 %
Brady Statistic AS VS Percent: 1 %
Brady Statistic RA Percent Paced: 1 %
Date Time Interrogation Session: 20210901043001
HighPow Impedance: 46 Ohm
HighPow Impedance: 46 Ohm
Implantable Lead Location: 753859
Implantable Lead Location: 753860
Implantable Lead Model: 6947
Lead Channel Impedance Value: 1175 Ohm
Lead Channel Impedance Value: 380 Ohm
Lead Channel Impedance Value: 590 Ohm
Lead Channel Pacing Threshold Amplitude: 0.5 V
Lead Channel Pacing Threshold Amplitude: 0.5 V
Lead Channel Pacing Threshold Amplitude: 1 V
Lead Channel Pacing Threshold Pulse Width: 0.5 ms
Lead Channel Pacing Threshold Pulse Width: 0.5 ms
Lead Channel Pacing Threshold Pulse Width: 0.5 ms
Lead Channel Sensing Intrinsic Amplitude: 1.7 mV
Lead Channel Setting Pacing Amplitude: 1.5 V
Lead Channel Setting Pacing Amplitude: 2 V
Lead Channel Setting Pacing Amplitude: 2 V
Lead Channel Setting Pacing Pulse Width: 0.5 ms
Lead Channel Setting Pacing Pulse Width: 0.5 ms
Lead Channel Setting Sensing Sensitivity: 0.5 mV
Pulse Gen Serial Number: 9895991

## 2020-01-06 NOTE — Progress Notes (Signed)
Remote ICD transmission.   

## 2020-01-23 ENCOUNTER — Ambulatory Visit (INDEPENDENT_AMBULATORY_CARE_PROVIDER_SITE_OTHER): Payer: Medicare PPO

## 2020-01-23 DIAGNOSIS — Z95810 Presence of automatic (implantable) cardiac defibrillator: Secondary | ICD-10-CM | POA: Diagnosis not present

## 2020-01-23 DIAGNOSIS — I5022 Chronic systolic (congestive) heart failure: Secondary | ICD-10-CM

## 2020-01-25 NOTE — Progress Notes (Signed)
EPIC Encounter for ICM Monitoring  Patient Name: Dana Grant is a 69 y.o. female Date: 01/25/2020 Primary Care Physican: Arma Heading, MD Primary Cardiologist:Koneswaran Electrophysiologist:Allred Bi-V Pacing:>99% 11/14/2019 Weight: unknown   Spoke with patient and reports no fluid symptoms.  Still recovering from back surgery on 11/03/2019.  CorVue thoracic impedancenormal  Prescribed: Furosemide40 mg and 20 mg tablets take 60 mg daily. 11/14/2019 Pt reports taking differently and taking Furosemide PRN instead of daily  Recommendations:No changes and encouraged to call if experiencing any fluid symptoms.  Follow-up plan: ICM clinic phone appointment on10/25/2021. 91 day device clinic remote transmission 04/04/2020.   EP/Cardiology Office Visits:6/3/2022with Dr. Johney Frame.  Copy of ICM check sent to Dr.Allred.    3 month ICM trend: 01/23/2020    1 Year ICM trend:       Karie Soda, RN 01/25/2020 9:44 AM

## 2020-02-27 ENCOUNTER — Ambulatory Visit (INDEPENDENT_AMBULATORY_CARE_PROVIDER_SITE_OTHER): Payer: Medicare PPO

## 2020-02-27 DIAGNOSIS — Z95810 Presence of automatic (implantable) cardiac defibrillator: Secondary | ICD-10-CM

## 2020-02-27 DIAGNOSIS — I5022 Chronic systolic (congestive) heart failure: Secondary | ICD-10-CM

## 2020-02-29 ENCOUNTER — Telehealth: Payer: Self-pay

## 2020-02-29 NOTE — Telephone Encounter (Signed)
Remote ICM transmission received.  Attempted call to patient regarding ICM remote transmission and left message to return call   

## 2020-02-29 NOTE — Progress Notes (Signed)
EPIC Encounter for ICM Monitoring  Patient Name: Dana Grant is a 69 y.o. female Date: 02/29/2020 Primary Care Physican: Arma Heading, MD Primary Cardiologist:Koneswaran Electrophysiologist:Allred Bi-V Pacing:>99% Weight: unknown   Spoke with patient and reports no fluid symptoms.    CorVue thoracic impedancenormal.  Prescribed: Furosemide40 mg and 20 mg tablets take 60 mg daily. 11/14/2019 Pt reports taking differently and taking Furosemide PRN instead of daily.  Recommendations:No changes and encouraged to call if experiencing any fluid symptoms.  Follow-up plan: ICM clinic phone appointment on 04/05/2020. 91 day device clinic remote transmission 04/04/2020.   EP/Cardiology Office Visits:6/3/2022with Dr. Johney Frame.  Copy of ICM check sent to Dr.Allred.   3 month ICM trend: 02/27/2020    1 Year ICM trend:       Karie Soda, RN 02/29/2020 3:43 PM

## 2020-04-04 ENCOUNTER — Ambulatory Visit (INDEPENDENT_AMBULATORY_CARE_PROVIDER_SITE_OTHER): Payer: Medicare PPO

## 2020-04-04 DIAGNOSIS — I5022 Chronic systolic (congestive) heart failure: Secondary | ICD-10-CM

## 2020-04-04 LAB — CUP PACEART REMOTE DEVICE CHECK
Battery Remaining Longevity: 76 mo
Battery Remaining Percentage: 81 %
Battery Voltage: 2.98 V
Brady Statistic AP VP Percent: 1 %
Brady Statistic AP VS Percent: 0 %
Brady Statistic AS VP Percent: 99 %
Brady Statistic AS VS Percent: 1 %
Brady Statistic RA Percent Paced: 1 %
Date Time Interrogation Session: 20211201020223
HighPow Impedance: 47 Ohm
HighPow Impedance: 47 Ohm
Implantable Lead Location: 753859
Implantable Lead Location: 753860
Implantable Lead Model: 6947
Lead Channel Impedance Value: 1125 Ohm
Lead Channel Impedance Value: 380 Ohm
Lead Channel Impedance Value: 610 Ohm
Lead Channel Pacing Threshold Amplitude: 0.5 V
Lead Channel Pacing Threshold Amplitude: 0.5 V
Lead Channel Pacing Threshold Amplitude: 0.875 V
Lead Channel Pacing Threshold Pulse Width: 0.5 ms
Lead Channel Pacing Threshold Pulse Width: 0.5 ms
Lead Channel Pacing Threshold Pulse Width: 0.5 ms
Lead Channel Sensing Intrinsic Amplitude: 1.8 mV
Lead Channel Setting Pacing Amplitude: 1.5 V
Lead Channel Setting Pacing Amplitude: 2 V
Lead Channel Setting Pacing Amplitude: 2 V
Lead Channel Setting Pacing Pulse Width: 0.5 ms
Lead Channel Setting Pacing Pulse Width: 0.5 ms
Lead Channel Setting Sensing Sensitivity: 0.5 mV
Pulse Gen Serial Number: 9895991

## 2020-04-05 ENCOUNTER — Ambulatory Visit (INDEPENDENT_AMBULATORY_CARE_PROVIDER_SITE_OTHER): Payer: Medicare PPO

## 2020-04-05 DIAGNOSIS — Z95810 Presence of automatic (implantable) cardiac defibrillator: Secondary | ICD-10-CM | POA: Diagnosis not present

## 2020-04-05 DIAGNOSIS — I5022 Chronic systolic (congestive) heart failure: Secondary | ICD-10-CM

## 2020-04-09 NOTE — Progress Notes (Signed)
EPIC Encounter for ICM Monitoring  Patient Name: Dana Grant is a 69 y.o. female Date: 04/09/2020 Primary Care Physican: Arma Heading, MD Primary Cardiologist:Koneswaran Electrophysiologist:Allred Bi-V Pacing:>99% Weight: unknown   Transmission Reviewed.  CorVue thoracic impedancenormal.  Prescribed: Furosemide40 mg and 20 mg tablets take 60 mg daily. 11/14/2019 Pt reports taking differently and taking Furosemide PRN instead of daily.  Recommendations:No changes.  Follow-up plan: ICM clinic phone appointment on1/07/2020. 91 day device clinic remote transmission3/06/2020.   EP/Cardiology Office Visits:6/3/2022with Dr. Johney Frame.  Copy of ICM check sent to Dr.Allred.    3 month ICM trend: 04/05/2020    1 Year ICM trend:       Karie Soda, RN 04/09/2020 11:40 AM

## 2020-04-11 NOTE — Progress Notes (Signed)
Remote ICD transmission.   

## 2020-05-07 ENCOUNTER — Ambulatory Visit (INDEPENDENT_AMBULATORY_CARE_PROVIDER_SITE_OTHER): Payer: Medicare PPO

## 2020-05-07 DIAGNOSIS — Z95810 Presence of automatic (implantable) cardiac defibrillator: Secondary | ICD-10-CM | POA: Diagnosis not present

## 2020-05-07 DIAGNOSIS — I5022 Chronic systolic (congestive) heart failure: Secondary | ICD-10-CM

## 2020-05-09 NOTE — Progress Notes (Signed)
EPIC Encounter for ICM Monitoring  Patient Name: Dana Grant is a 70 y.o. female Date: 05/09/2020 Primary Care Physican: Arma Heading, MD Primary Cardiologist:[facility] Electrophysiologist:Allred Bi-V Pacing:>99% Weight: unknown   Spoke with patient and reports feeling well at this time.  Denies fluid symptoms.    CorVue thoracic impedancenormal.  Prescribed: Furosemide40 mg and 20 mg tablets take 60 mg daily. 05/09/2020 Pt reports taking differently and taking Furosemide PRN instead of daily.  Recommendations:No changes and encouraged to call if experiencing any fluid symptoms.  Encouraged patient to call [facility] office to set cardiology appointment and advised Dr Purvis Sheffield is no longer at that office.  Follow-up plan: ICM clinic phone appointment on2/12/2020. 91 day device clinic remote transmission3/06/2020.   EP/Cardiology Office Visits:6/3/2022with Dr. Johney Frame.  Copy of ICM check sent to Dr.Allred.  3 month ICM trend: 05/07/2020.    1 Year ICM trend:       Karie Soda, RN 05/09/2020 3:29 PM

## 2020-06-12 ENCOUNTER — Ambulatory Visit (INDEPENDENT_AMBULATORY_CARE_PROVIDER_SITE_OTHER): Payer: Medicare PPO

## 2020-06-12 DIAGNOSIS — Z95810 Presence of automatic (implantable) cardiac defibrillator: Secondary | ICD-10-CM

## 2020-06-12 DIAGNOSIS — I5022 Chronic systolic (congestive) heart failure: Secondary | ICD-10-CM | POA: Diagnosis not present

## 2020-06-20 NOTE — Progress Notes (Signed)
EPIC Encounter for ICM Monitoring  Patient Name: Dana Grant is a 70 y.o. female Date: 06/20/2020 Primary Care Physican: Arma Heading, MD Primary Cardiologist:[facility] Electrophysiologist:Allred Bi-V Pacing:>99% Weight: unknown   Transmission reviewed.  CorVue thoracic impedancenormal.  Prescribed: Furosemide40 mg and 20 mg tablets take 60 mg daily. 05/09/2020 Pt reports taking differently and taking Furosemide PRN instead of daily.  Recommendations:No changes.  Follow-up plan: ICM clinic phone appointment on3/14/2022. 91 day device clinic remote transmission3/06/2020.   EP/Cardiology Office Visits:6/3/2022with Dr. Johney Frame.  Copy of ICM check sent to Dr.Allred.  3 month ICM trend: 06/12/2020.    1 Year ICM trend:       Karie Soda, RN 06/20/2020 9:32 AM

## 2020-07-04 ENCOUNTER — Ambulatory Visit (INDEPENDENT_AMBULATORY_CARE_PROVIDER_SITE_OTHER): Payer: Medicare PPO

## 2020-07-04 DIAGNOSIS — I428 Other cardiomyopathies: Secondary | ICD-10-CM

## 2020-07-06 LAB — CUP PACEART REMOTE DEVICE CHECK
Battery Remaining Longevity: 73 mo
Battery Remaining Percentage: 78 %
Battery Voltage: 2.98 V
Brady Statistic AP VP Percent: 1 %
Brady Statistic AP VS Percent: 0 %
Brady Statistic AS VP Percent: 99 %
Brady Statistic AS VS Percent: 1 %
Brady Statistic RA Percent Paced: 1 %
Date Time Interrogation Session: 20220302020032
HighPow Impedance: 48 Ohm
HighPow Impedance: 48 Ohm
Implantable Lead Location: 753859
Implantable Lead Location: 753860
Implantable Lead Model: 6947
Lead Channel Impedance Value: 1200 Ohm
Lead Channel Impedance Value: 380 Ohm
Lead Channel Impedance Value: 630 Ohm
Lead Channel Pacing Threshold Amplitude: 0.5 V
Lead Channel Pacing Threshold Amplitude: 0.625 V
Lead Channel Pacing Threshold Amplitude: 0.875 V
Lead Channel Pacing Threshold Pulse Width: 0.5 ms
Lead Channel Pacing Threshold Pulse Width: 0.5 ms
Lead Channel Pacing Threshold Pulse Width: 0.5 ms
Lead Channel Sensing Intrinsic Amplitude: 1.6 mV
Lead Channel Setting Pacing Amplitude: 1.625
Lead Channel Setting Pacing Amplitude: 2 V
Lead Channel Setting Pacing Amplitude: 2 V
Lead Channel Setting Pacing Pulse Width: 0.5 ms
Lead Channel Setting Pacing Pulse Width: 0.5 ms
Lead Channel Setting Sensing Sensitivity: 0.5 mV
Pulse Gen Serial Number: 9895991

## 2020-07-12 NOTE — Progress Notes (Signed)
Remote pacemaker transmission.   

## 2020-07-16 ENCOUNTER — Ambulatory Visit (INDEPENDENT_AMBULATORY_CARE_PROVIDER_SITE_OTHER): Payer: Medicare PPO

## 2020-07-16 DIAGNOSIS — I5022 Chronic systolic (congestive) heart failure: Secondary | ICD-10-CM | POA: Diagnosis not present

## 2020-07-16 DIAGNOSIS — Z95810 Presence of automatic (implantable) cardiac defibrillator: Secondary | ICD-10-CM

## 2020-07-17 ENCOUNTER — Telehealth: Payer: Self-pay

## 2020-07-17 NOTE — Progress Notes (Signed)
EPIC Encounter for ICM Monitoring  Patient Name: LAURISSA COWPER is a 70 y.o. female Date: 07/17/2020 Primary Care Physican: Arma Heading, MD Primary Cardiologist:[facility] Electrophysiologist:Allred Bi-V Pacing:>99% Weight: unknown   Attempted call to patient and unable to reach.  Left message to return call. Transmission reviewed.   CorVue thoracic impedancesuggesting possible fluid accumulation starting 07/07/2020.  Prescribed: Furosemide40 mg and 20 mg tablets to equal 60 mg daily. 1/5/2022Pt reports taking differently and taking Furosemide PRN instead of daily.  Recommendations:Unable to reach.    Follow-up plan: ICM clinic phone appointment on3/23/2022 to recheck fluid levels. 91 day device clinic remote transmission6/05/2020.   EP/Cardiology Office Visits:6/3/2022with Dr. Johney Frame.  Copy of ICM check sent to Dr.Allred.  3 month ICM trend: 07/16/2020.    1 Year ICM trend:       Karie Soda, RN 07/17/2020 10:46 AM

## 2020-07-17 NOTE — Telephone Encounter (Signed)
Remote ICM transmission received.  Attempted call to patient regarding ICM remote transmission and left to return call.   ?

## 2020-07-25 ENCOUNTER — Ambulatory Visit (INDEPENDENT_AMBULATORY_CARE_PROVIDER_SITE_OTHER): Payer: Medicare PPO

## 2020-07-25 DIAGNOSIS — Z95810 Presence of automatic (implantable) cardiac defibrillator: Secondary | ICD-10-CM

## 2020-07-25 DIAGNOSIS — I5022 Chronic systolic (congestive) heart failure: Secondary | ICD-10-CM

## 2020-07-27 NOTE — Progress Notes (Signed)
EPIC Encounter for ICM Monitoring  Patient Name: Dana Grant is a 70 y.o. female Date: 07/27/2020 Primary Care Physican: Arma Heading, MD Primary Cardiologist:[facility] Electrophysiologist:Allred Bi-V Pacing:>99% Weight: unknown   Transmission reviewed.   CorVue thoracic impedancesuggesting fluid levels returned to normal.  Prescribed: Furosemide40 mg and 20 mg tablets to equal 60 mg daily. 1/5/2022Pt reports taking differently and taking Furosemide PRN instead of daily.  Recommendations: No changes.  Follow-up plan: ICM clinic phone appointment on4/18/2022. 91 day device clinic remote transmission6/05/2020.   EP/Cardiology Office Visits:6/3/2022with Dr. Johney Frame.  Copy of ICM check sent to Dr.Allred.  3 month ICM trend: 07/25/2020.    1 Year ICM trend:       Karie Soda, RN 07/27/2020 1:42 PM

## 2020-08-20 ENCOUNTER — Ambulatory Visit (INDEPENDENT_AMBULATORY_CARE_PROVIDER_SITE_OTHER): Payer: Medicare PPO

## 2020-08-20 DIAGNOSIS — Z95810 Presence of automatic (implantable) cardiac defibrillator: Secondary | ICD-10-CM | POA: Diagnosis not present

## 2020-08-20 DIAGNOSIS — I5022 Chronic systolic (congestive) heart failure: Secondary | ICD-10-CM

## 2020-08-21 ENCOUNTER — Encounter (HOSPITAL_COMMUNITY): Payer: Self-pay | Admitting: Emergency Medicine

## 2020-08-21 ENCOUNTER — Inpatient Hospital Stay (HOSPITAL_COMMUNITY)
Admission: EM | Admit: 2020-08-21 | Discharge: 2020-08-29 | DRG: 543 | Disposition: A | Discharge status: Skilled Nursing Facility | Payer: Medicare PPO | Attending: Internal Medicine | Admitting: Internal Medicine

## 2020-08-21 ENCOUNTER — Emergency Department (HOSPITAL_COMMUNITY): Payer: Medicare PPO

## 2020-08-21 DIAGNOSIS — M4856XA Collapsed vertebra, not elsewhere classified, lumbar region, initial encounter for fracture: Secondary | ICD-10-CM | POA: Diagnosis not present

## 2020-08-21 DIAGNOSIS — E1143 Type 2 diabetes mellitus with diabetic autonomic (poly)neuropathy: Secondary | ICD-10-CM | POA: Diagnosis present

## 2020-08-21 DIAGNOSIS — Z79899 Other long term (current) drug therapy: Secondary | ICD-10-CM

## 2020-08-21 DIAGNOSIS — M549 Dorsalgia, unspecified: Secondary | ICD-10-CM | POA: Diagnosis present

## 2020-08-21 DIAGNOSIS — Z8249 Family history of ischemic heart disease and other diseases of the circulatory system: Secondary | ICD-10-CM

## 2020-08-21 DIAGNOSIS — M48061 Spinal stenosis, lumbar region without neurogenic claudication: Secondary | ICD-10-CM | POA: Diagnosis present

## 2020-08-21 DIAGNOSIS — I428 Other cardiomyopathies: Secondary | ICD-10-CM | POA: Diagnosis present

## 2020-08-21 DIAGNOSIS — M5416 Radiculopathy, lumbar region: Secondary | ICD-10-CM | POA: Diagnosis present

## 2020-08-21 DIAGNOSIS — E871 Hypo-osmolality and hyponatremia: Secondary | ICD-10-CM | POA: Diagnosis present

## 2020-08-21 DIAGNOSIS — Z6838 Body mass index (BMI) 38.0-38.9, adult: Secondary | ICD-10-CM

## 2020-08-21 DIAGNOSIS — Z95810 Presence of automatic (implantable) cardiac defibrillator: Secondary | ICD-10-CM

## 2020-08-21 DIAGNOSIS — W010XXA Fall on same level from slipping, tripping and stumbling without subsequent striking against object, initial encounter: Secondary | ICD-10-CM | POA: Diagnosis present

## 2020-08-21 DIAGNOSIS — Z90710 Acquired absence of both cervix and uterus: Secondary | ICD-10-CM

## 2020-08-21 DIAGNOSIS — I5022 Chronic systolic (congestive) heart failure: Secondary | ICD-10-CM | POA: Diagnosis present

## 2020-08-21 DIAGNOSIS — E039 Hypothyroidism, unspecified: Secondary | ICD-10-CM | POA: Diagnosis present

## 2020-08-21 DIAGNOSIS — Z888 Allergy status to other drugs, medicaments and biological substances status: Secondary | ICD-10-CM

## 2020-08-21 DIAGNOSIS — M545 Low back pain, unspecified: Secondary | ICD-10-CM | POA: Diagnosis not present

## 2020-08-21 DIAGNOSIS — I2510 Atherosclerotic heart disease of native coronary artery without angina pectoris: Secondary | ICD-10-CM | POA: Diagnosis present

## 2020-08-21 DIAGNOSIS — I248 Other forms of acute ischemic heart disease: Secondary | ICD-10-CM | POA: Diagnosis present

## 2020-08-21 DIAGNOSIS — E782 Mixed hyperlipidemia: Secondary | ICD-10-CM | POA: Diagnosis present

## 2020-08-21 DIAGNOSIS — E669 Obesity, unspecified: Secondary | ICD-10-CM | POA: Diagnosis present

## 2020-08-21 DIAGNOSIS — G43909 Migraine, unspecified, not intractable, without status migrainosus: Secondary | ICD-10-CM | POA: Diagnosis present

## 2020-08-21 DIAGNOSIS — N179 Acute kidney failure, unspecified: Secondary | ICD-10-CM | POA: Diagnosis present

## 2020-08-21 DIAGNOSIS — I10 Essential (primary) hypertension: Secondary | ICD-10-CM | POA: Diagnosis present

## 2020-08-21 DIAGNOSIS — Y92009 Unspecified place in unspecified non-institutional (private) residence as the place of occurrence of the external cause: Secondary | ICD-10-CM

## 2020-08-21 DIAGNOSIS — I252 Old myocardial infarction: Secondary | ICD-10-CM

## 2020-08-21 DIAGNOSIS — K3184 Gastroparesis: Secondary | ICD-10-CM | POA: Diagnosis present

## 2020-08-21 DIAGNOSIS — Z794 Long term (current) use of insulin: Secondary | ICD-10-CM

## 2020-08-21 DIAGNOSIS — G4733 Obstructive sleep apnea (adult) (pediatric): Secondary | ICD-10-CM | POA: Diagnosis present

## 2020-08-21 DIAGNOSIS — F411 Generalized anxiety disorder: Secondary | ICD-10-CM | POA: Diagnosis present

## 2020-08-21 DIAGNOSIS — E538 Deficiency of other specified B group vitamins: Secondary | ICD-10-CM | POA: Diagnosis present

## 2020-08-21 DIAGNOSIS — Z20822 Contact with and (suspected) exposure to covid-19: Secondary | ICD-10-CM | POA: Diagnosis present

## 2020-08-21 DIAGNOSIS — R109 Unspecified abdominal pain: Secondary | ICD-10-CM

## 2020-08-21 DIAGNOSIS — G8929 Other chronic pain: Secondary | ICD-10-CM | POA: Diagnosis present

## 2020-08-21 DIAGNOSIS — W19XXXA Unspecified fall, initial encounter: Secondary | ICD-10-CM

## 2020-08-21 DIAGNOSIS — E875 Hyperkalemia: Secondary | ICD-10-CM | POA: Diagnosis present

## 2020-08-21 DIAGNOSIS — Z981 Arthrodesis status: Secondary | ICD-10-CM

## 2020-08-21 DIAGNOSIS — Z880 Allergy status to penicillin: Secondary | ICD-10-CM

## 2020-08-21 DIAGNOSIS — K5900 Constipation, unspecified: Secondary | ICD-10-CM | POA: Diagnosis present

## 2020-08-21 DIAGNOSIS — Z885 Allergy status to narcotic agent status: Secondary | ICD-10-CM

## 2020-08-21 DIAGNOSIS — I110 Hypertensive heart disease with heart failure: Secondary | ICD-10-CM | POA: Diagnosis present

## 2020-08-21 DIAGNOSIS — Z882 Allergy status to sulfonamides status: Secondary | ICD-10-CM

## 2020-08-21 DIAGNOSIS — Z79890 Hormone replacement therapy (postmenopausal): Secondary | ICD-10-CM

## 2020-08-21 DIAGNOSIS — I454 Nonspecific intraventricular block: Secondary | ICD-10-CM | POA: Diagnosis present

## 2020-08-21 DIAGNOSIS — Z87440 Personal history of urinary (tract) infections: Secondary | ICD-10-CM

## 2020-08-21 LAB — CBC WITH DIFFERENTIAL/PLATELET
Abs Immature Granulocytes: 0.07 10*3/uL (ref 0.00–0.07)
Basophils Absolute: 0.1 10*3/uL (ref 0.0–0.1)
Basophils Relative: 1 %
Eosinophils Absolute: 0.3 10*3/uL (ref 0.0–0.5)
Eosinophils Relative: 3 %
HCT: 34.5 % — ABNORMAL LOW (ref 36.0–46.0)
Hemoglobin: 11.6 g/dL — ABNORMAL LOW (ref 12.0–15.0)
Immature Granulocytes: 1 %
Lymphocytes Relative: 29 %
Lymphs Abs: 3.1 10*3/uL (ref 0.7–4.0)
MCH: 33.2 pg (ref 26.0–34.0)
MCHC: 33.6 g/dL (ref 30.0–36.0)
MCV: 98.9 fL (ref 80.0–100.0)
Monocytes Absolute: 0.9 10*3/uL (ref 0.1–1.0)
Monocytes Relative: 9 %
Neutro Abs: 6.2 10*3/uL (ref 1.7–7.7)
Neutrophils Relative %: 57 %
Platelets: 291 10*3/uL (ref 150–400)
RBC: 3.49 MIL/uL — ABNORMAL LOW (ref 3.87–5.11)
RDW: 14.3 % (ref 11.5–15.5)
WBC: 10.6 10*3/uL — ABNORMAL HIGH (ref 4.0–10.5)
nRBC: 0 % (ref 0.0–0.2)

## 2020-08-21 LAB — RESP PANEL BY RT-PCR (FLU A&B, COVID) ARPGX2
Influenza A by PCR: NEGATIVE
Influenza B by PCR: NEGATIVE
SARS Coronavirus 2 by RT PCR: NEGATIVE

## 2020-08-21 LAB — URINALYSIS, ROUTINE W REFLEX MICROSCOPIC
Bilirubin Urine: NEGATIVE
Glucose, UA: NEGATIVE mg/dL
Hgb urine dipstick: NEGATIVE
Ketones, ur: 5 mg/dL — AB
Nitrite: NEGATIVE
Protein, ur: NEGATIVE mg/dL
Specific Gravity, Urine: 1.018 (ref 1.005–1.030)
WBC, UA: >50 WBC/hpf — ABNORMAL HIGH (ref 0–5)
pH: 6 (ref 5.0–8.0)

## 2020-08-21 LAB — BASIC METABOLIC PANEL
Anion gap: 9 (ref 5–15)
BUN: 13 mg/dL (ref 8–23)
CO2: 25 mmol/L (ref 22–32)
Calcium: 8.6 mg/dL — ABNORMAL LOW (ref 8.9–10.3)
Chloride: 99 mmol/L (ref 98–111)
Creatinine, Ser: 1.04 mg/dL — ABNORMAL HIGH (ref 0.44–1.00)
GFR, Estimated: 57 mL/min — ABNORMAL LOW (ref >60)
Glucose, Bld: 162 mg/dL — ABNORMAL HIGH (ref 70–99)
Potassium: 3.9 mmol/L (ref 3.5–5.1)
Sodium: 133 mmol/L — ABNORMAL LOW (ref 135–145)

## 2020-08-21 MED ORDER — ONDANSETRON HCL 4 MG/2ML IJ SOLN
4.0000 mg | Freq: Once | INTRAMUSCULAR | Status: AC
  Administered 2020-08-21: 4 mg via INTRAVENOUS
  Filled 2020-08-21: qty 2

## 2020-08-21 MED ORDER — ACETAMINOPHEN 650 MG RE SUPP: 650.0000 mg | Freq: Four times a day (QID) | RECTAL | Status: DC | PRN

## 2020-08-21 MED ORDER — ENOXAPARIN SODIUM 40 MG/0.4ML ~~LOC~~ SOLN
40.0000 mg | SUBCUTANEOUS | Status: DC
  Administered 2020-08-22 – 2020-08-29 (×8): 40 mg via SUBCUTANEOUS
  Filled 2020-08-21 (×8): qty 0.4

## 2020-08-21 MED ORDER — DIAZEPAM 5 MG/ML IJ SOLN
2.5000 mg | Freq: Once | INTRAMUSCULAR | Status: AC
  Administered 2020-08-21: 2.5 mg via INTRAVENOUS
  Filled 2020-08-21: qty 2

## 2020-08-21 MED ORDER — GABAPENTIN 300 MG PO CAPS
600.0000 mg | ORAL_CAPSULE | Freq: Two times a day (BID) | ORAL | Status: DC | PRN
  Administered 2020-08-22 (×2): 600 mg via ORAL
  Filled 2020-08-21 (×2): qty 2

## 2020-08-21 MED ORDER — DULOXETINE HCL 30 MG PO CPEP
30.0000 mg | ORAL_CAPSULE | Freq: Two times a day (BID) | ORAL | Status: DC
  Administered 2020-08-22 – 2020-08-29 (×15): 30 mg via ORAL
  Filled 2020-08-21 (×17): qty 1

## 2020-08-21 MED ORDER — CARVEDILOL 6.25 MG PO TABS
6.2500 mg | ORAL_TABLET | Freq: Two times a day (BID) | ORAL | Status: DC
  Administered 2020-08-22 – 2020-08-29 (×15): 6.25 mg via ORAL
  Filled 2020-08-21 (×16): qty 1

## 2020-08-21 MED ORDER — METHOCARBAMOL 1000 MG/10ML IJ SOLN
500.0000 mg | Freq: Once | INTRAVENOUS | Status: AC
  Administered 2020-08-21: 500 mg via INTRAVENOUS
  Filled 2020-08-21: qty 5

## 2020-08-21 MED ORDER — FENTANYL CITRATE (PF) 100 MCG/2ML IJ SOLN
50.0000 ug | Freq: Once | INTRAMUSCULAR | Status: AC
  Administered 2020-08-21: 50 ug via INTRAVENOUS
  Filled 2020-08-21: qty 2

## 2020-08-21 MED ORDER — METHOCARBAMOL 500 MG PO TABS
500.0000 mg | ORAL_TABLET | Freq: Four times a day (QID) | ORAL | Status: DC | PRN
  Administered 2020-08-22: 500 mg via ORAL
  Filled 2020-08-21: qty 1

## 2020-08-21 MED ORDER — METHOCARBAMOL 500 MG PO TABS
500.0000 mg | ORAL_TABLET | Freq: Once | ORAL | Status: AC
  Administered 2020-08-21: 500 mg via ORAL
  Filled 2020-08-21: qty 1

## 2020-08-21 MED ORDER — LEVOTHYROXINE SODIUM 75 MCG PO TABS
75.0000 ug | ORAL_TABLET | Freq: Every day | ORAL | Status: DC
  Administered 2020-08-22 – 2020-08-29 (×8): 75 ug via ORAL
  Filled 2020-08-21 (×8): qty 1

## 2020-08-21 MED ORDER — HYDROMORPHONE HCL 1 MG/ML IJ SOLN
0.5000 mg | Freq: Once | INTRAMUSCULAR | Status: AC
  Administered 2020-08-21: 0.5 mg via INTRAVENOUS
  Filled 2020-08-21: qty 1

## 2020-08-21 MED ORDER — ACETAMINOPHEN 325 MG PO TABS
650.0000 mg | ORAL_TABLET | Freq: Four times a day (QID) | ORAL | Status: DC | PRN
  Administered 2020-08-22 – 2020-08-23 (×3): 650 mg via ORAL
  Filled 2020-08-21 (×3): qty 2

## 2020-08-21 MED ORDER — MORPHINE SULFATE (PF) 2 MG/ML IV SOLN
1.0000 mg | INTRAVENOUS | Status: DC | PRN
  Administered 2020-08-22 (×2): 1 mg via INTRAVENOUS
  Filled 2020-08-21 (×3): qty 1

## 2020-08-21 MED ORDER — INSULIN ASPART 100 UNIT/ML ~~LOC~~ SOLN
0.0000 [IU] | Freq: Three times a day (TID) | SUBCUTANEOUS | Status: DC
  Administered 2020-08-22: 2 [IU] via SUBCUTANEOUS
  Administered 2020-08-22: 3 [IU] via SUBCUTANEOUS
  Administered 2020-08-22: 2 [IU] via SUBCUTANEOUS
  Administered 2020-08-23: 3 [IU] via SUBCUTANEOUS
  Administered 2020-08-24: 2 [IU] via SUBCUTANEOUS
  Administered 2020-08-24: 1 [IU] via SUBCUTANEOUS
  Administered 2020-08-24 – 2020-08-25 (×3): 2 [IU] via SUBCUTANEOUS
  Administered 2020-08-25: 3 [IU] via SUBCUTANEOUS
  Administered 2020-08-26 (×2): 2 [IU] via SUBCUTANEOUS
  Administered 2020-08-26: 5 [IU] via SUBCUTANEOUS
  Administered 2020-08-27: 7 [IU] via SUBCUTANEOUS
  Administered 2020-08-27: 1 [IU] via SUBCUTANEOUS
  Administered 2020-08-27: 3 [IU] via SUBCUTANEOUS
  Administered 2020-08-28: 7 [IU] via SUBCUTANEOUS
  Administered 2020-08-28: 2 [IU] via SUBCUTANEOUS
  Administered 2020-08-28 – 2020-08-29 (×2): 3 [IU] via SUBCUTANEOUS
  Administered 2020-08-29: 1 [IU] via SUBCUTANEOUS
  Administered 2020-08-29: 2 [IU] via SUBCUTANEOUS

## 2020-08-21 MED ORDER — INSULIN ASPART PROT & ASPART (70-30 MIX) 100 UNIT/ML ~~LOC~~ SUSP
30.0000 [IU] | Freq: Two times a day (BID) | SUBCUTANEOUS | Status: DC
  Administered 2020-08-22 – 2020-08-29 (×16): 30 [IU] via SUBCUTANEOUS
  Filled 2020-08-21: qty 10

## 2020-08-21 NOTE — ED Triage Notes (Addendum)
Pt fell last Thursday while getting in her car is able to walk but with pain , pt has had bil hip pain since the fall

## 2020-08-21 NOTE — ED Notes (Signed)
Update: Lactic of 2.4 was not regarding this patient.

## 2020-08-21 NOTE — ED Provider Notes (Signed)
  Face-to-face evaluation   History: She is here after a fall, 4 days ago, since then has had increasing back pain.  She has been sleeping in a chair and spending almost all of her time in it, since surgery 7 months ago.  Since the fall she has had less ability to ambulate and can only get up with assistance to stand and pivot, to get in her rolling walker.  Her husband then presents her to the bathroom, where she can stand to get to the toilet.  She has persistent lower back pain and now her legs hurt and feel weak when she tries to move.  She denies fever, dizziness or focal weakness or paresthesia.  She is taking her usual prescribed medications without relief  Physical exam: Awake, alert and cooperative.  She appears comfortable when examined in the supine position.  She has normal sensation in the hands and feet bilaterally.  Medical screening examination/treatment/procedure(s) were conducted as a shared visit with non-physician practitioner(s) and myself.  I personally evaluated the patient during the encounter    Mancel Bale, MD 08/24/20 [phone]

## 2020-08-21 NOTE — ED Notes (Signed)
Pt states every time she comes to hospital, IV team is called due to her veins rolling. Pt is axox4.

## 2020-08-21 NOTE — ED Notes (Signed)
Lactic acid 2.4, chloride >130, blue tube is hemolyzed, EDP notified, RN notified.

## 2020-08-21 NOTE — ED Notes (Signed)
Patient transported to CT 

## 2020-08-21 NOTE — ED Provider Notes (Signed)
[facility] EMERGENCY DEPARTMENT Provider Note   CSN: [id number] Arrival date & time: 08/21/20  1019     History Chief Complaint  Patient presents with  . Fall    Dana Grant is a 70 y.o. female.  Dana Grant is a 70 y.o. female with a history of hypertension, diabetes, hyperlipidemia, hypothyroidism, gastroparesis, CAD, CHF, migraines, sleep apnea, chronic low back pain, s/p lumbar spinal fusion with Dr. Venetia Maxon in July, who presents for evaluation after a fall.  Patient reports on 4/14 she was walking with her walker back into her house from the car after an ED visit in [place] where she was diagnosed with a UTI, she got tripped up on her clothing causing her to fall, she reports she fell backwards and sat down on her bottom, did not fall with back or hit her head.  She reports initially she felt like she was okay, but over the past 5 days has had progressively worsening pain in her back and in both hips worse on the left than right.  Patient already had continued low back pain after her surgery and had been seen in the office with Dr. Venetia Maxon at the end of March, at this time she had not been doing well, was living for the most part out of her recliner and was having severe pains.  They recommended she start back on physical therapy and were going to see her for close follow-up, but with this fall her pain has been unmanageable at home.  If she is completely still she is somewhat comfortable with any movement per her husband she yells out in pain, he cannot move her help.  Did not know what to do.  She has not had any fevers or chills and denies current urinary symptoms after having UTI diagnosed.  She has had some paresthesias in her thighs ever since surgery but denies any new numbness or weakness, no loss of bowel or bladder control or saddle anesthesia..  Not managed with home pain Medications.  No other aggravating or alleviating factors.        Past  Medical History:  Diagnosis Date  . AICD (automatic cardioverter/defibrillator) present   . Anxiety   . Anxiety state 07/18/2019   ICD10 Conversion  . Back pain   . BBB (bundle branch block)   . BMI 39.0-39.9,adult   . Breast mass 03/01/1999  . CAD (coronary artery disease)   . Cardiopathy   . CHF (congestive heart failure) (HCC)   . Chronic ulcer of other specified site 07/18/2019  . Complication of anesthesia    Yes N & V  . DM (diabetes mellitus) (HCC)   . Dyslipidemia   . Essential hypertension 09/02/1999   ICD10 Conversion  . Gastroparesis 07/18/2019  . History of hiatal hernia   . Hypercholesteremia   . Hyperlipidemia, mixed 09/02/1999  . Hypertension   . Hypothyroidism 07/18/2019   ICD10 Conversion  . Left ventricular systolic dysfunction   . Major depressive disorder, recurrent episode, moderate (HCC) 07/18/2019  . Mastodynia 07/18/2019  . Migraine   . Myocardial infarction (HCC) 2011  . Nonischemic cardiomyopathy (HCC)   . Obesity 03/01/1999   ICD10 Conversion  . OSA on CPAP   . Other dyspnea and respiratory abnormality 07/18/2019  . PONV (postoperative nausea and vomiting)   . Presence of permanent cardiac pacemaker   . Proteinuria 07/18/2019  . Spondylosis   . Type II or unspecified type diabetes mellitus without mention of complication,  not stated as uncontrolled 07/18/2019  . Vitamin B12 deficiency     Patient Active Problem List   Diagnosis Date Noted  . Spondylolisthesis of lumbar region 11/03/2019  . Type II or unspecified type diabetes mellitus without mention of complication, not stated as uncontrolled 07/18/2019  . Major depressive disorder, recurrent episode, moderate (HCC) 07/18/2019  . Other dyspnea and respiratory abnormality 07/18/2019  . Proteinuria 07/18/2019  . Mastodynia 07/18/2019  . Hypothyroidism 07/18/2019  . Gastroparesis 07/18/2019  . Chronic ulcer of other specified site 07/18/2019  . Anxiety state 07/18/2019  . Hyperlipidemia, mixed  09/02/1999  . Essential hypertension 09/02/1999  . Obesity 03/01/1999  . Breast mass 03/01/1999    Past Surgical History:  Procedure Laterality Date  . ABDOMINAL HYSTERECTOMY    . ANTERIOR LAT LUMBAR FUSION N/A 11/03/2019   Procedure: Lumbar Two-Three Lumbar Three-Four Lumbar Four-Five Anterolateral lumbar interbody fusion with percutaneous pedicle screws;  Surgeon: Maeola Harman, MD;  Location: [facility] OR;  Service: Neurosurgery;  Laterality: N/A;  Lumbar Two-Three Lumbar Three-Four Lumbar Four-Five Anterolateral lumbar interbody fusion with percutaneous pedicle screws  . BACK SURGERY  2009  . CARDIAC DEFIBRILLATOR PLACEMENT  01/08/2011   implanted in [place] ([facility]) BiV ICD  . CESAREAN SECTION    . CHOLECYSTECTOMY    . IMPLANTABLE CARDIOVERTER DEFIBRILLATOR GENERATOR CHANGE  10/21/2018   placed in [place],  Garrett Jude model [id number] ([place] [id number]) BiV ICD,  RV lead- [id number] [facility] [id number])  implanted by Dr Sabino Donovan  . INSERT / REPLACE / REMOVE PACEMAKER  2012  . LUMBAR PERCUTANEOUS PEDICLE SCREW 3 LEVEL N/A 11/03/2019   Procedure: LUMBAR PERCUTANEOUS PEDICLE SCREW 3 LEVEL;  Surgeon: Maeola Harman, MD;  Location: [facility] OR;  Service: Neurosurgery;  Laterality: N/A;     OB History   No obstetric history on file.     Family History  Problem Relation Age of Onset  . CAD Mother     Social History   Tobacco Use  . Smoking status: Never Smoker  . Smokeless tobacco: Never Used  Substance Use Topics  . Alcohol use: Never  . Drug use: Never    Home Medications Prior to Admission medications   Medication Sig Start Date End Date Taking? Authorizing Provider  ALPRAZolam Prudy Feeler) 1 MG tablet Take 1 mg by mouth 3 (three) times daily as needed for anxiety.    [provider]  aspirin EC 81 MG tablet Take 81 mg by mouth daily.    [provider]  carvedilol (COREG) 6.25 MG tablet Take 6.25 mg by mouth 2 (two) times daily.     [provider]  cyanocobalamin (,VITAMIN  B-12,) 1000 MCG/ML injection Inject 1,000 mcg into the muscle every 14 (fourteen) days.     [provider]  DULoxetine (CYMBALTA) 30 MG capsule Take 30 mg by mouth in the morning and at bedtime.  01/13/06   [provider]  furosemide (LASIX) 20 MG tablet Take 20 mg by mouth daily. To equal 60mg  daily    [provider]  furosemide (LASIX) 40 MG tablet Take 40 mg by mouth daily. To equal 60mg  daily. 04/12/19   [provider]  gabapentin (NEURONTIN) 600 MG tablet Take 600 mg by mouth 4 (four) times daily.  09/22/19   [provider]  HYDROcodone-acetaminophen (NORCO/VICODIN) 5-325 MG tablet Take 1-2 tablets by mouth every 4 (four) hours as needed (pain). 11/05/19   09/24/19, MD  ibuprofen (ADVIL) 800 MG tablet Take 800 mg by mouth every  8 (eight) hours as needed for moderate pain.  09/25/19   [provider]  insulin NPH-regular Human (70-30) 100 UNIT/ML injection Inject 70 Units into the skin 2 (two) times daily.     [provider]  levothyroxine (SYNTHROID) 75 MCG tablet Take 75 mcg by mouth daily before breakfast.     [provider]  losartan (COZAAR) 25 MG tablet Take 25 mg by mouth daily.     [provider]  meclizine (ANTIVERT) 25 MG tablet Take 25 mg by mouth as needed for dizziness.    [provider]  methocarbamol (ROBAXIN) 500 MG tablet Take 1 tablet (500 mg total) by mouth every 6 (six) hours as needed for muscle spasms. 11/05/19   Jadene Pierini, MD  Multiple Vitamin (MULTIVITAMIN) capsule Take 1 capsule by mouth daily.    [provider]  Vitamin D, Ergocalciferol, (DRISDOL) 1.25 MG (50000 UNIT) CAPS capsule Take 50,000 Units by mouth 2 (two) times a week.    [provider]    Allergies    Codeine, Sulfa antibiotics, Demerol [meperidine], Lisinopril, and Penicillins  Review of Systems   Review of Systems  Constitutional: Negative for chills and fever.   Respiratory: Negative for shortness of breath.   Cardiovascular: Negative for chest pain.  Gastrointestinal: Negative for abdominal pain.  Genitourinary: Negative for dysuria and frequency.  Musculoskeletal: Positive for arthralgias and back pain. Negative for joint swelling, myalgias and neck pain.  Skin: Negative for color change and wound.  Neurological: Negative for weakness, numbness and headaches.  All other systems reviewed and are negative.   Physical Exam Updated Vital Signs BP (!) 161/74 (BP Location: Right Arm)   Pulse 75   Temp 98.9 F (37.2 C) (Oral)   Resp 18   SpO2 100%   Physical Exam Vitals and nursing note reviewed.  Constitutional:      General: She is not in acute distress.    Appearance: She is well-developed. She is not diaphoretic.     Comments: Alert, Chronically ill-appearing, appears uncomfortable but is in no acute distress  HENT:     Head: Normocephalic and atraumatic.  Eyes:     General:        Right eye: No discharge.        Left eye: No discharge.  Cardiovascular:     Rate and Rhythm: Normal rate and regular rhythm.     Heart sounds: Normal heart sounds.  Pulmonary:     Effort: Pulmonary effort is normal. No respiratory distress.     Breath sounds: Normal breath sounds. No wheezing or rales.     Comments: Respirations equal and unlabored, patient able to speak in full sentences, lungs clear to auscultation bilaterally, chest NTTP  Abdominal:     General: Bowel sounds are normal. There is no distension.     Palpations: Abdomen is soft. There is no mass.     Tenderness: There is no abdominal tenderness. There is no guarding.     Comments: Abdomen soft, nondistended, nontender to palpation in all quadrants without guarding or peritoneal signs   Musculoskeletal:        General: Tenderness present. No deformity.     Cervical back: Neck supple.     Comments: Tenderness over the low back and bilateral hips without palpable deformity or overlying  skin changes.  With any movement of her lower extremities, in particular on the left side patient yells out in pain.  Skin:    General: Skin  is warm and dry.     Capillary Refill: Capillary refill takes less than 2 seconds.  Neurological:     Mental Status: She is alert.     Coordination: Coordination normal.     Comments: Speech is clear, able to follow commands CN III-XII intact Normal strength in bilateral upper extremities, strength exam in the lower extremities this somewhat limited due to pain, patient has normal strength with plantar and dorsiflexion bilaterally Sensation normal to light and sharp touch Moves extremities without ataxia, coordination intact   Psychiatric:        Behavior: Behavior normal.     ED Results / Procedures / Treatments   Labs (all labs ordered are listed, but only abnormal results are displayed) Labs Reviewed - No data to display  EKG None  Radiology DG Hips Bilat W or Wo Pelvis 3-4 Views  Result Date: 08/21/2020 CLINICAL DATA:  Pain after trauma. EXAM: DG HIP (WITH OR WITHOUT PELVIS) 3-4V BILAT COMPARISON:  None. FINDINGS: AP view of the pelvis and AP/frog leg views of both hips. Incompletely imaged lumbar spine fixation. Left greater than right sacroiliac joint sclerosis which is likely degenerative. No acute fracture or dislocation. IMPRESSION: No acute osseous abnormality. Electronically Signed   By: Jeronimo GreavesKyle  Talbot M.D.   On: 08/21/2020 11:36    Procedures Procedures   Medications Ordered in ED Medications - No data to display  ED Course  I have reviewed the triage vital signs and the nursing notes.  Pertinent labs & imaging results that were available during my care of the patient were reviewed by me and considered in my medical decision making (see chart for details).    MDM Rules/Calculators/A&P                          70 year old female with chronic low back pain and lumbar spinal fusion presents 5 days after a mechanical fall,  fell on her bottom causing worsening pain to her low back and bilateral hips.  Pain worse with any movement, now unable to walk or get out of the recliner at home.  No new neurologic deficits, no saddle anesthesia or loss of bowel or bladder control.  Plain films of the hips ordered from triage.  I have reviewed x-rays, there is no evidence of fracture, given patient's severe pain though will need CT imaging of the hips and lumbar spine to assess for occult fracture.  CT imaging reviewed, no evidence of hip or pelvic fracture, no acute lumbar spine fracture, but there does appear to be some progression of patient's chronic degenerative changes.  Continues to have severe pain despite medications here in the ED and is not able to tolerate much movement at all.  Will consult with neurosurgery.  Case discussed with NP Docia BarrierJosh McDaniel who works with Dr. Venetia MaxonStern, he came to see and evaluate the patient.  Does not feel that she has an acute neurologic process requiring MRI or surgery, but does feel that given her significantly worsened mobility and pain that she likely needs to be admitted for aggressive pain control, and PT and OT evaluation as patient is not doing well at all at home.  Will check basic labs and urinalysis and consult medicine for admission.  Lab work without significant abnormality.  UA is pending.  Case discussed with Dr. Toniann FailKakrakandy with [facility] who will see and admit the patient  Final Clinical Impression(s) / ED Diagnoses Final diagnoses:  Fall, initial encounter  Rx / DC Orders ED Discharge Orders    None       Legrand Rams 08/25/20 0130    Mancel Bale, MD 08/25/20 [phone]

## 2020-08-21 NOTE — ED Notes (Signed)
Attempted to call report, unsuccessful, will call back. 

## 2020-08-21 NOTE — Consult Note (Signed)
Reason for Consult: Low back pain Referring Physician: Dartha Lodge, PA-C   HPI: Dana Grant is an 70 y.o. female who underwent a left L2/3, L3/4, L4/5 XLIF with percutaneous pedicle screw fixation on 11/03/2019. Since her surgery, she has had persistent lumbar pain that intermittently radiates into her bilateral thighs. She was last evaluated in the office on 08/01/2020 and was instructed to resume physical therapy and follow up in 6 weeks. Today, she presents to the ED due to c/o severe and intractable lumbar pain that radiates into her bilateral hips, left greater than right. Per her husband, she has also had c/o left thigh paraesthesia.  Of note, she was admitted to the hospital on 08/05/2020 due to severe N/V. She reports never receiving an official diagnosis and was ultimately discharged home. Her husband feels that she likely suffered muscle strains due to the intense vomiting during her admission. Once she was discharged, she reports a significant increase in her lumbar pain and a decrease in her mobility.  Her symptoms continue to progress cyst and progressively worsen over the next week and then presented again to the emergency department for continued follow-up.  She reports subsequently being diagnosed with a urinary tract infection, treated, and then discharged.  On the day of her discharge, she arrived home wearing 2 hospital gowns, 1 in the front, and 1 in the back, and was holding onto the chain-link fence at her house while her husband was opening the door, and then suffered a fall.  Since her fall she has had a significant increase in her already exacerbated low back pain.  She reports that her back pain is a constant 7 out of 10 and a 10 out of 10 at its worst.  Over the last month, she has been sleeping in a recliner due to her pain and has not been able to mobilize.  When she does mobilize she is relying on her Rollator walker and her husband and his only able to pivot and  turn.   Past surgical history: Left L2/3, L3/4, L4/5 XLIF with percutaneous pedicle screw fixation 11/03/2019  C6/7 ACDF 11/12/2007        Past Medical History:  Diagnosis Date  . AICD (automatic cardioverter/defibrillator) present   . Anxiety   . Anxiety state 07/18/2019   ICD10 Conversion  . Back pain   . BBB (bundle branch block)   . BMI 39.0-39.9,adult   . Breast mass 03/01/1999  . CAD (coronary artery disease)   . Cardiopathy   . CHF (congestive heart failure) (HCC)   . Chronic ulcer of other specified site 07/18/2019  . Complication of anesthesia    Yes N & V  . DM (diabetes mellitus) (HCC)   . Dyslipidemia   . Essential hypertension 09/02/1999   ICD10 Conversion  . Gastroparesis 07/18/2019  . History of hiatal hernia   . Hypercholesteremia   . Hyperlipidemia, mixed 09/02/1999  . Hypertension   . Hypothyroidism 07/18/2019   ICD10 Conversion  . Left ventricular systolic dysfunction   . Major depressive disorder, recurrent episode, moderate (HCC) 07/18/2019  . Mastodynia 07/18/2019  . Migraine   . Myocardial infarction (HCC) 2011  . Nonischemic cardiomyopathy (HCC)   . Obesity 03/01/1999   ICD10 Conversion  . OSA on CPAP   . Other dyspnea and respiratory abnormality 07/18/2019  . PONV (postoperative nausea and vomiting)   . Presence of permanent cardiac pacemaker   . Proteinuria 07/18/2019  . Spondylosis   . Type II  or unspecified type diabetes mellitus without mention of complication, not stated as uncontrolled 07/18/2019  . Vitamin B12 deficiency     Past Surgical History:  Procedure Laterality Date  . ABDOMINAL HYSTERECTOMY    . ANTERIOR LAT LUMBAR FUSION N/A 11/03/2019   Procedure: Lumbar Two-Three Lumbar Three-Four Lumbar Four-Five Anterolateral lumbar interbody fusion with percutaneous pedicle screws;  Surgeon: Maeola Harman, MD;  Location: [facility] OR;  Service: Neurosurgery;  Laterality: N/A;  Lumbar Two-Three Lumbar Three-Four Lumbar Four-Five Anterolateral lumbar  interbody fusion with percutaneous pedicle screws  . BACK SURGERY  2009  . CARDIAC DEFIBRILLATOR PLACEMENT  01/08/2011   implanted in [place] ([facility]) BiV ICD  . CESAREAN SECTION    . CHOLECYSTECTOMY    . IMPLANTABLE CARDIOVERTER DEFIBRILLATOR GENERATOR CHANGE  10/21/2018   placed in [place],  [place] Jude model [id number] ([place] [id number]) BiV ICD,  RV lead- [id number] [facility] [id number])  implanted by Dr Sabino Donovan  . INSERT / REPLACE / REMOVE PACEMAKER  2012  . LUMBAR PERCUTANEOUS PEDICLE SCREW 3 LEVEL N/A 11/03/2019   Procedure: LUMBAR PERCUTANEOUS PEDICLE SCREW 3 LEVEL;  Surgeon: Maeola Harman, MD;  Location: [facility] OR;  Service: Neurosurgery;  Laterality: N/A;    Family History  Problem Relation Age of Onset  . CAD Mother     Social History:  reports that she has never smoked. She has never used smokeless tobacco. She reports that she does not drink alcohol and does not use drugs.  Allergies:  Allergies  Allergen Reactions  . Codeine Nausea And Vomiting  . Sulfa Antibiotics Nausea And Vomiting  . Demerol [Meperidine]     Pt has no recollection of this  . Lisinopril     Pt has no recollection of this  . Penicillins     Pt has no recollection of this    Medications: I have reviewed the patient's current medications.  No results found for this or any previous visit (from the past 48 hour(s)).  CT Lumbar Spine Wo Contrast  Result Date: 08/21/2020 CLINICAL DATA:  Fall today.  Low back and bilateral hip pain. EXAM: CT LUMBAR SPINE WITHOUT CONTRAST TECHNIQUE: Multidetector CT imaging of the lumbar spine was performed without intravenous contrast administration. Multiplanar CT image reconstructions were also generated. COMPARISON:  Lumbar spine radiographs 08/01/2020 and earlier. Lumbar CT myelogram 07/18/2019. FINDINGS: Segmentation: 5 lumbar type vertebrae. Alignment: 6 mm anterolisthesis of L4 on L5. Vertebrae: Displaced fracture or involving the superior and anterior aspects of the L1 vertebral body  with 55% anterior vertebral body height loss, new from the 07/2019 CT but chronic in appearance and present on multiple interval radiographs including 12/05/2019. Displaced, healed fracture of the L5 vertebral body, also new from 07/2019 but present on interval radiographs. L2-L5 posterior and interbody fusion with subsidence of the interbody spacer at L4-5 in the setting of the chronic L5 vertebral fracture. Minimal lucency surrounding both L5 pedicle screws. Slight traversal of the lateral recess by the right L5 screw. Solid interbody osseous fusion at L2-3 and L3-4 without solid interbody fusion at L4-5. Solid posterolateral osseous fusion at each of the operative levels. Chronic interbody and facet ankylosis at L5-S1. No acute fracture or suspicious osseous lesion. Paraspinal and other soft tissues: Hiatal hernia. Abdominal aortic atherosclerosis without aneurysm. Atelectasis and possible trace pleural effusion in the left lung base. Disc levels: T10-11: Disc bulging, endplate spurring, and mild facet arthrosis result in mild bilateral neural foraminal stenosis without evidence of significant spinal stenosis. T11-12: Disc bulging and mild-to-moderate left facet  arthrosis without evidence of significant stenosis. T12-L1: Disc bulging with a large, broad posterior osteophyte or calcified disc herniation and moderate right and severe left facet arthrosis result in moderate spinal stenosis, left greater than right lateral recess stenosis, and moderate to severe bilateral neural foraminal stenosis, progressed from the prior CT. L1-2: Mild disc bulging and mild facet and ligamentum flavum hypertrophy without evidence of significant stenosis. L2-3: Interval fusion with improved spinal canal patency compared to the prior CT. Moderate left greater than right neural foraminal stenosis due to disc bulging, endplate spurring, and asymmetric left facet hypertrophy. L3-4: Interval fusion with improved spinal canal patency  compared to the prior CT. Moderate left neural foraminal stenosis due to disc bulging, endplate spurring, and asymmetric left facet hypertrophy. L4-5: Fusion with limited assessment of the spinal canal due to streak artifact. Moderate to severe bilateral neural foraminal stenosis due to disc bulging, endplate spurring, and facet hypertrophy. L5-S1: Chronic interbody and facet ankylosis. No evidence of significant spinal stenosis. Mild to moderate osseous neural foraminal stenosis bilaterally. IMPRESSION: 1. No acute osseous abnormality identified in the lumbar spine. 2. Chronic fractures of the L1 and L5 vertebral bodies. 3. L2-L5 posterior and interbody fusion with residual neural foraminal stenosis as above. 4. Moderate spinal stenosis and moderate to severe bilateral neural foraminal stenosis at T12-L1, progressed from 07/2019. 5. Aortic Atherosclerosis (ICD10-I70.0). Electronically Signed   By: Sebastian Ache M.D.   On: 08/21/2020 13:18   CT PELVIS WO CONTRAST  Result Date: 08/21/2020 CLINICAL DATA:  Bilateral hip pain after fall. EXAM: CT PELVIS WITHOUT CONTRAST TECHNIQUE: Multidetector CT imaging of the pelvis was performed following the standard protocol without intravenous contrast. COMPARISON:  None. FINDINGS: Urinary Tract:  No abnormality visualized. Bowel:  Unremarkable visualized pelvic bowel loops. Vascular/Lymphatic: No pathologically enlarged lymph nodes. No significant vascular abnormality seen. Reproductive: Status post hysterectomy. No adnexal abnormality is noted. Other:  None. Musculoskeletal: No suspicious bone lesions identified. No fracture is noted. IMPRESSION: No fracture or other significant bony abnormality is noted. Electronically Signed   By: Lupita Raider M.D.   On: 08/21/2020 13:18   DG Hips Bilat W or Wo Pelvis 3-4 Views  Result Date: 08/21/2020 CLINICAL DATA:  Pain after trauma. EXAM: DG HIP (WITH OR WITHOUT PELVIS) 3-4V BILAT COMPARISON:  None. FINDINGS: AP view of the  pelvis and AP/frog leg views of both hips. Incompletely imaged lumbar spine fixation. Left greater than right sacroiliac joint sclerosis which is likely degenerative. No acute fracture or dislocation. IMPRESSION: No acute osseous abnormality. Electronically Signed   By: Jeronimo Greaves M.D.   On: 08/21/2020 11:36    Review of Systems  Constitutional: Positive for activity change. Negative for appetite change, chills, diaphoresis, fatigue, fever and unexpected weight change.  HENT: Negative.   Eyes: Negative.   Respiratory: Negative.   Cardiovascular: Negative.   Gastrointestinal: Negative for abdominal distention, abdominal pain, constipation, diarrhea, nausea and vomiting.  Genitourinary: Negative for difficulty urinating, dysuria, flank pain, frequency, hematuria, pelvic pain and urgency.       Recent diagnosis and treatment of UTI at [facility].  Musculoskeletal: Positive for back pain and gait problem (Decreased ability to mobilize d/t severe low back pain). Negative for arthralgias, joint swelling and myalgias.  Skin: Negative.   Allergic/Immunologic: Negative.   Neurological: Positive for weakness and numbness (LLE, per husband, left thigh). Negative for dizziness, light-headedness and headaches.  Psychiatric/Behavioral: Negative.    Blood pressure (!) 159/78, pulse 74, temperature 98.9 F (37.2  C), temperature source Oral, resp. rate 20, SpO2 96 %. Physical Exam Constitutional:      General: She is in acute distress (Patient in severe pain and yelling out d/t pain).     Appearance: She is obese. She is not ill-appearing, toxic-appearing or diaphoretic.  HENT:     Head: Normocephalic and atraumatic.     Nose: Nose normal.  Eyes:     Extraocular Movements: Extraocular movements intact.     Conjunctiva/sclera: Conjunctivae normal.     Pupils: Pupils are equal, round, and reactive to light.  Cardiovascular:     Pulses: Normal pulses.     Heart sounds: Normal heart  sounds.  Pulmonary:     Effort: Pulmonary effort is normal.     Breath sounds: Normal breath sounds.  Abdominal:     General: Abdomen is flat. Bowel sounds are normal.     Palpations: Abdomen is soft.  Musculoskeletal:        General: Tenderness (tenderness to palpation in lumbar region and bilateral hips) present.     Cervical back: Normal range of motion.  Skin:    General: Skin is warm and dry.  Neurological:     General: No focal deficit present.     Mental Status: She is alert and oriented to person, place, and time.     Sensory: No sensory deficit.     Motor: Weakness present.     Coordination: Coordination normal.     Deep Tendon Reflexes: Reflexes normal.  Psychiatric:        Mood and Affect: Mood normal.        Behavior: Behavior normal.        Thought Content: Thought content normal.        Judgment: Judgment normal.     Assessment/Plan: 70 year old female who is S/P  left L2/3, L3/4, L4/5 XLIF with percutaneous pedicle screw fixation on 11/03/2019.  Since her surgery, she has continued to experience lumbar pain.  Over the last month she has had severe nausea and vomiting, and a urinary tract infection which has required two separate hospital admissions.  More recently, after being discharged from her last hospital admission, she suffered a fall which subsequently led to increased lumbar pain that radiates into her bilateral hips, left greater than right. CT lumbar spine was unremarkable for any acute fracture or structural abnormality.  Chronic appearing compression fractures of the L1 and L5 vertebral bodies with approximately 50% height loss. L2-L5 fusion is stable. I feel as though the patient is having an exacerbation of her lumbago that is most likely due to mechanical low back pain more so than a structural issue. No surgical intervention to offer the patient at this time. I would recommend medicine service admit with aggressive pain control. Could try diazepam for muscle  spasms. PT/OT evaluation and treatment. Patient will likely need SNF placement for later convalescence.      Council Mechanic, DNP, NP-C 08/21/2020, 3:12 PM

## 2020-08-21 NOTE — H&P (Signed)
History and Physical    Dana Grant KPQ:[id number] DOB: Dec 02, 1950 DOA: 08/21/2020  PCP: Pcp, No  Patient coming from: Home.  Chief Complaint: Low back pain.  HPI: Dana Grant is a 70 y.o. female with history of chronic low back pain had a fall about 4 to 5 days ago since then the pain is acutely worsened radiating to left lower extremity.  Denies any incontinence of bowel or urine.  ED Course: In the ER CT pelvis abdomen and lumbar spine were done which does not show any acute.  On-call neurosurgeon was consulted requested no surgical intervention required as patient's pain is mostly musculoskeletal in need pain control and may need rehab.  COVID test is negative.  Labs show blood glucose of 162 hemoglobin 9.2.  Review of Systems: As per HPI, rest all negative.   Past Medical History:  Diagnosis Date  . AICD (automatic cardioverter/defibrillator) present   . Anxiety   . Anxiety state 07/18/2019   ICD10 Conversion  . Back pain   . BBB (bundle branch block)   . BMI 39.0-39.9,adult   . Breast mass 03/01/1999  . CAD (coronary artery disease)   . Cardiopathy   . CHF (congestive heart failure) (HCC)   . Chronic ulcer of other specified site 07/18/2019  . Complication of anesthesia    Yes N & V  . DM (diabetes mellitus) (HCC)   . Dyslipidemia   . Essential hypertension 09/02/1999   ICD10 Conversion  . Gastroparesis 07/18/2019  . History of hiatal hernia   . Hypercholesteremia   . Hyperlipidemia, mixed 09/02/1999  . Hypertension   . Hypothyroidism 07/18/2019   ICD10 Conversion  . Left ventricular systolic dysfunction   . Major depressive disorder, recurrent episode, moderate (HCC) 07/18/2019  . Mastodynia 07/18/2019  . Migraine   . Myocardial infarction (HCC) 2011  . Nonischemic cardiomyopathy (HCC)   . Obesity 03/01/1999   ICD10 Conversion  . OSA on CPAP   . Other dyspnea and respiratory abnormality 07/18/2019  . PONV (postoperative nausea and vomiting)   .  Presence of permanent cardiac pacemaker   . Proteinuria 07/18/2019  . Spondylosis   . Type II or unspecified type diabetes mellitus without mention of complication, not stated as uncontrolled 07/18/2019  . Vitamin B12 deficiency     Past Surgical History:  Procedure Laterality Date  . ABDOMINAL HYSTERECTOMY    . ANTERIOR LAT LUMBAR FUSION N/A 11/03/2019   Procedure: Lumbar Two-Three Lumbar Three-Four Lumbar Four-Five Anterolateral lumbar interbody fusion with percutaneous pedicle screws;  Surgeon: Maeola Harman, MD;  Location: [facility] OR;  Service: Neurosurgery;  Laterality: N/A;  Lumbar Two-Three Lumbar Three-Four Lumbar Four-Five Anterolateral lumbar interbody fusion with percutaneous pedicle screws  . BACK SURGERY  2009  . CARDIAC DEFIBRILLATOR PLACEMENT  01/08/2011   implanted in Dana Grant (SJM) BiV ICD  . CESAREAN SECTION    . CHOLECYSTECTOMY    . IMPLANTABLE CARDIOVERTER DEFIBRILLATOR GENERATOR CHANGE  10/21/2018   placed in [place],  Dana Grant model FR1021-11N (Dana Grant [id number]) BiV ICD,  RV lead- [phone] [facility] [id number] V)  implanted by Dr Sabino Donovan  . INSERT / REPLACE / REMOVE PACEMAKER  2012  . LUMBAR PERCUTANEOUS PEDICLE SCREW 3 LEVEL N/A 11/03/2019   Procedure: LUMBAR PERCUTANEOUS PEDICLE SCREW 3 LEVEL;  Surgeon: Maeola Harman, MD;  Location: [facility] OR;  Service: Neurosurgery;  Laterality: N/A;     reports that she has never smoked. She has never used smokeless tobacco. She reports that she does not drink alcohol  and does not use drugs.  Allergies  Allergen Reactions  . Codeine Nausea And Vomiting  . Sulfa Antibiotics Nausea And Vomiting  . Demerol [Meperidine]     Pt has no recollection of this  . Lisinopril     Pt has no recollection of this  . Penicillins     Pt has no recollection of this    Family History  Problem Relation Age of Onset  . CAD Mother     Prior to Admission medications   Medication Sig Start Date End Date Taking? Authorizing Provider  ALPRAZolam (XANAX) 1 MG tablet  Take 1 mg by mouth 3 (three) times daPrudy Feelerily as needed for anxiety.   Yes [provider]  carvedilol (COREG) 6.25 MG tablet Take 6.25 mg by mouth 2 (two) times daily.    Yes [provider]  cyanocobalamin (,VITAMIN B-12,) 1000 MCG/ML injection Inject 1,000 mcg into the muscle every 14 (fourteen) days.    Yes [provider]  DULoxetine (CYMBALTA) 30 MG capsule Take 30 mg by mouth in the morning and at bedtime.  01/13/06  Yes [provider]  gabapentin (NEURONTIN) 600 MG tablet Take 600 mg by mouth See admin instructions. Take 600 mg up to 2 times daily as needed for neuropathy 09/22/19  Yes [provider]  HYDROcodone-acetaminophen (NORCO/VICODIN) 5-325 MG tablet Take 1-2 tablets by mouth every 4 (four) hours as needed (pain). Patient taking differently: Take 1-2 tablets by mouth every 6 (six) hours as needed (pain). 11/05/19  Yes Jadene Pierinistergard, Thomas A, MD  ibuprofen (ADVIL) 800 MG tablet Take 800 mg by mouth every 8 (eight) hours as needed for moderate pain.  09/25/19  Yes [provider]  insulin NPH-regular Human (70-30) 100 UNIT/ML injection Inject 60 Units into the skin 2 (two) times daily.   Yes [provider]  levothyroxine (SYNTHROID) 75 MCG tablet Take 75 mcg by mouth daily before breakfast.    Yes [provider]  meclizine (ANTIVERT) 25 MG tablet Take 25 mg by mouth daily as needed (inner ear issues as needed).   Yes [provider]  methocarbamol (ROBAXIN) 500 MG tablet Take 1 tablet (500 mg total) by mouth every 6 (six) hours as needed for muscle spasms. 11/05/19  Yes OstergardClovis Pu, Thomas A, MD  Vitamin D, Ergocalciferol, (DRISDOL) 1.25 MG (50000 UNIT) CAPS capsule Take 50,000 Units by mouth 2 (two) times a week.   Yes [provider]  aspirin EC 81 MG tablet Take 81 mg by mouth daily. Patient not taking: No sig reported    [provider]  furosemide (LASIX) 20 MG tablet Take 20 mg by mouth daily. To  equal 60mg  daily Patient not taking: No sig reported    [provider]  furosemide (LASIX) 40 MG tablet Take 40 mg by mouth daily. To equal 60mg  daily. Patient not taking: No sig reported 04/12/19   [provider]    Physical Exam: Constitutional: Moderately built and nourished. Vitals:   08/21/20 2045 08/21/20 2100 08/21/20 2115 08/21/20 2130  BP: (!) 153/73 (!) 151/70 (!) 154/78 (!) 158/76  Pulse: 81 84 80 78  Resp: 17 19 20  (!) 22  Temp:      TempSrc:      SpO2: 94% 93% 92% 92%   Eyes: Anicteric no pallor. ENMT: No discharge from the ears eyes nose or mouth. Neck: No mass felt.  No neck rigidity. Respiratory: No rhonchi or crepitations. Cardiovascular: S1-S2 heard. Abdomen: Soft nontender bowel sounds present.  Musculoskeletal: No edema. Skin: No rash. Neurologic: Alert awake oriented to time place and person.  Moves all extremities. Psychiatric: Appears normal.  Normal affect.   Labs on Admission: I have personally reviewed following labs and imaging studies  CBC: Recent Labs  Lab 08/21/20 1749  WBC 10.6*  NEUTROABS 6.2  HGB 11.6*  HCT 34.5*  MCV 98.9  PLT 291   Basic Metabolic Panel: Recent Labs  Lab 08/21/20 1749  NA 133*  K 3.9  CL 99  CO2 25  GLUCOSE 162*  BUN 13  CREATININE 1.04*  CALCIUM 8.6*   GFR: CrCl cannot be calculated (Unknown ideal weight.). Liver Function Tests: No results for input(s): AST, ALT, ALKPHOS, BILITOT, PROT, ALBUMIN in the last 168 hours. No results for input(s): LIPASE, AMYLASE in the last 168 hours. No results for input(s): AMMONIA in the last 168 hours. Coagulation Profile: No results for input(s): INR, PROTIME in the last 168 hours. Cardiac Enzymes: No results for input(s): CKTOTAL, CKMB, CKMBINDEX, TROPONINI in the last 168 hours. BNP (last 3 results) No results for input(s): PROBNP in the last 8760 hours. HbA1C: No results for input(s): HGBA1C in the last 72 hours. CBG: No results for  input(s): GLUCAP in the last 168 hours. Lipid Profile: No results for input(s): CHOL, HDL, LDLCALC, TRIG, CHOLHDL, LDLDIRECT in the last 72 hours. Thyroid Function Tests: No results for input(s): TSH, T4TOTAL, FREET4, T3FREE, THYROIDAB in the last 72 hours. Anemia Panel: No results for input(s): VITAMINB12, FOLATE, FERRITIN, TIBC, IRON, RETICCTPCT in the last 72 hours. Urine analysis:    Component Value Date/Time   COLORURINE YELLOW 08/21/2020 1750   APPEARANCEUR HAZY (A) 08/21/2020 1750   LABSPEC 1.018 08/21/2020 1750   PHURINE 6.0 08/21/2020 1750   GLUCOSEU NEGATIVE 08/21/2020 1750   HGBUR NEGATIVE 08/21/2020 1750   BILIRUBINUR NEGATIVE 08/21/2020 1750   KETONESUR 5 (A) 08/21/2020 1750   PROTEINUR NEGATIVE 08/21/2020 1750   NITRITE NEGATIVE 08/21/2020 1750   LEUKOCYTESUR LARGE (A) 08/21/2020 1750   Sepsis Labs: @LABRCNTIP (procalcitonin:4,lacticidven:4) ) Recent Results (from the past 240 hour(s))  Resp Panel by RT-PCR (Flu A&B, Covid) Nasopharyngeal Swab     Status: None   Collection Time: 08/21/20  7:01 PM   Specimen: Nasopharyngeal Swab; Nasopharyngeal(NP) swabs in vial transport medium  Result Value Ref Range Status   SARS Coronavirus 2 by RT PCR NEGATIVE NEGATIVE Final    Comment: (NOTE) SARS-CoV-2 target nucleic acids are NOT DETECTED.  The SARS-CoV-2 RNA is generally detectable in upper respiratory specimens during the acute phase of infection. The lowest concentration of SARS-CoV-2 viral copies this assay can detect is 138 copies/mL. A negative result does not preclude SARS-Cov-2 infection and should not be used as the sole basis for treatment or other patient management decisions. A negative result may occur with  improper specimen collection/handling, submission of specimen other than nasopharyngeal swab, presence of viral mutation(s) within the areas targeted by this assay, and inadequate number of viral copies(<138 copies/mL). A negative result must be combined  with clinical observations, patient history, and epidemiological information. The expected result is Negative.  Fact Sheet for Patients:  08/23/20  Fact Sheet for Healthcare Providers:  BloggerCourse.com  This test is no t yet approved or cleared by the [place] FDA and  has been authorized for detection and/or diagnosis of SARS-CoV-2 by FDA under an Emergency Use Authorization (EUA). This EUA will remain  in effect (meaning this test can be used) for the duration of the COVID-19 declaration under Section  564(b)(1) of the Act, 21 U.S.C.section 360bbb-3(b)(1), unless the authorization is terminated  or revoked sooner.       Influenza A by PCR NEGATIVE NEGATIVE Final   Influenza B by PCR NEGATIVE NEGATIVE Final    Comment: (NOTE) The Xpert Xpress SARS-CoV-2/FLU/RSV plus assay is intended as an aid in the diagnosis of influenza from Nasopharyngeal swab specimens and should not be used as a sole basis for treatment. Nasal washings and aspirates are unacceptable for Xpert Xpress SARS-CoV-2/FLU/RSV testing.  Fact Sheet for Patients: BloggerCourse.com  Fact Sheet for Healthcare Providers: SeriousBroker.it  This test is not yet approved or cleared by the [place] FDA and has been authorized for detection and/or diagnosis of SARS-CoV-2 by FDA under an Emergency Use Authorization (EUA). This EUA will remain in effect (meaning this test can be used) for the duration of the COVID-19 declaration under Section 564(b)(1) of the Act, 21 U.S.C. section 360bbb-3(b)(1), unless the authorization is terminated or revoked.  Performed at [facility], [address] [place], [place] [address]      Radiological Exams on Admission: CT Lumbar Spine Wo Contrast  Result Date: 08/21/2020 CLINICAL DATA:  Fall today.  Low back and bilateral hip pain. EXAM: CT LUMBAR SPINE  WITHOUT CONTRAST TECHNIQUE: Multidetector CT imaging of the lumbar spine was performed without intravenous contrast administration. Multiplanar CT image reconstructions were also generated. COMPARISON:  Lumbar spine radiographs 08/01/2020 and earlier. Lumbar CT myelogram 07/18/2019. FINDINGS: Segmentation: 5 lumbar type vertebrae. Alignment: 6 mm anterolisthesis of L4 on L5. Vertebrae: Displaced fracture or involving the superior and anterior aspects of the L1 vertebral body with 55% anterior vertebral body height loss, new from the 07/2019 CT but chronic in appearance and present on multiple interval radiographs including 12/05/2019. Displaced, healed fracture of the L5 vertebral body, also new from 07/2019 but present on interval radiographs. L2-L5 posterior and interbody fusion with subsidence of the interbody spacer at L4-5 in the setting of the chronic L5 vertebral fracture. Minimal lucency surrounding both L5 pedicle screws. Slight traversal of the lateral recess by the right L5 screw. Solid interbody osseous fusion at L2-3 and L3-4 without solid interbody fusion at L4-5. Solid posterolateral osseous fusion at each of the operative levels. Chronic interbody and facet ankylosis at L5-S1. No acute fracture or suspicious osseous lesion. Paraspinal and other soft tissues: Hiatal hernia. Abdominal aortic atherosclerosis without aneurysm. Atelectasis and possible trace pleural effusion in the left lung base. Disc levels: T10-11: Disc bulging, endplate spurring, and mild facet arthrosis result in mild bilateral neural foraminal stenosis without evidence of significant spinal stenosis. T11-12: Disc bulging and mild-to-moderate left facet arthrosis without evidence of significant stenosis. T12-L1: Disc bulging with a large, broad posterior osteophyte or calcified disc herniation and moderate right and severe left facet arthrosis result in moderate spinal stenosis, left greater than right lateral recess stenosis, and  moderate to severe bilateral neural foraminal stenosis, progressed from the prior CT. L1-2: Mild disc bulging and mild facet and ligamentum flavum hypertrophy without evidence of significant stenosis. L2-3: Interval fusion with improved spinal canal patency compared to the prior CT. Moderate left greater than right neural foraminal stenosis due to disc bulging, endplate spurring, and asymmetric left facet hypertrophy. L3-4: Interval fusion with improved spinal canal patency compared to the prior CT. Moderate left neural foraminal stenosis due to disc bulging, endplate spurring, and asymmetric left facet hypertrophy. L4-5: Fusion with limited assessment of the spinal canal due to streak artifact. Moderate to severe bilateral neural  foraminal stenosis due to disc bulging, endplate spurring, and facet hypertrophy. L5-S1: Chronic interbody and facet ankylosis. No evidence of significant spinal stenosis. Mild to moderate osseous neural foraminal stenosis bilaterally. IMPRESSION: 1. No acute osseous abnormality identified in the lumbar spine. 2. Chronic fractures of the L1 and L5 vertebral bodies. 3. L2-L5 posterior and interbody fusion with residual neural foraminal stenosis as above. 4. Moderate spinal stenosis and moderate to severe bilateral neural foraminal stenosis at T12-L1, progressed from 07/2019. 5. Aortic Atherosclerosis (ICD10-I70.0). Electronically Signed   By: Sebastian Ache M.D.   On: 08/21/2020 13:18   CT PELVIS WO CONTRAST  Result Date: 08/21/2020 CLINICAL DATA:  Bilateral hip pain after fall. EXAM: CT PELVIS WITHOUT CONTRAST TECHNIQUE: Multidetector CT imaging of the pelvis was performed following the standard protocol without intravenous contrast. COMPARISON:  None. FINDINGS: Urinary Tract:  No abnormality visualized. Bowel:  Unremarkable visualized pelvic bowel loops. Vascular/Lymphatic: No pathologically enlarged lymph nodes. No significant vascular abnormality seen. Reproductive: Status post  hysterectomy. No adnexal abnormality is noted. Other:  None. Musculoskeletal: No suspicious bone lesions identified. No fracture is noted. IMPRESSION: No fracture or other significant bony abnormality is noted. Electronically Signed   By: Lupita Raider M.D.   On: 08/21/2020 13:18   DG Hips Bilat W or Wo Pelvis 3-4 Views  Result Date: 08/21/2020 CLINICAL DATA:  Pain after trauma. EXAM: DG HIP (WITH OR WITHOUT PELVIS) 3-4V BILAT COMPARISON:  None. FINDINGS: AP view of the pelvis and AP/frog leg views of both hips. Incompletely imaged lumbar spine fixation. Left greater than right sacroiliac joint sclerosis which is likely degenerative. No acute fracture or dislocation. IMPRESSION: No acute osseous abnormality. Electronically Signed   By: Jeronimo Greaves M.D.   On: 08/21/2020 11:36     Assessment/Plan Principal Problem:   Back pain Active Problems:   Hypothyroidism   Hyperlipidemia, mixed   Essential hypertension   Low back pain    1. Low back pain with radiation to the left lower extremity -appreciate neurosurgery consult.  We will keep patient on pain to be medication muscle relaxant physical therapy consult may need rehab. 2. Epigastric discomfort CT abdomen pelvis unremarkable.  We will keep patient on Protonix.  Check troponin. 3. Hypothyroidism on Synthroid. 4. Hypertension with history of CHF on carvedilol.  Appears compensated. 5. Diabetes mellitus type 2 will be continued with NovoLog 70/30 6 units twice daily if blood sugars are acceptable.   DVT prophylaxis: Lovenox. Code Status: Full code. Family Communication: Discussed with patient. Disposition Plan: Home. Consults called: Neurosurgery and physical therapy. Admission status: Observation.   Eduard Clos MD [facility] Pager [phone].  If 7PM-7AM, please contact night-coverage [email or web address] Password [facility]  08/21/2020, 10:25 PM

## 2020-08-22 ENCOUNTER — Other Ambulatory Visit: Payer: Self-pay

## 2020-08-22 ENCOUNTER — Observation Stay (HOSPITAL_COMMUNITY): Payer: Medicare PPO

## 2020-08-22 DIAGNOSIS — M545 Low back pain, unspecified: Secondary | ICD-10-CM | POA: Diagnosis not present

## 2020-08-22 LAB — CBC
HCT: 35.6 % — ABNORMAL LOW (ref 36.0–46.0)
Hemoglobin: 11.8 g/dL — ABNORMAL LOW (ref 12.0–15.0)
MCH: 32.7 pg (ref 26.0–34.0)
MCHC: 33.1 g/dL (ref 30.0–36.0)
MCV: 98.6 fL (ref 80.0–100.0)
Platelets: 292 10*3/uL (ref 150–400)
RBC: 3.61 MIL/uL — ABNORMAL LOW (ref 3.87–5.11)
RDW: 14.4 % (ref 11.5–15.5)
WBC: 10.1 10*3/uL (ref 4.0–10.5)
nRBC: 0 % (ref 0.0–0.2)

## 2020-08-22 LAB — BASIC METABOLIC PANEL
Anion gap: 6 (ref 5–15)
BUN: 14 mg/dL (ref 8–23)
CO2: 27 mmol/L (ref 22–32)
Calcium: 8.7 mg/dL — ABNORMAL LOW (ref 8.9–10.3)
Chloride: 99 mmol/L (ref 98–111)
Creatinine, Ser: 1.12 mg/dL — ABNORMAL HIGH (ref 0.44–1.00)
GFR, Estimated: 53 mL/min — ABNORMAL LOW (ref >60)
Glucose, Bld: 252 mg/dL — ABNORMAL HIGH (ref 70–99)
Potassium: 4.3 mmol/L (ref 3.5–5.1)
Sodium: 132 mmol/L — ABNORMAL LOW (ref 135–145)

## 2020-08-22 LAB — GLUCOSE, CAPILLARY
Glucose-Capillary: 245 mg/dL — ABNORMAL HIGH (ref 70–99)
Glucose-Capillary: 199 mg/dL — ABNORMAL HIGH (ref 70–99)
Glucose-Capillary: 154 mg/dL — ABNORMAL HIGH (ref 70–99)
Glucose-Capillary: 163 mg/dL — ABNORMAL HIGH (ref 70–99)

## 2020-08-22 LAB — TROPONIN I (HIGH SENSITIVITY)
Troponin I (High Sensitivity): 17 ng/L (ref <18)
Troponin I (High Sensitivity): 25 ng/L — ABNORMAL HIGH (ref <18)

## 2020-08-22 MED ORDER — ONDANSETRON HCL 4 MG/2ML IJ SOLN: 4.0000 mg | Freq: Four times a day (QID) | INTRAMUSCULAR | Status: DC | PRN

## 2020-08-22 MED ORDER — MORPHINE SULFATE (PF) 2 MG/ML IV SOLN: 2.0000 mg | INTRAVENOUS | Status: DC | PRN

## 2020-08-22 MED ORDER — ALPRAZOLAM 0.5 MG PO TABS
1.0000 mg | ORAL_TABLET | Freq: Three times a day (TID) | ORAL | Status: DC | PRN
  Administered 2020-08-23 – 2020-08-28 (×8): 1 mg via ORAL
  Filled 2020-08-22 (×8): qty 2

## 2020-08-22 MED ORDER — POLYETHYLENE GLYCOL 3350 17 G PO PACK
17.0000 g | PACK | Freq: Every day | ORAL | Status: DC
  Administered 2020-08-22 – 2020-08-29 (×7): 17 g via ORAL
  Filled 2020-08-22 (×7): qty 1

## 2020-08-22 MED ORDER — SENNA 8.6 MG PO TABS
1.0000 | ORAL_TABLET | Freq: Every day | ORAL | Status: DC
  Administered 2020-08-22: 8.6 mg via ORAL
  Filled 2020-08-22: qty 1

## 2020-08-22 MED ORDER — ONDANSETRON HCL 4 MG/2ML IJ SOLN: 4.0000 mg | Freq: Three times a day (TID) | INTRAMUSCULAR | Status: DC

## 2020-08-22 MED ORDER — ALPRAZOLAM 0.5 MG PO TABS
0.5000 mg | ORAL_TABLET | Freq: Once | ORAL | Status: AC
  Administered 2020-08-22: 0.5 mg via ORAL
  Filled 2020-08-22: qty 1

## 2020-08-22 MED ORDER — MORPHINE SULFATE (PF) 2 MG/ML IV SOLN
1.0000 mg | Freq: Once | INTRAVENOUS | Status: AC
  Administered 2020-08-22: 1 mg via INTRAVENOUS
  Filled 2020-08-22: qty 1

## 2020-08-22 MED ORDER — DIAZEPAM 2 MG PO TABS
2.0000 mg | ORAL_TABLET | Freq: Four times a day (QID) | ORAL | Status: DC | PRN
  Administered 2020-08-22: 2 mg via ORAL
  Filled 2020-08-22: qty 1

## 2020-08-22 MED ORDER — GABAPENTIN 300 MG PO CAPS
600.0000 mg | ORAL_CAPSULE | Freq: Two times a day (BID) | ORAL | Status: DC
  Administered 2020-08-22 – 2020-08-29 (×14): 600 mg via ORAL
  Filled 2020-08-22 (×14): qty 2

## 2020-08-22 MED ORDER — HYDROCODONE-ACETAMINOPHEN 5-325 MG PO TABS
1.0000 | ORAL_TABLET | ORAL | Status: DC | PRN
  Administered 2020-08-22 – 2020-08-24 (×7): 1 via ORAL
  Filled 2020-08-22 (×8): qty 1

## 2020-08-22 MED ORDER — HYDROMORPHONE HCL 1 MG/ML IJ SOLN
0.5000 mg | INTRAMUSCULAR | Status: DC | PRN
  Administered 2020-08-22 – 2020-08-23 (×3): 1 mg via INTRAVENOUS
  Filled 2020-08-22 (×5): qty 1

## 2020-08-22 MED ORDER — METHOCARBAMOL 500 MG PO TABS
500.0000 mg | ORAL_TABLET | Freq: Three times a day (TID) | ORAL | Status: DC
  Administered 2020-08-22 – 2020-08-25 (×10): 500 mg via ORAL
  Filled 2020-08-22 (×10): qty 1

## 2020-08-22 MED ORDER — PANTOPRAZOLE SODIUM 40 MG PO TBEC
40.0000 mg | DELAYED_RELEASE_TABLET | Freq: Every day | ORAL | Status: DC
  Administered 2020-08-22 – 2020-08-29 (×8): 40 mg via ORAL
  Filled 2020-08-22 (×8): qty 1

## 2020-08-22 MED ORDER — SENNOSIDES-DOCUSATE SODIUM 8.6-50 MG PO TABS
1.0000 | ORAL_TABLET | Freq: Two times a day (BID) | ORAL | Status: DC
  Administered 2020-08-22 – 2020-08-23 (×3): 1 via ORAL
  Filled 2020-08-22 (×3): qty 1

## 2020-08-22 MED ORDER — HYDROMORPHONE HCL 1 MG/ML IJ SOLN: 0.5000 mg | Freq: Once | INTRAMUSCULAR | Status: DC

## 2020-08-22 NOTE — Progress Notes (Signed)
Declines going to CT at this time

## 2020-08-22 NOTE — Progress Notes (Signed)
Unable to complete skin assessment pt in too much pain wouldn't allow [place] to turn or remove her pants

## 2020-08-22 NOTE — Plan of Care (Signed)
?  Problem: Activity: ?Goal: Risk for activity intolerance will decrease ?Outcome: Progressing ?  ?Problem: Safety: ?Goal: Ability to remain free from injury will improve ?Outcome: Progressing ?  ?Problem: Pain Managment: ?Goal: General experience of comfort will improve ?Outcome: Progressing ?  ?

## 2020-08-22 NOTE — Progress Notes (Signed)
Subjective: Patient reports that she is in excruciating pain and does not feel that her pain is being well managed. She has complaints that the morphine makes her sick and requested to have it removed from her medication list.   Objective: Vital signs in last 24 hours: Temp:  [98.1 F (36.7 C)-98.9 F (37.2 C)] 98.4 F (36.9 C) (04/20 0735) Pulse Rate:  [74-104] 104 (04/20 0735) Resp:  [10-24] 18 (04/20 0735) BP: (142-190)/(65-94) 190/94 (04/20 0735) SpO2:  [91 %-100 %] 97 % (04/20 0735) Weight:  [109.3 kg] 109.3 kg (04/20 0040)  Intake/Output from previous day: 04/19 0701 - 04/20 0700 In: 50 [IV Piggyback:50] Out: -  Intake/Output this shift: No intake/output data recorded.  Physical Exam: Patient is awake, A/O X 4, and conversant. She is in a significant amount of pain during my examination and was unable to participate in testing of her motor strength. She does MAE and has intact sensation. DTR WNL. Speech is fluent and appropriate.    Lab Results: Recent Labs    08/21/20 1749 08/22/20 0008  WBC 10.6* 10.1  HGB 11.6* 11.8*  HCT 34.5* 35.6*  PLT 291 292   BMET Recent Labs    08/21/20 1749 08/22/20 0008  NA 133* 132*  K 3.9 4.3  CL 99 99  CO2 25 27  GLUCOSE 162* 252*  BUN 13 14  CREATININE 1.04* 1.12*  CALCIUM 8.6* 8.7*    Studies/Results: CT Lumbar Spine Wo Contrast  Result Date: 08/21/2020 CLINICAL DATA:  Fall today.  Low back and bilateral hip pain. EXAM: CT LUMBAR SPINE WITHOUT CONTRAST TECHNIQUE: Multidetector CT imaging of the lumbar spine was performed without intravenous contrast administration. Multiplanar CT image reconstructions were also generated. COMPARISON:  Lumbar spine radiographs 08/01/2020 and earlier. Lumbar CT myelogram 07/18/2019. FINDINGS: Segmentation: 5 lumbar type vertebrae. Alignment: 6 mm anterolisthesis of L4 on L5. Vertebrae: Displaced fracture or involving the superior and anterior aspects of the L1 vertebral body with 55% anterior  vertebral body height loss, new from the 07/2019 CT but chronic in appearance and present on multiple interval radiographs including 12/05/2019. Displaced, healed fracture of the L5 vertebral body, also new from 07/2019 but present on interval radiographs. L2-L5 posterior and interbody fusion with subsidence of the interbody spacer at L4-5 in the setting of the chronic L5 vertebral fracture. Minimal lucency surrounding both L5 pedicle screws. Slight traversal of the lateral recess by the right L5 screw. Solid interbody osseous fusion at L2-3 and L3-4 without solid interbody fusion at L4-5. Solid posterolateral osseous fusion at each of the operative levels. Chronic interbody and facet ankylosis at L5-S1. No acute fracture or suspicious osseous lesion. Paraspinal and other soft tissues: Hiatal hernia. Abdominal aortic atherosclerosis without aneurysm. Atelectasis and possible trace pleural effusion in the left lung base. Disc levels: T10-11: Disc bulging, endplate spurring, and mild facet arthrosis result in mild bilateral neural foraminal stenosis without evidence of significant spinal stenosis. T11-12: Disc bulging and mild-to-moderate left facet arthrosis without evidence of significant stenosis. T12-L1: Disc bulging with a large, broad posterior osteophyte or calcified disc herniation and moderate right and severe left facet arthrosis result in moderate spinal stenosis, left greater than right lateral recess stenosis, and moderate to severe bilateral neural foraminal stenosis, progressed from the prior CT. L1-2: Mild disc bulging and mild facet and ligamentum flavum hypertrophy without evidence of significant stenosis. L2-3: Interval fusion with improved spinal canal patency compared to the prior CT. Moderate left greater than right neural foraminal stenosis due  to disc bulging, endplate spurring, and asymmetric left facet hypertrophy. L3-4: Interval fusion with improved spinal canal patency compared to the prior  CT. Moderate left neural foraminal stenosis due to disc bulging, endplate spurring, and asymmetric left facet hypertrophy. L4-5: Fusion with limited assessment of the spinal canal due to streak artifact. Moderate to severe bilateral neural foraminal stenosis due to disc bulging, endplate spurring, and facet hypertrophy. L5-S1: Chronic interbody and facet ankylosis. No evidence of significant spinal stenosis. Mild to moderate osseous neural foraminal stenosis bilaterally. IMPRESSION: 1. No acute osseous abnormality identified in the lumbar spine. 2. Chronic fractures of the L1 and L5 vertebral bodies. 3. L2-L5 posterior and interbody fusion with residual neural foraminal stenosis as above. 4. Moderate spinal stenosis and moderate to severe bilateral neural foraminal stenosis at T12-L1, progressed from 07/2019. 5. Aortic Atherosclerosis (ICD10-I70.0). Electronically Signed   By: Sebastian Ache M.D.   On: 08/21/2020 13:18   CT PELVIS WO CONTRAST  Result Date: 08/21/2020 CLINICAL DATA:  Bilateral hip pain after fall. EXAM: CT PELVIS WITHOUT CONTRAST TECHNIQUE: Multidetector CT imaging of the pelvis was performed following the standard protocol without intravenous contrast. COMPARISON:  None. FINDINGS: Urinary Tract:  No abnormality visualized. Bowel:  Unremarkable visualized pelvic bowel loops. Vascular/Lymphatic: No pathologically enlarged lymph nodes. No significant vascular abnormality seen. Reproductive: Status post hysterectomy. No adnexal abnormality is noted. Other:  None. Musculoskeletal: No suspicious bone lesions identified. No fracture is noted. IMPRESSION: No fracture or other significant bony abnormality is noted. Electronically Signed   By: Lupita Raider M.D.   On: 08/21/2020 13:18   CT NO CHARGE  Result Date: 08/22/2020 CLINICAL DATA:  Flank pain with kidney stone suspected EXAM: CT ADDITIONAL VIEWS AT NO CHARGE TECHNIQUE: Open field of view to cover the abdominal soft tissues was performed.  CONTRAST:  None COMPARISON:  None similar FINDINGS: The pelvis is incompletely covered. Biventricular pacer. Small sliding hiatal hernia. Atelectasis at the bases. No hydronephrosis or visible urolithiasis. The visualized bladder is distended. Enteric contrast is seen in the mid colon. No evidence of bowel obstruction or inflammation. Atheromatous calcification. Osseous findings are described separately. IMPRESSION: No acute finding in the covered abdominal and pelvic viscera. No hydronephrosis or visible ureteral calculus (the lower ureters are incompletely covered). Electronically Signed   By: Marnee Spring M.D.   On: 08/22/2020 04:40   DG Hips Bilat W or Wo Pelvis 3-4 Views  Result Date: 08/21/2020 CLINICAL DATA:  Pain after trauma. EXAM: DG HIP (WITH OR WITHOUT PELVIS) 3-4V BILAT COMPARISON:  None. FINDINGS: AP view of the pelvis and AP/frog leg views of both hips. Incompletely imaged lumbar spine fixation. Left greater than right sacroiliac joint sclerosis which is likely degenerative. No acute fracture or dislocation. IMPRESSION: No acute osseous abnormality. Electronically Signed   By: Jeronimo Greaves M.D.   On: 08/21/2020 11:36    Assessment/Plan: Patient continues to have intractable low back pain that radiates into her bilateral hips. After evaluating her imaging and discussing her case with multiple neurosurgeons, there is no surgical intervention that we feel would help to relieve the patient's symptoms. Due to the patient's comorbidities, and symptoms that have been refractory to treatment thus far, I would recommend a palliative care consult. Continue working on pain control and encouraging mobility. Continue PT/OT with goal of SNF placement once medically ready. I appreciate the help from the [facility] team in managing this patient.   LOS: 0 days     Council Mechanic, DNP, NP-C 08/22/2020,  8:22 AM

## 2020-08-22 NOTE — Evaluation (Signed)
Physical Therapy Evaluation Patient Details Name: Dana Grant MRN: [id number] DOB: [birth year] Today's Date: 08/22/2020   History of Present Illness  Pt is 70 yo female with chronic low back pain had a fall about 4 to 5 days ago since then the pain is acutely worsened radiating to left lower extremity. Imaging revealed chronic L1 and L5 fx, L2-5 fusion.  Neurosurgery consulted and recommended pain management but no surgical intervention at this time per notes.  However, pt/family reports discussion of vertebroplasty with doctor today(no MD note entered for today yet).  Pt with PMH including but not limited to L2-5 fusion 7/21, recent stomach bug with intense vomitting hospitalized 4/3-4/9, chronic back pain, DM, ADIC, CAD, CHF, HTN, MI, OSA, depressive disorder    Clinical Impression  Pt admitted with above diagnosis. Pt with chronic back pain that has acutely worsened over the past month to the point where she is unable to mobilize.  Today, pt with severe pain and spasms -unable to tolerate any mobility.  Therapy session focused on relaxation techniques and positioning to ease pain.  Pain decreased post session. Pt did have pain meds prior to session but did not help improve pain with mobility.  Per notes , no surgery planned at this time but family reports there was discussion of vertebroplasty.  Pt will need further therapy to progress as able and likely SNF at d/c.  Spouse initially asking about CIR but after further discussion with understanding that pt would be unable to tolerate 3 hr of therapy required for inpt rehab at this time.  Pt currently with functional limitations due to the deficits listed below (see PT Problem List). Pt will benefit from skilled PT to increase their independence and safety with mobility to allow discharge to the venue listed below.       Follow Up Recommendations SNF    Equipment Recommendations  Other (comment) (needs further assessment - w/c, hospital bed,  hoyer)    Recommendations for Other Services       Precautions / Restrictions Precautions Precautions: Fall;Other (comment) Precaution Comments: Intense Pain      Mobility  Bed Mobility Overal bed mobility: Needs Assistance Bed Mobility: Rolling Rolling: Max assist         General bed mobility comments: Pt reports severe pain and unable to tolerate supine to sit.  Noted pt slid down in bed and rotated pelvis.  Placed bed in trendlenberg position and using bed pad and pt's UE able to slide up in bed.  Then raised bed.  PT able to use bed pad and reposition pt's hips level. Elevated knees of bed and placed pillow and 2 blankets under L LE to take pressure off hip and back.  Pt reports significant improvement in pain and spasms with positioning.    Transfers                 General transfer comment: unable to tolerate  Ambulation/Gait                Stairs            Wheelchair Mobility    Modified Rankin (Stroke Patients Only)       Balance Overall balance assessment: History of Falls (Unable to assess due to pain)  Pertinent Vitals/Pain Pain Assessment: Faces Pain Score: 10-Worst pain ever Faces Pain Scale: Hurts worst Pain Location: low back around L pelvis/abdomen/ down L leg Pain Descriptors / Indicators: Burning;Shooting;Sharp;Spasm;Moaning;Grimacing (Some pain at rest but severe pain for any movement and with spasms) Pain Intervention(s): Limited activity within patient's tolerance;Monitored during session;Repositioned;Relaxation;Ice applied    Home Living Family/patient expects to be discharged to:: Private residence Living Arrangements: Spouse/significant other Available Help at Discharge: Family;Available 24 hours/day Type of Home: House Home Access: Stairs to enter Entrance Stairs-Rails: None Entrance Stairs-Number of Steps: 2 Home Layout: Two level;Able to live on main  level with bedroom/bathroom Home Equipment: Walker - 2 wheels;Hand held shower head;Grab bars - tub/shower;Cane - single point;Walker - 4 wheels      Prior Function Level of Independence: Needs assistance         Comments: Pt with limited mobility recently due to pain.  Prior to back pain was independent.  She initially had Back surgery in 11/2019 and pain improved, then worsened around 12/21 around the time home health therapy stopped, then worsened further and returned to [place] 2/22 and started outpt PT with no improvement, then had stomach bug with intense vomitting 4/3-4/9 and back pain worsened, hospitalized for pain and UTI 4/13-14, fell when she went home and pain then completely out of control. In February, when she was doing outpt PT she was able to walk in house, out to car, steps, with RW, and doing ADLs independently but with pain.  Then in past 4 weeks has had very limited mobility due to pain - spouse assisting with stand pivots to rollator seat, no ambulation, assist with ADLs.     Hand Dominance        Extremity/Trunk Assessment   Upper Extremity Assessment Upper Extremity Assessment: Overall WFL for tasks assessed    Lower Extremity Assessment Lower Extremity Assessment: Generalized weakness (Limited due to pain, able to move feet, minimal knee flexion ROM, unable to MMT but demonstrating 1/5 throughout, again limited by pain)    Cervical / Trunk Assessment Cervical / Trunk Assessment:  (unable to assess well in supine)  Communication      Cognition Arousal/Alertness: Awake/alert Behavior During Therapy: WFL for tasks assessed/performed Overall Cognitive Status: Within Functional Limits for tasks assessed                                        General Comments General comments (skin integrity, edema, etc.): Pt's spouse present and supportive.  Increased time spent educating on positioning and PT role/POC.  Pt assisted with relaxation techniques  and gentle pertubations to L LE to help relax then positioned with pillows to support.  Pt's spouse reporting possible rehab here in hospital at d/c.  Educated on difference between CIR and SNF.  Given pt's pain do not feel that pt could tolerate the 3 hr required for inpatient rehab.  Pt and spouse agrees.  Discussed will recommend SNF.    Exercises     Assessment/Plan    PT Assessment Patient needs continued PT services  PT Problem List Decreased strength;Decreased mobility;Decreased safety awareness;Decreased range of motion;Decreased activity tolerance;Decreased balance;Pain;Decreased knowledge of use of DME       PT Treatment Interventions DME instruction;Therapeutic activities;Modalities;Gait training;Therapeutic exercise;Patient/family education;Balance training;Functional mobility training;Neuromuscular re-education;Manual techniques    PT Goals (Current goals can be found in the Care Plan section)  Acute Rehab PT Goals Patient  Stated Goal: decrease pain PT Goal Formulation: With patient/family Time For Goal Achievement: 09/05/20 Potential to Achieve Goals: Fair Additional Goals Additional Goal #1: Will decrease pain to 5/10 with mobility to allow for transfers    Frequency Min 3X/week   Barriers to discharge   pain    Co-evaluation               AM-PAC PT "6 Clicks" Mobility  Outcome Measure Help needed turning from your back to your side while in a flat bed without using bedrails?: Total Help needed moving from lying on your back to sitting on the side of a flat bed without using bedrails?: Total Help needed moving to and from a bed to a chair (including a wheelchair)?: Total Help needed standing up from a chair using your arms (e.g., wheelchair or bedside chair)?: Total Help needed to walk in hospital room?: Total Help needed climbing 3-5 steps with a railing? : Total 6 Click Score: 6    End of Session   Activity Tolerance: Patient limited by pain Patient  left: in bed;with call bell/phone within reach;with bed alarm set;with family/visitor present Nurse Communication: Mobility status PT Visit Diagnosis: Other abnormalities of gait and mobility (R26.89);History of falling (Z91.81);Pain Pain - part of body:  (back , pelvis, leg)    Time: 4982-6415 PT Time Calculation (min) (ACUTE ONLY): 46 min   Charges:   PT Evaluation $PT Eval Low Complexity: 1 Low PT Treatments $Therapeutic Activity: 23-37 mins        Anise Salvo, PT Acute Rehab Services Pager [phone] [facility] [phone]    Rayetta Humphrey 08/22/2020, 3:34 PM

## 2020-08-22 NOTE — Progress Notes (Signed)
[facility] Progress Note  Patient: Dana Grant    JKD:[id number]  DOA: 08/21/2020     Date of Service: the patient was seen and examined on 08/22/2020  Brief hospital course: Past medical history of CAD, chronic HFrEF, AICD implant, HTN, HLD, hiatal hernia, hypothyroidism, type II DM, OSA on CPAP, B12 deficiency presents with worsening chronic lower back pain. Currently plan is pain control per neurosurgery no intervention..  Assessment and Plan: 1.  Intractable lower back pain. L1 and L5 chronic fracture. L2 L5 posterior fusion with foraminal stenosis. Moderate to severe bilateral foraminal stenosis T12-L1. Neurosurgery consulted appreciate assistance. Recommend no intervention right now. Neurosurgery consulted palliative care for pain management. Currently on Dilaudid, Norco, Neurontin, Robaxin.  2.  Chronic anxiety Continue Xanax home regimen.  3.  HTN Chronic systolic CHF Currently appears euvolemic. Continue home regimen.  4.  Hypothyroidism Continue Synthroid.  5.  Elevated troponin Demand ischemia in the setting of hypertension and pain. No chest pain or shortness of breath or anginal equivalent symptoms. Monitor.  6.  Obesity OSA CPAP nightly.  Body mass index is 38.89 kg/m.   Diet: Cardiac diet DVT Prophylaxis:   enoxaparin (LOVENOX) injection 40 mg Start: 08/22/20 0800    Advance goals of care discussion: Full code  Family Communication: family was present at bedside, at the time of interview.  The pt provided permission to discuss medical plan with the family. Opportunity was given to ask question and all questions were answered satisfactorily.   Disposition:  Status is: Observation  Dispo: The patient is from: Home              Anticipated d/c is to: SNF              Patient currently is not medically stable to d/c.   Difficult to place patient   Subjective: Continues to have severe pain.  No nausea no vomiting Unable to tolerate  morphine with GI symptoms.  Physical Exam:  General: Appear in moderate distress, no Rash; Oral Mucosa Clear, moist. no Abnormal Neck Mass Or lumps, Conjunctiva normal  Cardiovascular: S1 and S2 Present, no Murmur, Respiratory: good respiratory effort, Bilateral Air entry present and CTA, no Crackles, no wheezes Abdomen: Bowel Sound present, Soft and no tenderness Extremities: no Pedal edema Neurology: alert and oriented to time, place, and person affect appropriate. no new focal deficit Gait not checked due to patient safety concerns  Vitals:   08/21/20 2345 08/22/20 0040 08/22/20 0735 08/22/20 1945  BP: (!) 175/83 (!) 172/73 (!) 190/94 (!) 92/43  Pulse: 82 90 (!) 104 80  Resp: (!) 22 20 18 19   Temp:  98.1 F (36.7 C) 98.4 F (36.9 C) 98.2 F (36.8 C)  TempSrc:  Oral Oral Oral  SpO2: 92% 100% 97% 95%  Weight:  109.3 kg    Height:  5\' 6"  (1.676 m)     No intake or output data in the 24 hours ending 08/22/20 2029 Filed Weights   08/22/20 0040  Weight: 109.3 kg    Data Reviewed: I have personally reviewed and interpreted daily labs, tele strips, imaging. I reviewed all nursing notes, pharmacy notes, vitals, pertinent old records I have discussed plan of care as described above with RN and patient/family.  CBC: Recent Labs  Lab 08/21/20 1749 08/22/20 0008  WBC 10.6* 10.1  NEUTROABS 6.2  --   HGB 11.6* 11.8*  HCT 34.5* 35.6*  MCV 98.9 98.6  PLT 291 292   Basic Metabolic Panel:  Recent Labs  Lab 08/21/20 1749 08/22/20 0008  NA 133* 132*  K 3.9 4.3  CL 99 99  CO2 25 27  GLUCOSE 162* 252*  BUN 13 14  CREATININE 1.04* 1.12*  CALCIUM 8.6* 8.7*    Studies: CT NO CHARGE  Result Date: 08/22/2020 CLINICAL DATA:  Flank pain with kidney stone suspected EXAM: CT ADDITIONAL VIEWS AT NO CHARGE TECHNIQUE: Open field of view to cover the abdominal soft tissues was performed. CONTRAST:  None COMPARISON:  None similar FINDINGS: The pelvis is incompletely covered.  Biventricular pacer. Small sliding hiatal hernia. Atelectasis at the bases. No hydronephrosis or visible urolithiasis. The visualized bladder is distended. Enteric contrast is seen in the mid colon. No evidence of bowel obstruction or inflammation. Atheromatous calcification. Osseous findings are described separately. IMPRESSION: No acute finding in the covered abdominal and pelvic viscera. No hydronephrosis or visible ureteral calculus (the lower ureters are incompletely covered). Electronically Signed   By: Marnee Spring M.D.   On: 08/22/2020 04:40    Scheduled Meds: . carvedilol  6.25 mg Oral BID  . DULoxetine  30 mg Oral BID  . enoxaparin (LOVENOX) injection  40 mg Subcutaneous Q24H  . gabapentin  600 mg Oral BID  . insulin aspart  0-9 Units Subcutaneous TID WC  . insulin aspart protamine- aspart  30 Units Subcutaneous BID WC  . levothyroxine  75 mcg Oral QAC breakfast  . methocarbamol  500 mg Oral TID  . pantoprazole  40 mg Oral Q1200  . polyethylene glycol  17 g Oral Daily  . senna-docusate  1 tablet Oral BID   Continuous Infusions: PRN Meds: acetaminophen **OR** acetaminophen, ALPRAZolam, HYDROcodone-acetaminophen, HYDROmorphone (DILAUDID) injection, ondansetron (ZOFRAN) IV  Time spent: 35 minutes  Author: Lynden Oxford, MD [facility] 08/22/2020 8:29 PM  To reach On-call, see care teams to locate the attending and reach out via [email or web address]. Between 7PM-7AM, please contact night-coverage If you still have difficulty reaching the attending provider, please page the [facility] (Director on Call) for [facility] on amion for assistance.

## 2020-08-23 DIAGNOSIS — M545 Low back pain, unspecified: Secondary | ICD-10-CM | POA: Diagnosis not present

## 2020-08-23 LAB — GLUCOSE, CAPILLARY
Glucose-Capillary: 105 mg/dL — ABNORMAL HIGH (ref 70–99)
Glucose-Capillary: 83 mg/dL (ref 70–99)
Glucose-Capillary: 210 mg/dL — ABNORMAL HIGH (ref 70–99)
Glucose-Capillary: 230 mg/dL — ABNORMAL HIGH (ref 70–99)

## 2020-08-23 MED ORDER — KETOROLAC TROMETHAMINE 15 MG/ML IJ SOLN
15.0000 mg | Freq: Four times a day (QID) | INTRAMUSCULAR | Status: DC
  Administered 2020-08-23 – 2020-08-27 (×17): 15 mg via INTRAVENOUS
  Filled 2020-08-23 (×17): qty 1

## 2020-08-23 MED ORDER — SENNOSIDES-DOCUSATE SODIUM 8.6-50 MG PO TABS
2.0000 | ORAL_TABLET | Freq: Two times a day (BID) | ORAL | Status: DC
  Administered 2020-08-23 – 2020-08-25 (×4): 2 via ORAL
  Filled 2020-08-23 (×4): qty 2

## 2020-08-23 NOTE — Care Management Obs Status (Signed)
MEDICARE OBSERVATION STATUS NOTIFICATION   Patient Details  Name: SHIRON WHETSEL MRN: [id number] Date of Birth: 03/04/1951   Medicare Observation Status Notification Given:   Nat Math, LCSWA 08/23/2020, 2:48 PM

## 2020-08-23 NOTE — [place] FL2 (Signed)
MEDICAID FL2 LEVEL OF CARE SCREENING TOOL     IDENTIFICATION  Patient Name: Dana Grant Birthdate: Oct 26, 1950 Sex: female Admission Date (Current Location): 08/21/2020  [facility] and [place] Number:  Producer, television/film/video and Address:  [facility], [address] [place], [place] [address]      Provider Number: [id number]  Attending Physician Name and Address:  Rolly Salter, MD  Relative Name and Phone Number:  STACI, DACK (Spouse)   [phone] [facility])    Current Level of Care: Hospital Recommended Level of Care: Skilled Nursing Facility Prior Approval Number:    Date Approved/Denied:   PASRR Number:    Discharge Plan: SNF    Current Diagnoses: Patient Active Problem List   Diagnosis Date Noted  . Back pain 08/21/2020  . Low back pain 08/21/2020  . Spondylolisthesis of lumbar region 11/03/2019  . Type II or unspecified type diabetes mellitus without mention of complication, not stated as uncontrolled 07/18/2019  . Major depressive disorder, recurrent episode, moderate (HCC) 07/18/2019  . Other dyspnea and respiratory abnormality 07/18/2019  . Proteinuria 07/18/2019  . Mastodynia 07/18/2019  . Hypothyroidism 07/18/2019  . Gastroparesis 07/18/2019  . Chronic ulcer of other specified site 07/18/2019  . Anxiety state 07/18/2019  . Hyperlipidemia, mixed 09/02/1999  . Essential hypertension 09/02/1999  . Obesity 03/01/1999  . Breast mass 03/01/1999    Orientation RESPIRATION BLADDER Height & Weight     Self,Time,Situation,Place  Normal External catheter Weight: 240 lb 15.4 oz (109.3 kg) Height:  5\' 6"  (167.6 cm)  BEHAVIORAL SYMPTOMS/MOOD NEUROLOGICAL BOWEL NUTRITION STATUS      Continent Diet (See d/c summary)  AMBULATORY STATUS COMMUNICATION OF NEEDS Skin   Extensive Assist Verbally Normal                       Personal Care Assistance Level of Assistance  Bathing,Feeding,Dressing Bathing Assistance:  Maximum assistance Feeding assistance: Independent Dressing Assistance: Maximum assistance     Functional Limitations Info  Sight,Hearing,Speech Sight Info: Adequate Hearing Info: Adequate Speech Info: Adequate    SPECIAL CARE FACTORS FREQUENCY  PT (By licensed PT),OT (By licensed OT)     PT Frequency: 5x/week OT Frequency: 5x/week            Contractures Contractures Info: Not present    Additional Factors Info  Insulin Sliding Scale,Psychotropic Code Status Info: Full code Allergies Info: Sulfa Antibiotics, codeine, lisinopril, morphine and related, Demerol (meperidine), penecillins Psychotropic Info: HYDROcodone-acetaminophen (NORCO/VICODIN) 5-325 MG per tablet 1 tablet, HYDROmorphone (DILAUDID) injection 0.5-1 mg Insulin Sliding Scale Info: insulin aspart (novoLOG) injection 0-9 Units, insulin aspart protamine- aspart (NOVOLOG MIX 70/30) injection 30 Units       Current Medications (08/23/2020):  This is the current hospital active medication list Current Facility-Administered Medications  Medication Dose Route Frequency Provider Last Rate Last Admin  . acetaminophen (TYLENOL) tablet 650 mg  650 mg Oral Q6H PRN 08/25/2020, MD   650 mg at 08/22/20 2122   Or  . acetaminophen (TYLENOL) suppository 650 mg  650 mg Rectal Q6H PRN 2123, MD      . ALPRAZolam Eduard Clos) tablet 1 mg  1 mg Oral TID PRN Prudy Feeler, MD      . carvedilol (COREG) tablet 6.25 mg  6.25 mg Oral BID Rolly Salter, MD   6.25 mg at 08/23/20 0859  . DULoxetine (CYMBALTA) DR capsule 30 mg  30 mg Oral BID 08/25/20  N, MD   30 mg at 08/23/20 0858  . enoxaparin (LOVENOX) injection 40 mg  40 mg Subcutaneous Q24H Eduard Clos, MD   40 mg at 08/23/20 0859  . gabapentin (NEURONTIN) capsule 600 mg  600 mg Oral BID Rolly Salter, MD   600 mg at 08/23/20 0859  . HYDROcodone-acetaminophen (NORCO/VICODIN) 5-325 MG per tablet 1 tablet  1 tablet Oral Q4H PRN Rolly Salter, MD   1 tablet at 08/23/20 1336  . HYDROmorphone (DILAUDID) injection 0.5-1 mg  0.5-1 mg Intravenous Q2H PRN Council Mechanic, NP   1 mg at 08/23/20 0086  . insulin aspart (novoLOG) injection 0-9 Units  0-9 Units Subcutaneous TID WC Eduard Clos, MD   2 Units at 08/22/20 1728  . insulin aspart protamine- aspart (NOVOLOG MIX 70/30) injection 30 Units  30 Units Subcutaneous BID WC Eduard Clos, MD   30 Units at 08/23/20 0900  . ketorolac (TORADOL) 15 MG/ML injection 15 mg  15 mg Intravenous QID Rolly Salter, MD   15 mg at 08/23/20 1336  . levothyroxine (SYNTHROID) tablet 75 mcg  75 mcg Oral QAC breakfast Eduard Clos, MD   75 mcg at 08/23/20 0553  . methocarbamol (ROBAXIN) tablet 500 mg  500 mg Oral TID Rolly Salter, MD   500 mg at 08/23/20 0859  . ondansetron (ZOFRAN) injection 4 mg  4 mg Intravenous Q6H PRN Rolly Salter, MD      . pantoprazole (PROTONIX) EC tablet 40 mg  40 mg Oral Q1200 Eduard Clos, MD   40 mg at 08/23/20 1336  . polyethylene glycol (MIRALAX / GLYCOLAX) packet 17 g  17 g Oral Daily Rolly Salter, MD   17 g at 08/23/20 0859  . senna-docusate (Senokot-S) tablet 2 tablet  2 tablet Oral BID Rolly Salter, MD         Discharge Medications: Please see discharge summary for a list of discharge medications.  Relevant Imaging Results:  Relevant Lab Results:   Additional Information SSN [id number] [place], [place]

## 2020-08-23 NOTE — Progress Notes (Signed)
Pt placed on cpap 

## 2020-08-23 NOTE — Progress Notes (Signed)
Occupational Therapy Evaluation Patient Details Name: Dana Grant MRN: [id number] DOB: [birth year] Today's Date: 08/23/2020    History of Present Illness Pt is 70 yo female with chronic low back pain had a fall about 4 to 5 days ago since then the pain is acutely worsened radiating to left lower extremity. Imaging revealed chronic L1 and L5 fx, L2-5 fusion.  Neurosurgery consulted and recommended pain management but no surgical intervention at this time per notes.  However, pt/family reports discussion of vertebroplasty with doctor today(no MD note entered for today yet).  Pt with PMH including but not limited to L2-5 fusion 7/21, recent stomach bug with intense vomitting hospitalized 4/3-4/9, chronic back pain, DM, ADIC, CAD, CHF, HTN, MI, OSA, depressive disorder   Clinical Impression   Dana Grant was in 10/10 pain upon arrival, Husband present in the room. Dana Grant explained that she has been totally dependent on her husband for all ADL/IADLs for about a month prior to this admission due to intense pack pain, however prior to that she was mod I with all. With any movement Dana Grant was in immense pain, educated on breathing techniques and positioning for back pain relief. Educated on the benefits mental relaxation/distraction as well, pt reported she loves to play solitaire, and this therapist plans to bring adult coloring book into room. Noted pt position was low and unsupported in bed, Max A with trendelenburg to boost pt in bed. Pt reported trendelenburg was a position of comfort. Educated husband on bed controls, and to limit time spent in trendelenburg to 2 minutes at a time. Mod A to reposition legs to support knees with pillows, movement was slow, deliberate and painful. Pt benefits from continued OT to determine strategies to maximize function and decrease pain. Recommend d/c to SNF due to level of pain causing max A levels for all ADLs and mobility.      Follow Up Recommendations  SNF     Equipment Recommendations  3 in 1 bedside commode       Precautions / Restrictions Precautions Precautions: Fall Precaution Comments: Intense Pain Restrictions Weight Bearing Restrictions: No      Mobility Bed Mobility Overal bed mobility: Needs Assistance Bed Mobility: Rolling Rolling: Max assist         General bed mobility comments: Pt unable to tolerate due to severe pain. Noted pt was too low in the bed, used trendelenberg position to assist pt boost up in bed, pt able to relax and use BUE to assist once in trendelenberg position. Pt noted this was a position for comfort for her.    Transfers  Unable to transfer OOB this session due to extreme pain with movement                       Balance Overall balance assessment: History of Falls               ADL either performed or assessed with clinical judgement   ADL Overall ADL's : Needs assistance/impaired Eating/Feeding: Set up;Bed level   Grooming: Set up;Bed level   Upper Body Bathing: Maximal assistance;Bed level   Lower Body Bathing: Maximal assistance;Bed level   Upper Body Dressing : Maximal assistance;Bed level   Lower Body Dressing: Maximal assistance;+2 for physical assistance;Bed level   Toilet Transfer: Maximal assistance;+2 for physical assistance;BSC;Stand-pivot   Toileting- Clothing Manipulation and Hygiene: Maximal assistance;+2 for physical assistance;Sit to/from stand       Functional mobility during ADLs: Maximal assistance;+2 for physical  assistance General ADL Comments: pt extremely limited due to pain. Pt could only tolerate small movements at a time for reposition in bed and required verbal cues for breathing techniques. Pt found slight comfort in trendelenburg position.                  Pertinent Vitals/Pain Pain Assessment: 0-10 Pain Score: 10-Worst pain ever Faces Pain Scale: Hurts worst Pain Location: low back around L pelvis/abdomen/ down L & R legs Pain  Descriptors / Indicators: Burning;Shooting;Sharp;Spasm;Moaning;Grimacing Pain Intervention(s): Limited activity within patient's tolerance;Repositioned;Relaxation;Ice applied;Utilized relaxation techniques     Hand Dominance Right   Extremity/Trunk Assessment Upper Extremity Assessment Upper Extremity Assessment: Overall WFL for tasks assessed (extremely tense upper body due to pain, unable to relax to fully assess ROM and strength)   Lower Extremity Assessment Lower Extremity Assessment: Defer to PT evaluation   Cervical / Trunk Assessment Cervical / Trunk Assessment: Normal   Communication Communication Communication: No difficulties   Cognition Arousal/Alertness: Awake/alert Behavior During Therapy: WFL for tasks assessed/performed Overall Cognitive Status: Within Functional Limits for tasks assessed                   General Comments  pt's spouse was present for this evaluation, assisted and educated with repositioning and pillow placement. Husband educated on bed control for trendelenberg since this was a position of comfort for pt. Pt encouraged to complete arm exercises and relaxion techniques in this position of comfort.            Home Living Family/patient expects to be discharged to:: Private residence Living Arrangements: Spouse/significant other Available Help at Discharge: Family;Available 24 hours/day Type of Home: House Home Access: Stairs to enter [employer] of Steps: 2 Entrance Stairs-Rails: None Home Layout: Two level;Able to live on main level with bedroom/bathroom     Bathroom Shower/Tub: Tub/shower unit   Bathroom Toilet: Handicapped height Bathroom Accessibility: Yes How Accessible: Accessible via walker (rollator) Home Equipment: Walker - 2 wheels;Hand held shower head;Grab bars - tub/shower;Cane - single point;Walker - 4 wheels   Additional Comments: Pt and husband report that pt has been bound to the recliner for over a  month, husband transfers pt to rollator seat, takes her into the bathroom and transfers into toilet stating "sometimes we make it, sometimes we dont."      Prior Functioning/Environment Level of Independence: Needs assistance  Gait / Transfers Assistance Needed: max pivot transfers from surface to surface (recliner to rollator, to toilet) ADL's / Homemaking Assistance Needed: Max/total A   Comments: Pt was indep wtih rollator prior to the start of this year. Pt has history of back sx, UTI, a fall and a recent sickness. No she is dependent on husband for all ADL/IADLs due to intense back pain        OT Problem List: Decreased strength;Decreased range of motion;Decreased activity tolerance;Impaired balance (sitting and/or standing);Decreased knowledge of use of DME or AE;Pain      OT Treatment/Interventions: Self-care/ADL training;Therapeutic exercise;Energy conservation;DME and/or AE instruction;Modalities;Patient/family education;Balance training    OT Goals(Current goals can be found in the care plan section) Acute Rehab OT Goals Patient Stated Goal: decrease pain OT Goal Formulation: With patient Time For Goal Achievement: 09/06/20 Potential to Achieve Goals: Fair ADL Goals Pt Will Perform Lower Body Bathing: with min assist;with adaptive equipment;sit to/from stand Pt Will Perform Upper Body Dressing: with set-up;sitting Pt Will Transfer to Toilet: with min assist;stand pivot transfer;bedside commode Additional ADL Goal #1: Pt will independently utilize  relaxation techniques for pain management  OT Frequency: Min 2X/week    AM-PAC OT "6 Clicks" Daily Activity     Outcome Measure Help from another person eating meals?: None Help from another person taking care of personal grooming?: A Little Help from another person toileting, which includes using toliet, bedpan, or urinal?: A Lot Help from another person bathing (including washing, rinsing, drying)?: A Lot Help from another  person to put on and taking off regular upper body clothing?: A Lot Help from another person to put on and taking off regular lower body clothing?: A Lot 6 Click Score: 15   End of Session Nurse Communication: Mobility status  Activity Tolerance: Patient limited by pain Patient left: in bed;with call bell/phone within reach;with bed alarm set  OT Visit Diagnosis: History of falling (Z91.81);Muscle weakness (generalized) (M62.81);Pain Pain - Right/Left:  (back) Pain - part of body:  (back)                Time: 1039-1100 OT Time Calculation (min): 21 min Charges:  OT General Charges $OT Visit: 1 Visit OT Evaluation $OT Eval Low Complexity: 1 Low    Brady Schiller A Conner Neiss 08/23/2020, 1:22 PM

## 2020-08-23 NOTE — Progress Notes (Signed)
[facility] Progress Note  Patient: Dana Grant    TFT:[id number]  DOA: 08/21/2020     Date of Service: the patient was seen and examined on 08/23/2020  Brief hospital course: Past medical history of CAD, chronic HFrEF, AICD implant, HTN, HLD, hiatal hernia, hypothyroidism, type II DM, OSA on CPAP, B12 deficiency presents with worsening chronic lower back pain. Currently plan is pain control, per neurosurgery no intervention..  Assessment and Plan: 1.  Intractable lower back pain. L1 and L5 chronic fracture. L2 L5 posterior fusion with foraminal stenosis. Moderate to severe bilateral foraminal stenosis T12-L1. Neurosurgery consulted appreciate assistance. Recommend no intervention right now. Neurosurgery consulted palliative care for pain management. Currently on Dilaudid, Norco, Neurontin, Robaxin.  add scheduled Toradol.  PT recommends SNF.  2.  Chronic anxiety Continue Xanax home regimen.  3.  Accelerated hypertension Chronic systolic CHF Currently appears euvolemic. Continue home regimen. Blood pressure was elevated on admission.  Currently improving.  4.  Hypothyroidism Continue Synthroid.  5.  Elevated troponin Demand ischemia in the setting of hypertension and pain. No chest pain or shortness of breath or anginal equivalent symptoms. Monitor. EKG also negative for any acute ischemic changes.  6.  Obesity OSA CPAP nightly.  7.  Constipation. Continue bowel regimen.  Body mass index is 38.89 kg/m.   Diet: Cardiac diet DVT Prophylaxis:   enoxaparin (LOVENOX) injection 40 mg Start: 08/22/20 0800   Advance goals of care discussion: Full code  Family Communication: family was present at bedside, at the time of interview.  The pt provided permission to discuss medical plan with the family. Opportunity was given to ask question and all questions were answered satisfactorily.   Disposition:  Status is: Observation  Dispo: The patient is from:  Home              Anticipated d/c is to: SNF              Patient currently is not medically stable to d/c.   Difficult to place patient   Subjective: Continues to report severe pain.  No nausea or vomiting.  Continues to report constipation.  Physical Exam:  General: Appear in mild distress, no Rash; Oral Mucosa Clear, moist. no Abnormal Neck Mass Or lumps, Conjunctiva normal  Cardiovascular: S1 and S2 Present, no Murmur, Respiratory: good respiratory effort, Bilateral Air entry present and CTA, no Crackles, no wheezes Abdomen: Bowel Sound present, Soft and no tenderness Extremities: no Pedal edema Neurology: alert and oriented to time, place, and person affect appropriate. no new focal deficit Gait not checked due to patient safety concerns  Vitals:   08/22/20 2123 08/22/20 2124 08/23/20 0336 08/23/20 1330  BP: (!) 110/47  (!) 114/48 134/68  Pulse: 82  84 92  Resp:   17 16  Temp:   98.1 F (36.7 C) 98.7 F (37.1 C)  TempSrc:   Oral Oral  SpO2: 91% 93% 94% 95%  Weight:      Height:       No intake or output data in the 24 hours ending 08/23/20 1804 Filed Weights   08/22/20 0040  Weight: 109.3 kg    Data Reviewed: I have personally reviewed and interpreted daily labs, tele strips, imaging. I reviewed all nursing notes, pharmacy notes, vitals, pertinent old records I have discussed plan of care as described above with RN and patient/family.  CBC: Recent Labs  Lab 08/21/20 1749 08/22/20 0008  WBC 10.6* 10.1  NEUTROABS 6.2  --   HGB  11.6* 11.8*  HCT 34.5* 35.6*  MCV 98.9 98.6  PLT 291 292   Basic Metabolic Panel: Recent Labs  Lab 08/21/20 1749 08/22/20 0008  NA 133* 132*  K 3.9 4.3  CL 99 99  CO2 25 27  GLUCOSE 162* 252*  BUN 13 14  CREATININE 1.04* 1.12*  CALCIUM 8.6* 8.7*    Studies: No results found.  Scheduled Meds: . carvedilol  6.25 mg Oral BID  . DULoxetine  30 mg Oral BID  . enoxaparin (LOVENOX) injection  40 mg Subcutaneous Q24H  .  gabapentin  600 mg Oral BID  . insulin aspart  0-9 Units Subcutaneous TID WC  . insulin aspart protamine- aspart  30 Units Subcutaneous BID WC  . ketorolac  15 mg Intravenous QID  . levothyroxine  75 mcg Oral QAC breakfast  . methocarbamol  500 mg Oral TID  . pantoprazole  40 mg Oral Q1200  . polyethylene glycol  17 g Oral Daily  . senna-docusate  2 tablet Oral BID   Continuous Infusions: PRN Meds: acetaminophen **OR** acetaminophen, ALPRAZolam, HYDROcodone-acetaminophen, HYDROmorphone (DILAUDID) injection, ondansetron (ZOFRAN) IV  Time spent: 35 minutes  Author: Lynden Oxford, MD [facility] 08/23/2020 6:04 PM  To reach On-call, see care teams to locate the attending and reach out via [email or web address]. Between 7PM-7AM, please contact night-coverage If you still have difficulty reaching the attending provider, please page the [facility] (Director on Call) for [facility] on amion for assistance.

## 2020-08-23 NOTE — Progress Notes (Signed)
Rehab Admissions Coordinator Note:  Patient was screened by Clois Dupes for appropriateness for an Inpatient Acute Rehab Consult per request Dr. Allena Katz. Patient not at a level to consider for Cir admit for not able to tolerate the intensity required.  At this time, we are recommending Skilled Nursing Facility.  Clois Dupes RN MSN 08/23/2020, 2:18 PM  I can be reached at [phone].

## 2020-08-23 NOTE — Progress Notes (Addendum)
Subjective: Patient reports that she was able to get some rest last night and her pain was well managed. However, this morning she reports that her intense pain has returned. No acute events overnight.   Objective: Vital signs in last 24 hours: Temp:  [98.1 F (36.7 C)-98.4 F (36.9 C)] 98.1 F (36.7 C) (04/21 0336) Pulse Rate:  [80-104] 84 (04/21 0336) Resp:  [17-19] 17 (04/21 0336) BP: (92-190)/(43-94) 114/48 (04/21 0336) SpO2:  [91 %-97 %] 94 % (04/21 0336)  Intake/Output from previous day: No intake/output data recorded. Intake/Output this shift: No intake/output data recorded.  Physical Exam: Patient is awake, A/O X 4, and conversant. She is in a moderate amount of distress due to severe lumbar pain. She does MAE and has intact sensation. DTR WNL. Speech is fluent and appropriate.  Lab Results: Recent Labs    08/21/20 1749 08/22/20 0008  WBC 10.6* 10.1  HGB 11.6* 11.8*  HCT 34.5* 35.6*  PLT 291 292   BMET Recent Labs    08/21/20 1749 08/22/20 0008  NA 133* 132*  K 3.9 4.3  CL 99 99  CO2 25 27  GLUCOSE 162* 252*  BUN 13 14  CREATININE 1.04* 1.12*  CALCIUM 8.6* 8.7*    Studies/Results: CT Lumbar Spine Wo Contrast  Result Date: 08/21/2020 CLINICAL DATA:  Fall today.  Low back and bilateral hip pain. EXAM: CT LUMBAR SPINE WITHOUT CONTRAST TECHNIQUE: Multidetector CT imaging of the lumbar spine was performed without intravenous contrast administration. Multiplanar CT image reconstructions were also generated. COMPARISON:  Lumbar spine radiographs 08/01/2020 and earlier. Lumbar CT myelogram 07/18/2019. FINDINGS: Segmentation: 5 lumbar type vertebrae. Alignment: 6 mm anterolisthesis of L4 on L5. Vertebrae: Displaced fracture or involving the superior and anterior aspects of the L1 vertebral body with 55% anterior vertebral body height loss, new from the 07/2019 CT but chronic in appearance and present on multiple interval radiographs including 12/05/2019. Displaced,  healed fracture of the L5 vertebral body, also new from 07/2019 but present on interval radiographs. L2-L5 posterior and interbody fusion with subsidence of the interbody spacer at L4-5 in the setting of the chronic L5 vertebral fracture. Minimal lucency surrounding both L5 pedicle screws. Slight traversal of the lateral recess by the right L5 screw. Solid interbody osseous fusion at L2-3 and L3-4 without solid interbody fusion at L4-5. Solid posterolateral osseous fusion at each of the operative levels. Chronic interbody and facet ankylosis at L5-S1. No acute fracture or suspicious osseous lesion. Paraspinal and other soft tissues: Hiatal hernia. Abdominal aortic atherosclerosis without aneurysm. Atelectasis and possible trace pleural effusion in the left lung base. Disc levels: T10-11: Disc bulging, endplate spurring, and mild facet arthrosis result in mild bilateral neural foraminal stenosis without evidence of significant spinal stenosis. T11-12: Disc bulging and mild-to-moderate left facet arthrosis without evidence of significant stenosis. T12-L1: Disc bulging with a large, broad posterior osteophyte or calcified disc herniation and moderate right and severe left facet arthrosis result in moderate spinal stenosis, left greater than right lateral recess stenosis, and moderate to severe bilateral neural foraminal stenosis, progressed from the prior CT. L1-2: Mild disc bulging and mild facet and ligamentum flavum hypertrophy without evidence of significant stenosis. L2-3: Interval fusion with improved spinal canal patency compared to the prior CT. Moderate left greater than right neural foraminal stenosis due to disc bulging, endplate spurring, and asymmetric left facet hypertrophy. L3-4: Interval fusion with improved spinal canal patency compared to the prior CT. Moderate left neural foraminal stenosis due to disc bulging,  endplate spurring, and asymmetric left facet hypertrophy. L4-5: Fusion with limited  assessment of the spinal canal due to streak artifact. Moderate to severe bilateral neural foraminal stenosis due to disc bulging, endplate spurring, and facet hypertrophy. L5-S1: Chronic interbody and facet ankylosis. No evidence of significant spinal stenosis. Mild to moderate osseous neural foraminal stenosis bilaterally. IMPRESSION: 1. No acute osseous abnormality identified in the lumbar spine. 2. Chronic fractures of the L1 and L5 vertebral bodies. 3. L2-L5 posterior and interbody fusion with residual neural foraminal stenosis as above. 4. Moderate spinal stenosis and moderate to severe bilateral neural foraminal stenosis at T12-L1, progressed from 07/2019. 5. Aortic Atherosclerosis (ICD10-I70.0). Electronically Signed   By: Logan Bores M.D.   On: 08/21/2020 13:18   CT PELVIS WO CONTRAST  Result Date: 08/21/2020 CLINICAL DATA:  Bilateral hip pain after fall. EXAM: CT PELVIS WITHOUT CONTRAST TECHNIQUE: Multidetector CT imaging of the pelvis was performed following the standard protocol without intravenous contrast. COMPARISON:  None. FINDINGS: Urinary Tract:  No abnormality visualized. Bowel:  Unremarkable visualized pelvic bowel loops. Vascular/Lymphatic: No pathologically enlarged lymph nodes. No significant vascular abnormality seen. Reproductive: Status post hysterectomy. No adnexal abnormality is noted. Other:  None. Musculoskeletal: No suspicious bone lesions identified. No fracture is noted. IMPRESSION: No fracture or other significant bony abnormality is noted. Electronically Signed   By: Marijo Conception M.D.   On: 08/21/2020 13:18   CT NO CHARGE  Result Date: 08/22/2020 CLINICAL DATA:  Flank pain with kidney stone suspected EXAM: CT ADDITIONAL VIEWS AT NO CHARGE TECHNIQUE: Open field of view to cover the abdominal soft tissues was performed. CONTRAST:  None COMPARISON:  None similar FINDINGS: The pelvis is incompletely covered. Biventricular pacer. Small sliding hiatal hernia. Atelectasis at  the bases. No hydronephrosis or visible urolithiasis. The visualized bladder is distended. Enteric contrast is seen in the mid colon. No evidence of bowel obstruction or inflammation. Atheromatous calcification. Osseous findings are described separately. IMPRESSION: No acute finding in the covered abdominal and pelvic viscera. No hydronephrosis or visible ureteral calculus (the lower ureters are incompletely covered). Electronically Signed   By: Monte Fantasia M.D.   On: 08/22/2020 04:40   DG Hips Bilat W or Wo Pelvis 3-4 Views  Result Date: 08/21/2020 CLINICAL DATA:  Pain after trauma. EXAM: DG HIP (WITH OR WITHOUT PELVIS) 3-4V BILAT COMPARISON:  None. FINDINGS: AP view of the pelvis and AP/frog leg views of both hips. Incompletely imaged lumbar spine fixation. Left greater than right sacroiliac joint sclerosis which is likely degenerative. No acute fracture or dislocation. IMPRESSION: No acute osseous abnormality. Electronically Signed   By: Abigail Miyamoto M.D.   On: 08/21/2020 11:36    Assessment/Plan: Awaiting palliative care consult and input. Continue supportive care, aggresive pain control, and encouraging mobility. Continue PT/OT. Will need SNF placement once appropriate. Dr. Vertell Limber met with the patient and her husband today to discuss the patient's current situation and lack of an effective surgery to offer the patient. After their conversation, it was agreed that the patient would be referred for pain management. If pain management is unsuccessful, we will look into epidural steroid injections.     LOS: 0 days     Marvis Moeller, DNP, NP-C 08/23/2020, 7:32 AM

## 2020-08-23 NOTE — TOC Initial Note (Addendum)
Transition of Care [facility]) - Initial/Assessment Note    Patient Details  Name: Dana Grant MRN: [id number] Date of Birth: 1950-06-04  [facility]) CM/SW Contact:    Ralene Bathe, LCSWA Phone Number: 08/23/2020, 3:59 PM  Clinical Narrative:                 CSW received consult for possible SNF placement at time of discharge. CSW spoke with patient and the patient's spouse at bedside.  Patient and spouse expressed understanding of PT recommendation and is agreeable to SNF placement at time of discharge. Patient reports preference for CIR.  If CIR denies, patient would like to go to [facility] in [place].  Patient expressed being hopeful for rehab and to feel better soon. No further questions reported at this time.   14:30-  CSW notified patient of CIR denial.  Patient reports wanting to go to [facility] SNF once pain is manageable.    FL2 was completed and faxed.  CSW notified Maralyn Sago, with admissions at [facility], of the referral. [phone].  Pre-authorization started. reference number [id number].  Expected Discharge Plan: Skilled Nursing Facility Barriers to Discharge: Continued Medical Work up,SNF Pending bed offer,Insurance Authorization   Patient Goals and CMS Choice   CMS Medicare.gov Compare Post Acute Care list provided to:: Patient Choice offered to / list presented to : Patient  Expected Discharge Plan and Services Expected Discharge Plan: Skilled Nursing Facility     Post Acute Care Choice: Skilled Nursing Facility Living arrangements for the past 2 months: Single Family Home                                      Prior Living Arrangements/Services Living arrangements for the past 2 months: Single Family Home Lives with:: Spouse Patient language and need for interpreter reviewed:: Yes Do you feel safe going back to the place where you live?: Yes      Need for Family Participation in Patient Care: Yes (Comment) Care giver support system in  place?: Yes (comment)   Criminal Activity/Legal Involvement Pertinent to Current Situation/Hospitalization: No - Comment as needed  Activities of Daily Living Home Assistive Devices/Equipment: Walker (specify type),Wheelchair ADL Screening (condition at time of admission) Patient's cognitive ability adequate to safely complete daily activities?: Yes Is the patient deaf or have difficulty hearing?: No Does the patient have difficulty seeing, even when wearing glasses/contacts?: No Does the patient have difficulty concentrating, remembering, or making decisions?: No Patient able to express need for assistance with ADLs?: Yes Does the patient have difficulty dressing or bathing?: No Independently performs ADLs?: Yes (appropriate for developmental age) Does the patient have difficulty walking or climbing stairs?: Yes Weakness of Legs: Both Weakness of Arms/Hands: None  Permission Sought/Granted   Permission granted to share information with : Yes, Verbal Permission Granted     Permission granted to share info w AGENCY: SNF        Emotional Assessment Appearance:: Appears stated age Attitude/Demeanor/Rapport: Engaged Affect (typically observed): Calm Orientation: : Oriented to Self,Oriented to Place,Oriented to  Time,Oriented to Situation Alcohol / Substance Use: Not Applicable Psych Involvement: No (comment)  Admission diagnosis:  Low back pain [M54.50] Back pain [M54.9] Fall, initial encounter [W19.XXXA] Patient Active Problem List   Diagnosis Date Noted  . Back pain 08/21/2020  . Low back pain 08/21/2020  . Spondylolisthesis of lumbar region 11/03/2019  . Type II or unspecified  type diabetes mellitus without mention of complication, not stated as uncontrolled 07/18/2019  . Major depressive disorder, recurrent episode, moderate (HCC) 07/18/2019  . Other dyspnea and respiratory abnormality 07/18/2019  . Proteinuria 07/18/2019  . Mastodynia 07/18/2019  . Hypothyroidism  07/18/2019  . Gastroparesis 07/18/2019  . Chronic ulcer of other specified site 07/18/2019  . Anxiety state 07/18/2019  . Hyperlipidemia, mixed 09/02/1999  . Essential hypertension 09/02/1999  . Obesity 03/01/1999  . Breast mass 03/01/1999   PCP:  Pcp, No Pharmacy:   [facility] [address] [place] [address] Phone: [phone] Fax: [phone]     Social Determinants of Health (SDOH) Interventions    Readmission Risk Interventions No flowsheet data found.

## 2020-08-24 DIAGNOSIS — E039 Hypothyroidism, unspecified: Secondary | ICD-10-CM | POA: Diagnosis not present

## 2020-08-24 DIAGNOSIS — N179 Acute kidney failure, unspecified: Secondary | ICD-10-CM | POA: Diagnosis not present

## 2020-08-24 DIAGNOSIS — Z6838 Body mass index (BMI) 38.0-38.9, adult: Secondary | ICD-10-CM | POA: Diagnosis not present

## 2020-08-24 DIAGNOSIS — E669 Obesity, unspecified: Secondary | ICD-10-CM | POA: Diagnosis present

## 2020-08-24 DIAGNOSIS — Z515 Encounter for palliative care: Secondary | ICD-10-CM | POA: Diagnosis not present

## 2020-08-24 DIAGNOSIS — K3184 Gastroparesis: Secondary | ICD-10-CM | POA: Diagnosis present

## 2020-08-24 DIAGNOSIS — Y92009 Unspecified place in unspecified non-institutional (private) residence as the place of occurrence of the external cause: Secondary | ICD-10-CM | POA: Diagnosis not present

## 2020-08-24 DIAGNOSIS — E1143 Type 2 diabetes mellitus with diabetic autonomic (poly)neuropathy: Secondary | ICD-10-CM | POA: Diagnosis present

## 2020-08-24 DIAGNOSIS — Z7189 Other specified counseling: Secondary | ICD-10-CM | POA: Diagnosis not present

## 2020-08-24 DIAGNOSIS — I10 Essential (primary) hypertension: Secondary | ICD-10-CM | POA: Diagnosis not present

## 2020-08-24 DIAGNOSIS — I5022 Chronic systolic (congestive) heart failure: Secondary | ICD-10-CM | POA: Diagnosis not present

## 2020-08-24 DIAGNOSIS — M48061 Spinal stenosis, lumbar region without neurogenic claudication: Secondary | ICD-10-CM | POA: Diagnosis not present

## 2020-08-24 DIAGNOSIS — F419 Anxiety disorder, unspecified: Secondary | ICD-10-CM | POA: Diagnosis not present

## 2020-08-24 DIAGNOSIS — G8929 Other chronic pain: Secondary | ICD-10-CM | POA: Diagnosis present

## 2020-08-24 DIAGNOSIS — M545 Low back pain, unspecified: Secondary | ICD-10-CM | POA: Diagnosis present

## 2020-08-24 DIAGNOSIS — I248 Other forms of acute ischemic heart disease: Secondary | ICD-10-CM | POA: Diagnosis not present

## 2020-08-24 DIAGNOSIS — I110 Hypertensive heart disease with heart failure: Secondary | ICD-10-CM | POA: Diagnosis not present

## 2020-08-24 DIAGNOSIS — Z90710 Acquired absence of both cervix and uterus: Secondary | ICD-10-CM | POA: Diagnosis not present

## 2020-08-24 DIAGNOSIS — I428 Other cardiomyopathies: Secondary | ICD-10-CM | POA: Diagnosis not present

## 2020-08-24 DIAGNOSIS — R531 Weakness: Secondary | ICD-10-CM | POA: Diagnosis not present

## 2020-08-24 DIAGNOSIS — F339 Major depressive disorder, recurrent, unspecified: Secondary | ICD-10-CM | POA: Diagnosis not present

## 2020-08-24 DIAGNOSIS — Z95810 Presence of automatic (implantable) cardiac defibrillator: Secondary | ICD-10-CM | POA: Diagnosis not present

## 2020-08-24 DIAGNOSIS — Z20822 Contact with and (suspected) exposure to covid-19: Secondary | ICD-10-CM | POA: Diagnosis not present

## 2020-08-24 DIAGNOSIS — G4733 Obstructive sleep apnea (adult) (pediatric): Secondary | ICD-10-CM | POA: Diagnosis present

## 2020-08-24 DIAGNOSIS — M5416 Radiculopathy, lumbar region: Secondary | ICD-10-CM | POA: Diagnosis not present

## 2020-08-24 DIAGNOSIS — W010XXA Fall on same level from slipping, tripping and stumbling without subsequent striking against object, initial encounter: Secondary | ICD-10-CM | POA: Diagnosis present

## 2020-08-24 DIAGNOSIS — M4856XA Collapsed vertebra, not elsewhere classified, lumbar region, initial encounter for fracture: Secondary | ICD-10-CM | POA: Diagnosis not present

## 2020-08-24 DIAGNOSIS — K5900 Constipation, unspecified: Secondary | ICD-10-CM | POA: Diagnosis present

## 2020-08-24 DIAGNOSIS — E871 Hypo-osmolality and hyponatremia: Secondary | ICD-10-CM | POA: Diagnosis not present

## 2020-08-24 DIAGNOSIS — E875 Hyperkalemia: Secondary | ICD-10-CM | POA: Diagnosis present

## 2020-08-24 DIAGNOSIS — I2510 Atherosclerotic heart disease of native coronary artery without angina pectoris: Secondary | ICD-10-CM | POA: Diagnosis present

## 2020-08-24 DIAGNOSIS — F411 Generalized anxiety disorder: Secondary | ICD-10-CM | POA: Diagnosis not present

## 2020-08-24 DIAGNOSIS — E782 Mixed hyperlipidemia: Secondary | ICD-10-CM | POA: Diagnosis present

## 2020-08-24 LAB — GLUCOSE, CAPILLARY
Glucose-Capillary: 146 mg/dL — ABNORMAL HIGH (ref 70–99)
Glucose-Capillary: 183 mg/dL — ABNORMAL HIGH (ref 70–99)
Glucose-Capillary: 201 mg/dL — ABNORMAL HIGH (ref 70–99)
Glucose-Capillary: 99 mg/dL (ref 70–99)

## 2020-08-24 MED ORDER — BISACODYL 10 MG RE SUPP
10.0000 mg | Freq: Once | RECTAL | Status: AC
  Administered 2020-08-24: 10 mg via RECTAL
  Filled 2020-08-24: qty 1

## 2020-08-24 MED ORDER — OXYCODONE HCL ER 10 MG PO T12A
10.0000 mg | EXTENDED_RELEASE_TABLET | Freq: Two times a day (BID) | ORAL | Status: DC
  Administered 2020-08-24 – 2020-08-27 (×6): 10 mg via ORAL
  Filled 2020-08-24 (×7): qty 1

## 2020-08-24 MED ORDER — LIDOCAINE 5 % EX PTCH
2.0000 | MEDICATED_PATCH | CUTANEOUS | Status: DC
  Administered 2020-08-24 – 2020-08-29 (×5): 2 via TRANSDERMAL
  Filled 2020-08-24 (×5): qty 2

## 2020-08-24 MED ORDER — OXYCODONE HCL 5 MG PO TABS
5.0000 mg | ORAL_TABLET | ORAL | Status: DC | PRN
  Administered 2020-08-25: 5 mg via ORAL
  Filled 2020-08-24: qty 1

## 2020-08-24 MED ORDER — MAGNESIUM CITRATE PO SOLN
1.0000 | Freq: Once | ORAL | Status: AC
  Administered 2020-08-24: 1 via ORAL
  Filled 2020-08-24: qty 296

## 2020-08-24 NOTE — Consult Note (Addendum)
Palliative Medicine Inpatient Consult Note  Reason for consult: "Intractable low back pain and radiculopathy that has been refractory to surgical intervention and current pain managment. She has multiple comorbidities and there is no surgical intervention to offer her to help alleviate her symptoms."  HPI:  Per intake H&P --> Past medical history of CAD, chronic HFrEF, AICD implant, HTN, HLD, hiatal hernia, hypothyroidism, type II DM, OSA on CPAP, B12 deficiency presents with worsening chronic lower back pain.Currently plan is pain control, per neurosurgery no intervention.  Palliative care has been asked to get involved to aid in symptom management.  Clinical Assessment/Goals of Care:  *Please note that this is a verbal dictation therefore any spelling or grammatical errors are due to the "[employer] One" system interpretation.  I have reviewed medical records including EPIC notes, labs and imaging, received report from bedside RN, assessed the patient who is lying in bed in no acute distress.    I met with Dana Grant and her husband Dana Grant to further discuss diagnosis prognosis, GOC, EOL wishes, disposition and options.   I introduced Palliative Medicine as specialized medical care for people living with serious illness. It focuses on providing relief from the symptoms and stress of a serious illness. The goal is to improve quality of life for both the patient and the family.  Dana Grant shares with me that she lives in [place], [place].  Been married to her husband Dana Grant for the past 45 years.  She he had 2 children a son and a daughter.  Her daughter passed away in 2017-07-03.  Dana Grant has 3 grandchildren 1 of which she has custody of.  She is a former [employer] manager.  She is a woman of faith and practices [employer].  Prior to admission Dana Grant had been struggling with basic activities of daily living since July 2021.  It was in July of last year that she had a fusion surgery  which has left her with persistent intractable lower back pain.  Per Dana Grant she is now at a point where he needs to mobilize her with a wheelchair to the bathroom and hoist her on the toilet and then repeat the same process to get her back to her recliner chair.  Dana Grant also endorses that even going out with Dana Grant's trial some as she is not able to tolerate walking up any steps or inclines.  Asked Dana Grant if she would be willing to get out of bed and mobilize with me so that he may get the most accurate assessment of the pain she is enduring.  Dana Grant was in agreement with this.  I was able to get her from sitting to the standing position in bed though with standing and extension she expresses tremendous discomfort in her lower back radiating down her left lower extremity.  We took a few steps forward and pivoted to the chair with sitting in the upright recliner her pain continues and worsens in intensity.  We reviewed that the pain is sharp stabbing and unremitting.  Is able to aid Dana Grant in getting back to bed and we reviewed the plan for additional pain management.  We discussed starting OxyContin so that she does not have constant peaks and valleys to her pain control.  We discussed adding oxycodone as an adjunctive as needed.  Reviewed continuing baclofen, gabapentin, and Cymbalta.  She will have Toradol and Dilaudid as rescue medication should they be needed.    Dana Grant shares with me that she has not had a good bowel  movement in a number of weeks now.  We discussed that she is receiving Senokot twice a day and MiraLAX daily. She does not have any rescue laxatives therefore discussed ordering a suppository.   Dana Grant denies active nausea or vomiting at this time.  A detailed discussion was had today regarding advanced directives - Dana Grant shares that she has never completed advanced directives though she would be interested in doing so while hospitalized.  I provided a copy of the advance directive  forms and reviewed that with her.  Within her husband we will further review this before making any additional advance care planning decisions  I provided Dana Grant a MOST form and reviewed concepts specific to code status, artifical feeding and hydration, continued IV antibiotics and rehospitalization was had.  She would also like to review this paper with her husband prior to making any additional decisions.  Discussed the importance of continued conversation with family and their  medical providers regarding overall plan of care and treatment options, ensuring decisions are within the context of the patients values and GOCs.  Decision Maker: Dana Grant (spouse) [phone]  SUMMARY OF RECOMMENDATIONS   Full code/full scope of care  Symptom management as below -appreciate clinical pharmacy team conversations regarding appropriate dosing for patient's pain management  Ongoing discussions regarding advanced care planning once acute symptoms are under control  Code Status/Advance Care Planning: FULL CODE   Symptom Management:  Chronic lower back pain: -Continue Robaxin 500 mg 3 times daily -Continue gabapentin 600 mg twice daily -Discontinue Norco -Start oxycodone 5 mg p.o. as needed pain -Start OxyContin 10 mg p.o. twice daily -Continue Dilaudid 0.5 to 1 mg IV every 2 hours as needed breakthrough pain -Continue Toradol 50 mg IV 4 times daily for 5-day course (Started 4/21 - ) -Start planning Lidoderm patches to lower back  Constipation: -Miralax 17g PO Qday -Senokot 2 tabs p.o. twice daily -Bisacodyl $RemoveBeforeDE'10mg'kaZLjXEdwTfraLp$  PR Qday PRN  Muscular Weakness: -Physical Therapy Evaluation -Occupational Therapy Evaluation   Spiritual: - Chaplain consult  Need for emotional support: - Utilize therapeutic listening - Ensure safe space to express concerns - Sit next to patient/family making eye contact  Palliative Prophylaxis:   Oral care, mobility, delirium precautions, constipation and  pain management  Additional Recommendations (Limitations, Scope, Preferences):  Continue current scope of care  Psycho-social/Spiritual:   Desire for further Chaplaincy support:  Yes patient is a [place]  Additional Recommendations:  Education on chronic comorbid conditions   Prognosis:  Unclear prognosis at this time  Discharge Planning:  Patient will need to discharge to skilled nursing to optimize her strength moving forward  Vitals:   08/24/20 0424 08/24/20 0808  BP: 133/75 (!) 141/61  Pulse: 76 74  Resp: 16 17  Temp: 98.1 F (36.7 C) (!) 97.5 F (36.4 C)  SpO2: 97% 97%   No intake or output data in the 24 hours ending 08/24/20 1314 Last Weight  Most recent update: 08/22/2020  2:11 AM   Weight  109.3 kg (240 lb 15.4 oz)           Gen: Elderly female in no acute distress HEENT: moist mucous membranes CV: Regular rate and rhythm PULM: Room air, clear to auscultation bilaterally ABD: soft/nontender EXT: No edema Neuro: Alert and oriented x3  PPS:   This conversation/these recommendations were discussed with patient primary care team, Dr. Posey Pronto  Time In: 1150 Time Out: 1300 Total Time: 70 Greater than 50%  of this time was spent counseling and coordinating care  related to the above assessment and plan.  [place] Team Team Cell Phone: [phone] Please utilize secure chat with additional questions, if there is no response within 30 minutes please call the above phone number  Palliative Medicine Team providers are available by phone from 7am to 7pm daily and can be reached through the team cell phone.  Should this patient require assistance outside of these hours, please call the patient's attending physician.

## 2020-08-24 NOTE — Progress Notes (Signed)
EPIC Encounter for ICM Monitoring  Patient Name: Dana Grant is a 70 y.o. female Date: 08/24/2020 Primary Care Physican: Pcp, No Primary Cardiologist:[facility] Electrophysiologist:Allred Bi-V Pacing:>99% Weight: unknown   Transmission reviewed.  Patient currently hospitalized for fall.   CorVue thoracic impedancenormal but was suggesting possible fluid accumulation from 4/6-4/17.  Prescribed: Furosemide40 mg and 20 mg tabletsto equal60 mg daily. 1/5/2022Pt reports taking differently and taking Furosemide PRN instead of daily.  Labs: 08/22/2020 Creatinine 1.12, BUN 14, Potassium 4.3, Sodium 132, GFR 53 08/21/2020 Creatinine 1.04, BUN 13, Potassium 3.9, Sodium 133, GFR 57  A complete set of results can be found in Results Review.  Recommendations: No changes.  Follow-up plan: ICM clinic phone appointment on5/06/2020 to recheck fluid levels post hospitalization. 91 day device clinic remote transmission6/05/2020.   EP/Cardiology Office Visits:6/3/2022with Dr. Johney Frame.  Copy of ICM check sent to Dr.Allred..   3 month ICM trend: 08/20/2020.    1 Year ICM trend:       Karie Soda, RN 08/24/2020 8:51 AM

## 2020-08-24 NOTE — TOC Progression Note (Signed)
Transition of Care [facility]) - Progression Note    Patient Details  Name: DAISIE HAFT MRN: [id number] Date of Birth: 1951/01/10  [facility]) CM/SW Contact  Ralene Bathe, LCSWA Phone Number: 08/24/2020, 1:44 PM  Clinical Narrative:     CSW spoke with Maralyn Sago at [facility] [phone] and received a bed offer for the patient.    If patient d/c over the weekend call report to [phone] unit 2.  Then call [phone] to inform the on- call person that the patient will be coming.  Insurance auth. has been received.  Patient and spouse were notified at bedside on this day.   Expected Discharge Plan: Skilled Nursing Facility Barriers to Discharge: Continued Medical Work up,SNF Pending bed offer,Insurance Authorization  Expected Discharge Plan and Services Expected Discharge Plan: Skilled Nursing Facility     Post Acute Care Choice: Skilled Nursing Facility Living arrangements for the past 2 months: Single Family Home                                       Social Determinants of Health (SDOH) Interventions    Readmission Risk Interventions No flowsheet data found.

## 2020-08-24 NOTE — Progress Notes (Signed)
Physical Therapy Treatment Patient Details Name: Dana Grant MRN: [id number] DOB: [birth year] Today's Date: 08/24/2020    History of Present Illness Pt is 70 yo female with chronic low back pain had a fall about 4 to 5 days ago since then the pain is acutely worsened radiating to left lower extremity. Imaging revealed chronic L1 and L5 fx, L2-5 fusion.  Neurosurgery consulted and recommended pain management but no surgical intervention at this time, possible epidural injection if pain not relieved.  Pt with PMH including but not limited to L2-5 fusion 7/21, recent stomach bug with intense vomitting hospitalized 4/3-4/9, chronic back pain, DM, ADIC, CAD, CHF, HTN, MI, OSA, depressive disorder    PT Comments    Pt reports pain started improving the past 2 days with positioning and further improved today with pain medicine changes.  She demonstrated improved bed mobility and moving bil LE without pain.  Reports just laying back down - got up with pain management team per family.  At PT arrival, pt did have pain in low back and radiating around pelvis into legs.  Positioned in prone for lumbar/thoracic extension and performed soft tissue massage and myofascial release technique low L back.  Pt with decreased radiation of pain with both of these technique and even with return to supine no radiating pain.  Educated on extension positions.  Continue to progress as pain improves.    Follow Up Recommendations  SNF     Equipment Recommendations  Other (comment) (needs further assessment - hospital bed/w/c)    Recommendations for Other Services       Precautions / Restrictions Precautions Precautions: Fall Precaution Comments: Intense Pain    Mobility  Bed Mobility Overal bed mobility: Needs Assistance Bed Mobility: Rolling Rolling: Mod assist         General bed mobility comments: Rolled to prone with mod A for UE and use of bed pad.  Back to supine post therapy. See general  comments    Transfers                 General transfer comment: Pt reports was able to get OOB with pain management but just got back in bed and does not want OOB  Ambulation/Gait                 Stairs             Wheelchair Mobility    Modified Rankin (Stroke Patients Only)       Balance Overall balance assessment: History of Falls                                          Cognition Arousal/Alertness: Awake/alert Behavior During Therapy: WFL for tasks assessed/performed Overall Cognitive Status: Within Functional Limits for tasks assessed                                        Exercises      General Comments   Pt reports improving pain in past couple days but still radiates into legs.  Discussed purpose of prone positioning to promote spine extension and hopeful improvement in bulging disc. Pt willing to try.  Positioned in prone - difficulty getting into position but once there pain improving and no longer radiating into legs.  Stayed  in prone for ~15 minutes.  Also, noting tightness in L lower/mid back.  Performed soft tissue massage and myofascial release techniques.  Returned to supine.  Symptoms still controlled.  Educated on peripheralization vs centralization of pain (centralization being a good sign).  Educated on other options for thoracic/lumbac extension since prone difficult to achieve.  Other positions including gentle extension when sitting EOB with spouse hand on back to support, or in supine placing pillow down between shoulder blades or lower.  Helped pt get in comfortable position of slight R sidelying with legs flexed and supported with pillows.      Pertinent Vitals/Pain Pain Assessment: 0-10 Pain Score: 5  Pain Location: low back around L pelvis/abdomen/ down L & R legs (see general comments) Pain Descriptors / Indicators: Burning;Sharp;Spasm;Grimacing Pain Intervention(s): Limited activity within  patient's tolerance;Monitored during session;Premedicated before session;Repositioned;Relaxation;Other (comment) (myofascial release)    Home Living                      Prior Function            PT Goals (current goals can now be found in the care plan section) Acute Rehab PT Goals Patient Stated Goal: decrease pain PT Goal Formulation: With patient/family Time For Goal Achievement: 09/05/20 Potential to Achieve Goals: Fair Progress towards PT goals: Progressing toward goals    Frequency    Min 3X/week      PT Plan Current plan remains appropriate    Co-evaluation              AM-PAC PT "6 Clicks" Mobility   Outcome Measure  Help needed turning from your back to your side while in a flat bed without using bedrails?: A Lot Help needed moving from lying on your back to sitting on the side of a flat bed without using bedrails?: Total Help needed moving to and from a bed to a chair (including a wheelchair)?: Total Help needed standing up from a chair using your arms (e.g., wheelchair or bedside chair)?: Total Help needed to walk in hospital room?: Total Help needed climbing 3-5 steps with a railing? : Total 6 Click Score: 7    End of Session   Activity Tolerance: Patient limited by pain Patient left: in bed;with call bell/phone within reach;with bed alarm set;with family/visitor present (pillows to position) Nurse Communication: Mobility status PT Visit Diagnosis: Other abnormalities of gait and mobility (R26.89);History of falling (Z91.81);Pain Pain - part of body:  (back)     Time: 2725-3664 PT Time Calculation (min) (ACUTE ONLY): 32 min  Charges:  $Therapeutic Activity: 8-22 mins $Massage: 8-22 mins                     Anise Salvo, PT Acute Rehab Services Pager [phone] [facility] [phone]     Dana Grant 08/24/2020, 1:20 PM

## 2020-08-24 NOTE — Progress Notes (Signed)
[facility] Progress Note  Patient: Dana Grant    OZH:[id number]  DOA: 08/21/2020     Date of Service: the patient was seen and examined on 08/24/2020  Brief hospital course: Past medical history of CAD, chronic HFrEF, AICD implant, HTN, HLD, hiatal hernia, hypothyroidism, type II DM, OSA on CPAP, B12 deficiency presents with worsening chronic lower back pain. Currently plan is pain control, per neurosurgery no intervention.  Assessment and Plan: 1.  Intractable lower back pain. L1 and L5 chronic fracture. L2 L5 posterior fusion with foraminal stenosis. Moderate to severe bilateral foraminal stenosis T12-L1. Neurosurgery consulted appreciate assistance. Recommend no intervention right now. Neurosurgery consulted palliative care for pain management. Currently on Dilaudid, Norco, Neurontin, Robaxin, scheduled Toradol. Change Norco to Oxy IR and scheduled OxyContin. PT recommends SNF, insurance authorization received.  Once pain well controlled can be discharged to SNF.  2.  Chronic anxiety Continue Xanax home regimen.  3.  Accelerated hypertension Chronic systolic CHF Currently appears euvolemic. Continue home regimen. Blood pressure was elevated on admission.  Currently improving.  4.  Hypothyroidism Continue Synthroid.  5.  Elevated troponin Demand ischemia in the setting of hypertension and pain. No chest pain or shortness of breath or anginal equivalent symptoms. Monitor. EKG also negative for any acute ischemic changes.  6.  Obesity OSA CPAP nightly.  7.  Constipation. Continue bowel regimen. Currently on MiraLAX and Senokot.  Add mag citrate.  Body mass index is 38.89 kg/m.   Diet: Cardiac diet DVT Prophylaxis:   enoxaparin (LOVENOX) injection 40 mg Start: 08/22/20 0800   Advance goals of care discussion: Full code  Family Communication: family was present at bedside, at the time of interview.  The pt provided permission to discuss medical  plan with the family. Opportunity was given to ask question and all questions were answered satisfactorily.   Disposition:  Status is: Inpatient  Remains inpatient appropriate because:Ongoing active pain requiring inpatient pain management  Dispo:  Patient From: Home  Planned Disposition: Skilled Nursing Facility  Medically stable for discharge: No     Subjective: Continues to report severe pain when moving.  Recommended to stay ahead of the pain.  Reports constipation but no nausea or vomiting.  Oral intake minimal.  Physical Exam: General: Appear in moderate distress, no Rash; Oral Mucosa Clear, moist. no Abnormal Neck Mass Or lumps, Conjunctiva normal  Cardiovascular: S1 and S2 Present, no Murmur, Respiratory: good respiratory effort, Bilateral Air entry present and CTA, no Crackles, no wheezes Abdomen: Bowel Sound present, Soft and no tenderness Extremities: no Pedal edema Neurology: alert and oriented to time, place, and person affect appropriate. no new focal deficit Gait not checked due to patient safety concerns    Vitals:   08/23/20 2100 08/24/20 0424 08/24/20 0808 08/24/20 1526  BP: 133/63 133/75 (!) 141/61 (!) 116/53  Pulse: 93 76 74 80  Resp: 16 16 17 16   Temp: 98.9 F (37.2 C) 98.1 F (36.7 C) (!) 97.5 F (36.4 C) 98.1 F (36.7 C)  TempSrc: Oral  Oral Oral  SpO2: 97% 97% 97% 94%  Weight:      Height:       No intake or output data in the 24 hours ending 08/24/20 1909 Filed Weights   08/22/20 0040  Weight: 109.3 kg    Data Reviewed: I have personally reviewed and interpreted daily labs, tele strips, imaging. I reviewed all nursing notes, pharmacy notes, vitals, pertinent old records I have discussed plan of care as described above  with RN and patient/family.  CBC: Recent Labs  Lab 08/21/20 1749 08/22/20 0008  WBC 10.6* 10.1  NEUTROABS 6.2  --   HGB 11.6* 11.8*  HCT 34.5* 35.6*  MCV 98.9 98.6  PLT 291 292   Basic Metabolic Panel: Recent Labs   Lab 08/21/20 1749 08/22/20 0008  NA 133* 132*  K 3.9 4.3  CL 99 99  CO2 25 27  GLUCOSE 162* 252*  BUN 13 14  CREATININE 1.04* 1.12*  CALCIUM 8.6* 8.7*    Studies: No results found.  Scheduled Meds: . carvedilol  6.25 mg Oral BID  . DULoxetine  30 mg Oral BID  . enoxaparin (LOVENOX) injection  40 mg Subcutaneous Q24H  . gabapentin  600 mg Oral BID  . insulin aspart  0-9 Units Subcutaneous TID WC  . insulin aspart protamine- aspart  30 Units Subcutaneous BID WC  . ketorolac  15 mg Intravenous QID  . levothyroxine  75 mcg Oral QAC breakfast  . lidocaine  2 patch Transdermal Q24H  . magnesium citrate  1 Bottle Oral Once  . methocarbamol  500 mg Oral TID  . oxyCODONE  10 mg Oral Q12H  . pantoprazole  40 mg Oral Q1200  . polyethylene glycol  17 g Oral Daily  . senna-docusate  2 tablet Oral BID   Continuous Infusions: PRN Meds: acetaminophen **OR** acetaminophen, ALPRAZolam, HYDROmorphone (DILAUDID) injection, ondansetron (ZOFRAN) IV, oxyCODONE  Time spent: 35 minutes  Author: Lynden Oxford, MD [facility] 08/24/2020 7:09 PM  To reach On-call, see care teams to locate the attending and reach out via [email or web address]. Between 7PM-7AM, please contact night-coverage If you still have difficulty reaching the attending provider, please page the [facility] (Director on Call) for [facility] on amion for assistance.

## 2020-08-24 NOTE — Progress Notes (Signed)
Subjective: Patient reports that she had a good day yesterday and was able to increase the mobility in her BLE. However, she does report that she was in a significant amount of pain last night and was unable to sleep well. No acute events overnight.   Objective: Vital signs in last 24 hours: Temp:  [98.1 F (36.7 C)-98.9 F (37.2 C)] 98.1 F (36.7 C) (04/22 0424) Pulse Rate:  [76-93] 76 (04/22 0424) Resp:  [16] 16 (04/22 0424) BP: (133-134)/(63-75) 133/75 (04/22 0424) SpO2:  [95 %-97 %] 97 % (04/22 0424)  Intake/Output from previous day: No intake/output data recorded. Intake/Output this shift: No intake/output data recorded.  Physical Exam: Patient is awake, A/O X 4,andconversant. She is in a moderate amount of distress due to severe lumbar pain. She does MAEW and has intact sensation, although movement causes her a significant amount of pain. DTR WNL.Speech is fluent and appropriate. CNs grossly intact. PERRLA   Lab Results: Recent Labs    08/21/20 1749 08/22/20 0008  WBC 10.6* 10.1  HGB 11.6* 11.8*  HCT 34.5* 35.6*  PLT 291 292   BMET Recent Labs    08/21/20 1749 08/22/20 0008  NA 133* 132*  K 3.9 4.3  CL 99 99  CO2 25 27  GLUCOSE 162* 252*  BUN 13 14  CREATININE 1.04* 1.12*  CALCIUM 8.6* 8.7*    Studies/Results: No results found.  Assessment/Plan: Awaiting palliative care consult and input. Would appreciate pain management from palliative care. If pain management is unsuccessful with medications, I would recommend trying an L5/S1 interlaminar epidural steroid injection. Continue supportive care, aggresive pain control, and encouraging mobility. Continue PT/OT. Will need SNF placement once appropriate.      Council Mechanic, DNP, NP-C 08/24/2020, 7:27 AM

## 2020-08-24 NOTE — Plan of Care (Signed)

## 2020-08-25 DIAGNOSIS — Z515 Encounter for palliative care: Secondary | ICD-10-CM

## 2020-08-25 LAB — BASIC METABOLIC PANEL
Anion gap: 10 (ref 5–15)
BUN: 20 mg/dL (ref 8–23)
CO2: 26 mmol/L (ref 22–32)
Calcium: 8.9 mg/dL (ref 8.9–10.3)
Chloride: 99 mmol/L (ref 98–111)
Creatinine, Ser: 1.04 mg/dL — ABNORMAL HIGH (ref 0.44–1.00)
GFR, Estimated: 57 mL/min — ABNORMAL LOW (ref >60)
Glucose, Bld: 178 mg/dL — ABNORMAL HIGH (ref 70–99)
Potassium: 4.5 mmol/L (ref 3.5–5.1)
Sodium: 135 mmol/L (ref 135–145)

## 2020-08-25 LAB — GLUCOSE, CAPILLARY
Glucose-Capillary: 172 mg/dL — ABNORMAL HIGH (ref 70–99)
Glucose-Capillary: 192 mg/dL — ABNORMAL HIGH (ref 70–99)
Glucose-Capillary: 219 mg/dL — ABNORMAL HIGH (ref 70–99)
Glucose-Capillary: 101 mg/dL — ABNORMAL HIGH (ref 70–99)

## 2020-08-25 LAB — CBC
HCT: 35.1 % — ABNORMAL LOW (ref 36.0–46.0)
Hemoglobin: 11.9 g/dL — ABNORMAL LOW (ref 12.0–15.0)
MCH: 33 pg (ref 26.0–34.0)
MCHC: 33.9 g/dL (ref 30.0–36.0)
MCV: 97.2 fL (ref 80.0–100.0)
Platelets: 280 10*3/uL (ref 150–400)
RBC: 3.61 MIL/uL — ABNORMAL LOW (ref 3.87–5.11)
RDW: 13.9 % (ref 11.5–15.5)
WBC: 8.7 10*3/uL (ref 4.0–10.5)
nRBC: 0 % (ref 0.0–0.2)

## 2020-08-25 MED ORDER — BISACODYL 10 MG RE SUPP: 10.0000 mg | Freq: Once | RECTAL | Status: DC

## 2020-08-25 MED ORDER — OXYCODONE HCL 5 MG PO TABS
5.0000 mg | ORAL_TABLET | ORAL | Status: DC | PRN
  Administered 2020-08-27 – 2020-08-28 (×3): 5 mg via ORAL
  Filled 2020-08-25 (×3): qty 1

## 2020-08-25 MED ORDER — FLEET ENEMA 7-19 GM/118ML RE ENEM
1.0000 | ENEMA | Freq: Once | RECTAL | Status: AC
  Administered 2020-08-25: 1 via RECTAL
  Filled 2020-08-25: qty 1

## 2020-08-25 MED ORDER — SORBITOL 70 % SOLN
960.0000 mL | TOPICAL_OIL | Freq: Once | ORAL | Status: DC
  Filled 2020-08-25: qty 473

## 2020-08-25 MED ORDER — HYDROMORPHONE HCL 1 MG/ML IJ SOLN
0.5000 mg | INTRAMUSCULAR | Status: DC | PRN
  Administered 2020-08-26: 1 mg via INTRAVENOUS
  Filled 2020-08-25: qty 1

## 2020-08-25 MED ORDER — OXYCODONE HCL 5 MG PO TABS
10.0000 mg | ORAL_TABLET | ORAL | Status: DC | PRN
Start: 2020-08-25 — End: 2020-08-29
  Administered 2020-08-27 – 2020-08-29 (×3): 10 mg via ORAL
  Filled 2020-08-25 (×3): qty 2

## 2020-08-25 MED ORDER — CYCLOBENZAPRINE HCL 10 MG PO TABS
10.0000 mg | ORAL_TABLET | Freq: Three times a day (TID) | ORAL | Status: DC
  Administered 2020-08-25 – 2020-08-26 (×4): 10 mg via ORAL
  Filled 2020-08-25 (×4): qty 1

## 2020-08-25 MED ORDER — BISACODYL 10 MG RE SUPP: 10.0000 mg | Freq: Every day | RECTAL | Status: DC | PRN

## 2020-08-25 MED ORDER — LACTULOSE 10 GM/15ML PO SOLN
20.0000 g | Freq: Three times a day (TID) | ORAL | Status: DC
  Administered 2020-08-25 (×2): 20 g via ORAL
  Filled 2020-08-25 (×2): qty 30

## 2020-08-25 NOTE — Progress Notes (Signed)
RN said he would put the CPAP on pt when she is ready.

## 2020-08-25 NOTE — Progress Notes (Signed)
[facility] Progress Note  Patient: Dana Grant    SWF:[id number]  DOA: 08/21/2020     Date of Service: the patient was seen and examined on 08/25/2020  Brief hospital course: Past medical history of CAD, chronic HFrEF, AICD implant, HTN, HLD, hiatal hernia, hypothyroidism, type II DM, OSA on CPAP, B12 deficiency presents with worsening chronic lower back pain. Currently plan is pain control, per neurosurgery no intervention.  Assessment and Plan: 1.  Intractable lower back pain. L1 and L5 chronic fracture. L2 L5 posterior fusion with foraminal stenosis. Moderate to severe bilateral foraminal stenosis T12-L1. Neurosurgery consulted appreciate assistance. Recommend no intervention right now. Neurosurgery consulted palliative care for pain management. Currently on Dilaudid, Norco, Neurontin, scheduled Toradol. Change Norco to Oxy IR and scheduled OxyContin. Change Robaxin given patient's ongoing spasmodic pain. PT recommends SNF, insurance authorization received. Once pain well controlled can be discharged to SNF. Outpatient follow-up with neurosurgery with plan for ESI.  2.  Chronic anxiety Continue Xanax home regimen.  3.  Accelerated hypertension Chronic systolic CHF Currently appears euvolemic. Continue home regimen. Blood pressure was elevated on admission.  Currently improving.  4.  Hypothyroidism Continue Synthroid.  5.  Elevated troponin Demand ischemia in the setting of hypertension and pain. No chest pain or shortness of breath or anginal equivalent symptoms. Monitor. EKG also negative for any acute ischemic changes.  6.  Obesity OSA CPAP nightly.  7.  Constipation. Continue bowel regimen. Currently on MiraLAX and Senokot.  No resolution with mag citrate. Will attempt Fleet enema.  Body mass index is 38.89 kg/m.   Diet: Cardiac diet DVT Prophylaxis:   enoxaparin (LOVENOX) injection 40 mg Start: 08/22/20 0800   Advance goals of care  discussion: Full code  Family Communication: No family was present at bedside, at the time of interview.  Discussed with husband at bedside on 4/22.  Disposition:  Status is: Inpatient  Remains inpatient appropriate because:Ongoing active pain requiring inpatient pain management  Dispo:  Patient From: Home  Planned Disposition: Skilled Nursing Facility  Medically stable for discharge: No     Subjective: Continues to have pain.  No nausea no vomiting.  Still constipated.  No BM.  Have lower abdominal pain as well as bilateral upper thigh pain.  Spasmodic in nature.  Physical Exam: General: Appear in moderate distress, no Rash; Oral Mucosa Clear, moist. no Abnormal Neck Mass Or lumps, Conjunctiva normal  Cardiovascular: S1 and S2 Present, no Murmur, Respiratory: good respiratory effort, Bilateral Air entry present and CTA, no Crackles, no wheezes Abdomen: Bowel Sound present, Soft and no tenderness Extremities: no Pedal edema Neurology: alert and oriented to time, place, and person affect appropriate. no new focal deficit Gait not checked due to patient safety concerns   Vitals:   08/24/20 2059 08/24/20 2235 08/25/20 0600 08/25/20 0813  BP: 139/60  131/61 137/65  Pulse: 90 89 89 87  Resp: 18 17 18 17   Temp: 98 F (36.7 C)  98 F (36.7 C) 98.1 F (36.7 C)  TempSrc: Oral  Oral Oral  SpO2: 92% 97% 97% 95%  Weight:      Height:       No intake or output data in the 24 hours ending 08/25/20 1338 Filed Weights   08/22/20 0040  Weight: 109.3 kg    Data Reviewed: I have personally reviewed and interpreted daily labs, tele strips, imaging. I reviewed all nursing notes, pharmacy notes, vitals, pertinent old records I have discussed plan of care as described above  with RN and patient/family.  CBC: Recent Labs  Lab 08/21/20 1749 08/22/20 0008 08/25/20 0818  WBC 10.6* 10.1 8.7  NEUTROABS 6.2  --   --   HGB 11.6* 11.8* 11.9*  HCT 34.5* 35.6* 35.1*  MCV 98.9 98.6 97.2   PLT 291 292 280   Basic Metabolic Panel: Recent Labs  Lab 08/21/20 1749 08/22/20 0008 08/25/20 0818  NA 133* 132* 135  K 3.9 4.3 4.5  CL 99 99 99  CO2 25 27 26   GLUCOSE 162* 252* 178*  BUN 13 14 20   CREATININE 1.04* 1.12* 1.04*  CALCIUM 8.6* 8.7* 8.9    Studies: No results found.  Scheduled Meds: . carvedilol  6.25 mg Oral BID  . cyclobenzaprine  10 mg Oral TID  . DULoxetine  30 mg Oral BID  . enoxaparin (LOVENOX) injection  40 mg Subcutaneous Q24H  . gabapentin  600 mg Oral BID  . insulin aspart  0-9 Units Subcutaneous TID WC  . insulin aspart protamine- aspart  30 Units Subcutaneous BID WC  . ketorolac  15 mg Intravenous QID  . lactulose  20 g Oral TID  . levothyroxine  75 mcg Oral QAC breakfast  . lidocaine  2 patch Transdermal Q24H  . oxyCODONE  10 mg Oral Q12H  . pantoprazole  40 mg Oral Q1200  . polyethylene glycol  17 g Oral Daily  . senna-docusate  2 tablet Oral BID   Continuous Infusions: PRN Meds: acetaminophen **OR** acetaminophen, ALPRAZolam, bisacodyl, HYDROmorphone (DILAUDID) injection, ondansetron (ZOFRAN) IV, oxyCODONE, oxyCODONE  Time spent: 35 minutes  Author: , MD [facility] 08/25/2020 1:38 PM  To reach On-call, see care teams to locate the attending and reach out via [email or web address]. Between 7PM-7AM, please contact night-coverage If you still have difficulty reaching the attending provider, please page the [facility] (Director on Call) for [facility] on amion for assistance.

## 2020-08-25 NOTE — Progress Notes (Addendum)
Palliative Medicine Inpatient Follow Up Note  Reason for consult:  "Intractable low back pain and radiculopathy that has been refractory to surgical intervention and current pain managment. She has multiple comorbidities and there is no surgical intervention to offer her to help alleviate her symptoms."  HPI:  Per intake H&P --> Past medical history of CAD, chronic HFrEF, AICD implant, HTN, HLD, hiatal hernia, hypothyroidism, type II DM, OSA on CPAP, B12 deficiency presents with worsening chronic lower back pain.Currently plan is pain control,per neurosurgery no intervention.  Palliative care has been asked to get involved to aid in symptom management.  Today's Discussion (08/25/2020):  *Please note that this is a verbal dictation therefore any spelling or grammatical errors are due to the "[place] [employer]" system interpretation.  Chart reviewed. Patients Robaxin has been DC'd by the primary team and patient has been initiated on Flexeril.  Patient has received 3 doses of OxyContin 10 mg, 1 mg IV push of as needed Dilaudid, and 5 mg of as needed oxycodone.  To date patient has still not had a bowel movement though she will be receiving an enema today.  I met with Rosibel this morning. We reviewed her current pain regiment. She expresses some improvements with pain though that it is still present. We reviewed the plan for long acting and short acting pain control.   Was able to touch base with patients RN to provide additional pain medications.   Questions and concerns addressed   Objective Assessment: Vital Signs Vitals:   08/25/20 0600 08/25/20 0813  BP: 131/61 137/65  Pulse: 89 87  Resp: 18 17  Temp: 98 F (36.7 C) 98.1 F (36.7 C)  SpO2: 97% 95%   No intake or output data in the 24 hours ending 08/25/20 1120 Last Weight  Most recent update: 08/22/2020  2:11 AM   Weight  109.3 kg (240 lb 15.4 oz)           Gen: Elderly female in no acute distress HEENT: moist  mucous membranes CV: Regular rate and rhythm PULM: Room air, clear to auscultation bilaterally ABD: Distended EXT: No edema Neuro: Alert and oriented x3  SUMMARY OF RECOMMENDATIONS Full code/full scope of care  Symptom management as below -appreciate clinical pharmacy team conversations regarding appropriate dosing for patient's pain management  Ongoing discussions regarding advanced care planning once acute symptoms are under control  Code Status/Advance Care Planning: FULL CODE  Symptom Management: Chronic lower back pain: -Continue Flexeril 500 mg 3 times daily -Continue gabapentin 600 mg twice daily -Discontinue Norco -Oxycodone 5 mg p.o. as needed for moderate pain -Oxycodone 15m p.o. as needed for severe pain -Start OxyContin 10 mg p.o. twice daily -Continue Dilaudid 0.5 to 1 mg IV every 2 hours as needed breakthrough pain -Continue Toradol 50 mg IV 4 times daily for 5-day course (Started 4/21 - ) -Start planning Lidoderm patches to lower back  Constipation: -Miralax 17g PO Qday -Senokot 2 tabs p.o. twice daily -Bisacodyl 114mPR Qday PRN -Enema today (4/23)  Muscular Weakness: -Physical Therapy Evaluation -Occupational Therapy Evaluation  Spiritual: - Chaplain consult  Need for emotional support: - Utilize therapeutic listening - Ensure safe space to express concerns - Sit next to patient/family making eye contact  Time Spent: 25 Greater than 50% of the time was spent in counseling and coordination of care ______________________________________________________________________________________ [facility] Team Cell Phone: [phone] utilize secure chat with additional questions, if there is no response within 30  minutes please call the above phone number  Palliative Medicine Team providers are available by phone from 7am to 7pm daily and can be reached through the team cell phone.  Should this  patient require assistance outside of these hours, please call the patient's attending physician.

## 2020-08-25 NOTE — Progress Notes (Signed)
Pt cont to c/o severe pain, has lost IV access this am. Reports some improvement in back/leg pain yesterday when in chair. Appreciate palliative care assistance. Once acute pain is better controlled, can consider lumbar ESI likely as outpatient.

## 2020-08-26 LAB — GLUCOSE, CAPILLARY
Glucose-Capillary: 167 mg/dL — ABNORMAL HIGH (ref 70–99)
Glucose-Capillary: 255 mg/dL — ABNORMAL HIGH (ref 70–99)
Glucose-Capillary: 163 mg/dL — ABNORMAL HIGH (ref 70–99)
Glucose-Capillary: 178 mg/dL — ABNORMAL HIGH (ref 70–99)

## 2020-08-26 MED ORDER — SENNOSIDES-DOCUSATE SODIUM 8.6-50 MG PO TABS: 2.0000 | ORAL_TABLET | Freq: Two times a day (BID) | ORAL | Status: DC | PRN

## 2020-08-26 MED ORDER — CYCLOBENZAPRINE HCL 5 MG PO TABS
5.0000 mg | ORAL_TABLET | Freq: Three times a day (TID) | ORAL | Status: DC
  Administered 2020-08-26 – 2020-08-28 (×6): 5 mg via ORAL
  Filled 2020-08-26 (×6): qty 1

## 2020-08-26 NOTE — Progress Notes (Signed)
   08/26/20 1519  Assess: MEWS Score  Temp 97.7 F (36.5 C)  BP 110/64  Pulse Rate 84  Resp 18  Level of Consciousness Alert  SpO2 97 %  O2 Device Room Air  Assess: MEWS Score  MEWS Temp 0  MEWS Systolic 0  MEWS Pulse 0  MEWS RR 0  MEWS LOC 0  MEWS Score 0  MEWS Score Color Green  Assess: if the MEWS score is Yellow or Red  Were vital signs taken at a resting state? Yes  Focused Assessment No change from prior assessment  Early Detection of Sepsis Score *See Row Information* Low  Treat  MEWS Interventions Administered scheduled meds/treatments  Pain Scale Faces  Pain Score 0  Patients response to intervention Effective  Take Vital Signs  Increase Vital Sign Frequency  Yellow: Q 2hr X 2 then Q 4hr X 2, if remains yellow, continue Q 4hrs  Escalate  MEWS: Escalate Yellow: discuss with charge nurse/RN and consider discussing with provider and RRT  Notify: Charge Nurse/RN  Name of Charge Nurse/RN Notified Paije Goodhart, rn  Date Charge Nurse/RN Notified 08/26/20  Time Charge Nurse/RN Notified 1450  Document  Patient Outcome Stabilized after interventions  Progress note created (see row info) Yes

## 2020-08-26 NOTE — Progress Notes (Addendum)
Palliative Medicine Inpatient Follow Up Note  Reason for consult:  "Intractable low back pain and radiculopathy that has been refractory to surgical intervention and current pain managment. She has multiple comorbidities and there is no surgical intervention to offer her to help alleviate her symptoms."  HPI:  Per intake H&P --> Past medical history of CAD, chronic HFrEF, AICD implant, HTN, HLD, hiatal hernia, hypothyroidism, type II DM, OSA on CPAP, B12 deficiency presents with worsening chronic lower back pain.Currently plan is pain control,per neurosurgery no intervention.  Palliative care has been asked to get involved to aid in symptom management.  Today's Discussion (08/26/2020):  *Please note that this is a verbal dictation therefore any spelling or grammatical errors are due to the "[employer]" system interpretation.  Chart reviewed. Patients Robaxin has been DC'd by the primary team and patient has been initiated on Flexeril.  Patient has received on dose of PRN dilaudid and one dose of PRN oxycodone 88m. (+) large BM in later afternoon yesterday.   I spoke to patients bedside RN, Shirlyne this morning. We reviewed the plan for pain control.   I met with MShakeyathis morning. She shares with me that her pain is "a little better". I asked her if she has been out of bed which she denies. I encouraged her to start mobilizing though to verify she is given some pain medications in advance. MAmbriexpresses relief in the setting of having had a BM. We reviewed the importance of maintaining a good bowl regiment.  Questions and concerns addressed   Objective Assessment: Vital Signs Vitals:   08/26/20 0610 08/26/20 1207  BP: 128/65 (!) 93/54  Pulse: 78 79  Resp: 16 18  Temp: 97.8 F (36.6 C) (!) 97.5 F (36.4 C)  SpO2: 94% 98%    Intake/Output Summary (Last 24 hours) at 08/26/2020 1351 Last data filed at 08/25/2020 2300 Gross per 24 hour  Intake --  Output 350 ml   Net -350 ml   Last Weight  Most recent update: 08/22/2020  2:11 AM   Weight  109.3 kg (240 lb 15.4 oz)           Gen: Elderly female in no acute distress HEENT: moist mucous membranes CV: Regular rate and rhythm PULM: Room air, clear to auscultation bilaterally ABD: Distended EXT: No edema Neuro: Alert and oriented x3  SUMMARY OF RECOMMENDATIONS Full code/full scope of care  Symptom management as below  Will request chaplain meet with patient to complete advance directives  Ongoing PMT support  Code Status/Advance Care Planning: FULL CODE  Symptom Management: Chronic lower back pain: -Continue Flexeril 500 mg 3 times daily -Continue gabapentin 600 mg twice daily -Discontinue Norco -Oxycodone 5 mg p.o. as needed for moderate pain -Oxycodone 184mp.o. as needed for severe pain -Start OxyContin 10 mg p.o. twice daily -Continue Dilaudid 0.5 to 1 mg IV every 2 hours as needed breakthrough pain -Continue Toradol 50 mg IV 4 times daily for 5-day course (Started 4/21 - ) -Lidoderm patches to lower back  Anxiety: - Agree with Xanax 28m68mO TID PRN  Constipation: -Miralax 17g PO Qday -Senokot 2 tabs p.o. twice daily -Bisacodyl 110m5m Qday PRN  Muscular Weakness: -Physical Therapy Evaluation -Occupational Therapy Evaluation  Spiritual: - Chaplain consult  Need for emotional support: - Utilize therapeutic listening - Ensure safe space to express concerns - Sit next to patient/family making eye contact  Time Spent: 25 Greater than 50% of the time was spent in  counseling and coordination of care ______________________________________________________________________________________ [facility] Team Team Cell Phone: [phone] Please utilize secure chat with additional questions, if there is no response within 30 minutes please call the above phone number  Palliative Medicine Team providers are available by phone  from 7am to 7pm daily and can be reached through the team cell phone.  Should this patient require assistance outside of these hours, please call the patient's attending physician.

## 2020-08-26 NOTE — Progress Notes (Signed)
   Providing Compassionate, Quality Care - Together  NEUROSURGERY PROGRESS NOTE   S: No issues overnight.  Pain appears more controlled today  O: EXAM:  BP 128/65 (BP Location: Right Arm)   Pulse 78   Temp 97.8 F (36.6 C) (Oral)   Resp 16   Ht 5\' 6"  (1.676 m)   Wt 109.3 kg   SpO2 94%   BMI 38.89 kg/m   Awake, alert, oriented  Speech fluent, appropriate  CNs grossly intact  Moving all extremities   ASSESSMENT:  70 y.o. female with   1.  L1, L5 fracture status post L2-5 fusion  PLAN: -Pain more controlled, palliative care on board -Likely ESI as an outpatient if needed -rehab pending   Thank you for allowing me to participate in this patient's care.  Please do not hesitate to call with questions or concerns.   61, DO Neurosurgeon [facility]

## 2020-08-26 NOTE — Progress Notes (Signed)
   08/26/20 1430  Assess: MEWS Score  Temp 97.7 F (36.5 C)  BP (!) 107/51  Pulse Rate 79  Resp 17  SpO2 93 %  O2 Device Room Air  Assess: MEWS Score  MEWS Temp 0  MEWS Systolic 0  MEWS Pulse 0  MEWS RR 0  MEWS LOC 2  MEWS Score 2  MEWS Score Color Yellow  Assess: if the MEWS score is Yellow or Red  Were vital signs taken at a resting state? Yes  Focused Assessment No change from prior assessment  Early Detection of Sepsis Score *See Row Information* Low  MEWS guidelines implemented *See Row Information* No, vital signs rechecked  Treat  Pain Scale Faces  Pain Score 0  Pain Onset On-going  Patients Stated Pain Goal 0  Pain Intervention(s) Medication (See eMAR);Repositioned  Multiple Pain Sites No  Breathing 0  Negative Vocalization 0  Facial Expression 0  Body Language 0  Consolability 0  PAINAD Score 0

## 2020-08-26 NOTE — Progress Notes (Signed)
[facility] Progress Note  Patient: Dana Grant    UUV:[id number]  DOA: 08/21/2020     Date of Service: the patient was seen and examined on 08/26/2020  Brief hospital course: Past medical history of CAD, chronic HFrEF, AICD implant, HTN, HLD, hiatal hernia, hypothyroidism, type II DM, OSA on CPAP, B12 deficiency presents with worsening chronic lower back pain. Currently plan is pain control, per neurosurgery no intervention.  Assessment and Plan: 1.  Intractable lower back pain. L1 and L5 chronic fracture. L2 L5 posterior fusion with foraminal stenosis. Moderate to severe bilateral foraminal stenosis T12-L1. Neurosurgery consulted appreciate assistance. Recommend no intervention right now. Neurosurgery consulted palliative care for pain management. Currently on Dilaudid, Norco, Neurontin, scheduled Toradol. Palliative care changed Norco to Oxy IR and scheduled OxyContin. Change Robaxin given patient's ongoing spasmodic pain. PT recommends SNF, insurance authorization received. Once pain well controlled can be discharged to SNF. Outpatient follow-up with neurosurgery with plan for ESI. Currently sleepy therefore reducing the dose of the Flexeril from 10mg  to 5 mg. May require transition to either only nightly OxyContin or reducing the dose of gabapentin.  2.  Chronic anxiety Continue Xanax home regimen.  3.  Accelerated hypertension Chronic systolic CHF Currently appears euvolemic. Continue home regimen. Blood pressure was elevated on admission.  Currently improving.  4.  Hypothyroidism Continue Synthroid.  5.  Elevated troponin Demand ischemia in the setting of hypertension and pain. No chest pain or shortness of breath or anginal equivalent symptoms. Monitor. EKG also negative for any acute ischemic changes.  6.  Obesity OSA CPAP nightly.  7.  Constipation.  Resolved Continue bowel regimen. Currently on MiraLAX and Senokot.  Enema not  attempted.  Body mass index is 38.89 kg/m.   Diet: Cardiac diet DVT Prophylaxis:   enoxaparin (LOVENOX) injection 40 mg Start: 08/22/20 0800   Advance goals of care discussion: Full code  Family Communication: family was present at bedside, at the time of interview.   Disposition:  Status is: Inpatient  Remains inpatient appropriate because:Ongoing active pain requiring inpatient pain management  Dispo:  Patient From: Home  Planned Disposition: Skilled Nursing Facility  Medically stable for discharge: No     Subjective: Pain is well controlled.  No nausea no vomiting.  Had 5 bowel movements overnight.  No blood in the stool.  Sleepy and appears to be under influence.  Physical Exam: General: Appear in mild distress, no Rash; Oral Mucosa Clear, moist. no Abnormal Neck Mass Or lumps, Conjunctiva normal  Cardiovascular: S1 and S2 Present, no Murmur, Respiratory: good respiratory effort, Bilateral Air entry present and CTA, no Crackles, no wheezes Abdomen: Bowel Sound present, Soft and no tenderness Extremities: no Pedal edema Neurology: alert and oriented to time, place, and person affect appropriate. no new focal deficit, no asterixis. Gait not checked due to patient safety concerns   Vitals:   08/26/20 1500 08/26/20 1519 08/26/20 1559 08/26/20 1602  BP:  110/64 110/64 130/89  Pulse: 84 84 82 87  Resp: 18 18 18 18   Temp:  97.7 F (36.5 C) 97.7 F (36.5 C) 97.9 F (36.6 C)  TempSrc:    Oral  SpO2: 96% 97% 100% 100%  Weight:      Height:        Intake/Output Summary (Last 24 hours) at 08/26/2020 1852 Last data filed at 08/25/2020 2300 Gross per 24 hour  Intake --  Output 350 ml  Net -350 ml   Filed Weights   08/22/20 0040  Weight:  109.3 kg    Data Reviewed: I have personally reviewed and interpreted daily labs, tele strips, imaging. I reviewed all nursing notes, pharmacy notes, vitals, pertinent old records I have discussed plan of care as described above  with RN and patient/family.  CBC: Recent Labs  Lab 08/21/20 1749 08/22/20 0008 08/25/20 0818  WBC 10.6* 10.1 8.7  NEUTROABS 6.2  --   --   HGB 11.6* 11.8* 11.9*  HCT 34.5* 35.6* 35.1*  MCV 98.9 98.6 97.2  PLT 291 292 280   Basic Metabolic Panel: Recent Labs  Lab 08/21/20 1749 08/22/20 0008 08/25/20 0818  NA 133* 132* 135  K 3.9 4.3 4.5  CL 99 99 99  CO2 25 27 26   GLUCOSE 162* 252* 178*  BUN 13 14 20   CREATININE 1.04* 1.12* 1.04*  CALCIUM 8.6* 8.7* 8.9    Studies: No results found.  Scheduled Meds: . carvedilol  6.25 mg Oral BID  . cyclobenzaprine  5 mg Oral TID  . DULoxetine  30 mg Oral BID  . enoxaparin (LOVENOX) injection  40 mg Subcutaneous Q24H  . gabapentin  600 mg Oral BID  . insulin aspart  0-9 Units Subcutaneous TID WC  . insulin aspart protamine- aspart  30 Units Subcutaneous BID WC  . ketorolac  15 mg Intravenous QID  . levothyroxine  75 mcg Oral QAC breakfast  . lidocaine  2 patch Transdermal Q24H  . oxyCODONE  10 mg Oral Q12H  . pantoprazole  40 mg Oral Q1200  . polyethylene glycol  17 g Oral Daily  . senna-docusate  2 tablet Oral BID   Continuous Infusions: PRN Meds: acetaminophen **OR** acetaminophen, ALPRAZolam, bisacodyl, HYDROmorphone (DILAUDID) injection, ondansetron (ZOFRAN) IV, oxyCODONE, oxyCODONE  Time spent: 35 minutes  Author: , MD [facility] 08/26/2020 6:52 PM  To reach On-call, see care teams to locate the attending and reach out via [email or web address]. Between 7PM-7AM, please contact night-coverage If you still have difficulty reaching the attending provider, please page the [facility] (Director on Call) for [facility] on amion for assistance.

## 2020-08-27 DIAGNOSIS — Z7189 Other specified counseling: Secondary | ICD-10-CM

## 2020-08-27 LAB — GLUCOSE, CAPILLARY
Glucose-Capillary: 124 mg/dL — ABNORMAL HIGH (ref 70–99)
Glucose-Capillary: 229 mg/dL — ABNORMAL HIGH (ref 70–99)
Glucose-Capillary: 351 mg/dL — ABNORMAL HIGH (ref 70–99)
Glucose-Capillary: 234 mg/dL — ABNORMAL HIGH (ref 70–99)

## 2020-08-27 NOTE — Progress Notes (Signed)
[facility] Progress Note  Patient: Dana Grant    CHE:[id number]  DOA: 08/21/2020     Date of Service: the patient was seen and examined on 08/27/2020  Brief hospital course: Past medical history of CAD, chronic HFrEF, AICD implant, HTN, HLD, hiatal hernia, hypothyroidism, type II DM, OSA on CPAP, B12 deficiency presents with worsening chronic lower back pain. Currently plan is pain control, per neurosurgery no intervention.  Assessment and Plan: 1.  Intractable lower back pain. L1 and L5 chronic fracture. L2 L5 posterior fusion with foraminal stenosis. Moderate to severe bilateral foraminal stenosis T12-L1. Neurosurgery consulted appreciate assistance. Recommend no intervention right now. Neurosurgery consulted palliative care for pain management. Currently on Dilaudid, Norco, Neurontin, scheduled Toradol. Palliative care changed Norco to Oxy IR and scheduled OxyContin. Change Robaxin given patient's ongoing spasmodic pain. PT recommends SNF, insurance authorization received. Once pain well controlled can be discharged to SNF. Outpatient follow-up with neurosurgery with plan for ESI. Remains sleepy. Reduce the dose of the Flexeril. Discontinue OxyContin. Continue as needed pain medication. He remains sleepy further we will reduce the dose of gabapentin as well.  2.  Chronic anxiety Continue Xanax home regimen.  3.  Accelerated hypertension Chronic systolic CHF Currently appears euvolemic. Continue home regimen. Blood pressure was elevated on admission.  Currently improving.  4.  Hypothyroidism Continue Synthroid.  5.  Elevated troponin Demand ischemia in the setting of hypertension and pain. No chest pain or shortness of breath or anginal equivalent symptoms. Monitor. EKG also negative for any acute ischemic changes.  6.  Obesity OSA CPAP nightly.  7.  Constipation.  Resolved Continue bowel regimen. Currently on MiraLAX and Senokot.  Body mass  index is 38.89 kg/m.   Diet: Cardiac diet DVT Prophylaxis:   enoxaparin (LOVENOX) injection 40 mg Start: 08/22/20 0800   Advance goals of care discussion: Full code  Family Communication: family was present at bedside, at the time of interview.   Disposition:  Status is: Inpatient  Remains inpatient appropriate because:Ongoing active pain requiring inpatient pain management  Dispo:  Patient From: Home  Planned Disposition: Skilled Nursing Facility  Medically stable for discharge: No     Subjective: Seen after receiving pain medication.  Sleepy and drowsy.  No nausea no vomiting.  Husband has some concern regarding medication regimen.  Minimal oral intake yesterday due to sleepiness.  Physical Exam: General: Appear in mild distress, no Rash; Oral Mucosa Clear, moist. no Abnormal Neck Mass Or lumps, Conjunctiva normal  Cardiovascular: S1 and S2 Present, no Murmur, Respiratory: good respiratory effort, Bilateral Air entry present and CTA, no Crackles, no wheezes Abdomen: Bowel Sound present, Soft and no tenderness Extremities: no Pedal edema Neurology: Sleepy and oriented to time, place, and person affect appropriate. no new focal deficit Gait not checked due to patient safety concerns   Vitals:   08/27/20 0647 08/27/20 0818 08/27/20 1611 08/27/20 2036  BP: (!) 147/66 132/67 (!) 159/66 (!) 151/76  Pulse: 79 84 100 100  Resp: 20 16 17 18   Temp: 98.4 F (36.9 C) 97.8 F (36.6 C) 98.7 F (37.1 C) 98.3 F (36.8 C)  TempSrc: Oral Oral Oral Oral  SpO2: 99% 97% 96% 96%  Weight:      Height:        Intake/Output Summary (Last 24 hours) at 08/27/2020 2101 Last data filed at 08/27/2020 1300 Gross per 24 hour  Intake 600 ml  Output --  Net 600 ml   Filed Weights   08/22/20 0040  Weight: 109.3 kg    Data Reviewed: I have personally reviewed and interpreted daily labs, tele strips, imaging. I reviewed all nursing notes, pharmacy notes, vitals, pertinent old records I  have discussed plan of care as described above with RN and patient/family.  CBC: Recent Labs  Lab 08/21/20 1749 08/22/20 0008 08/25/20 0818  WBC 10.6* 10.1 8.7  NEUTROABS 6.2  --   --   HGB 11.6* 11.8* 11.9*  HCT 34.5* 35.6* 35.1*  MCV 98.9 98.6 97.2  PLT 291 292 280   Basic Metabolic Panel: Recent Labs  Lab 08/21/20 1749 08/22/20 0008 08/25/20 0818  NA 133* 132* 135  K 3.9 4.3 4.5  CL 99 99 99  CO2 25 27 26   GLUCOSE 162* 252* 178*  BUN 13 14 20   CREATININE 1.04* 1.12* 1.04*  CALCIUM 8.6* 8.7* 8.9    Studies: No results found.  Scheduled Meds: . carvedilol  6.25 mg Oral BID  . cyclobenzaprine  5 mg Oral TID  . DULoxetine  30 mg Oral BID  . enoxaparin (LOVENOX) injection  40 mg Subcutaneous Q24H  . gabapentin  600 mg Oral BID  . insulin aspart  0-9 Units Subcutaneous TID WC  . insulin aspart protamine- aspart  30 Units Subcutaneous BID WC  . ketorolac  15 mg Intravenous QID  . levothyroxine  75 mcg Oral QAC breakfast  . lidocaine  2 patch Transdermal Q24H  . pantoprazole  40 mg Oral Q1200  . polyethylene glycol  17 g Oral Daily   Continuous Infusions: PRN Meds: acetaminophen **OR** acetaminophen, ALPRAZolam, bisacodyl, ondansetron (ZOFRAN) IV, oxyCODONE, oxyCODONE, senna-docusate  Time spent: 35 minutes  Author: , MD [facility] 08/27/2020 9:01 PM  To reach On-call, see care teams to locate the attending and reach out via [email or web address]. Between 7PM-7AM, please contact night-coverage If you still have difficulty reaching the attending provider, please page the [facility] (Director on Call) for [facility] on amion for assistance.

## 2020-08-27 NOTE — TOC Progression Note (Signed)
Transition of Care [facility]) - Progression Note    Patient Details  Name: AMATULLAH CHRISTY MRN: [id number] Date of Birth: [birth year]  Transition of [facility]) CM/SW Contact  Ralene Bathe, LCSWA Phone Number: 08/27/2020, 1:31 PM  Clinical Narrative:    CSW received a call from [place] inquiring about the patient's arrival date.  CSW informed the SNF that patient is currently not medically ready for d/c.  SNF reports still having a bed available for patient.     Expected Discharge Plan: Skilled Nursing Facility Barriers to Discharge: Continued Medical Work up,SNF Pending bed offer,Insurance Authorization  Expected Discharge Plan and Services Expected Discharge Plan: Skilled Nursing Facility     Post Acute Care Choice: Skilled Nursing Facility Living arrangements for the past 2 months: Single Family Home                                       Social Determinants of Health (SDOH) Interventions    Readmission Risk Interventions No flowsheet data found.

## 2020-08-27 NOTE — Progress Notes (Signed)
Occupational Therapy Treatment Patient Details Name: Dana Grant MRN: [id number] DOB: May 30, 1950 Today's Date: 08/27/2020    History of present illness Pt is 70 yo female with chronic low back pain had a fall about 4 to 5 days ago since then the pain is acutely worsened radiating to left lower extremity. Imaging revealed chronic L1 and L5 fx, L2-5 fusion.  Neurosurgery consulted and recommended pain management but no surgical intervention at this time, possible epidural injection if pain not relieved.  Pt with PMH including but not limited to L2-5 fusion 7/21, recent stomach bug with intense vomitting hospitalized 4/3-4/9, chronic back pain, DM, ADIC, CAD, CHF, HTN, MI, OSA, depressive disorder   OT comments  Pt with managed pain and increased motivation for bed mobility and OOB transfers this session. Pt completed bed mobility initially with min guard and verbal cues for positioning, however required min A for BLE management to transfer sit>supine. Pt completed sit<>stand transfer with min A for physical boosting with use of bed rail and recliner chair placed in front. Pt tolerated standing position for ~30 seconds with min A for balance. Pt continues to benefit from acute OT services for pain management and progression in ADLs and mobility. D/c plan remains appropriate.   Follow Up Recommendations  SNF    Equipment Recommendations  3 in 1 bedside commode       Precautions / Restrictions Precautions Precautions: Fall Precaution Comments: Intense Pain Restrictions Weight Bearing Restrictions: No       Mobility Bed Mobility Overal bed mobility: Needs Assistance Bed Mobility: Rolling;Supine to Sit;Sit to Supine Rolling: Supervision   Supine to sit: Min guard Sit to supine: Min assist   General bed mobility comments: Pt requried min a for BLE management to get back into bed after standing trial    Transfers Overall transfer level: Needs assistance Equipment used:  (recliner  chair, hospital bed) Transfers: Sit to/from Stand Sit to Stand: Min assist;From elevated surface         General transfer comment: Pt completed sit<>stand from elevated surface, using bed rail to push up from and recliner chair locked in front of her; given min A for powering up and balance in standing. Pt benefited from education and verbal cues for breathing techniques    Balance Overall balance assessment: History of Falls             ADL either performed or assessed with clinical judgement   ADL Overall ADL's : Needs assistance/impaired Eating/Feeding: Bed level;Independent   Grooming: Set up;Bed level        Toilet Transfer: Maximal assistance;+2 for physical assistance;BSC;Stand-pivot   Toileting- Clothing Manipulation and Hygiene: Maximal assistance;+2 for physical assistance;Sit to/from stand       Functional mobility during ADLs: Maximal assistance;+2 for physical assistance General ADL Comments: pt progressign towards ADLs and pain management               Cognition Arousal/Alertness: Awake/alert Behavior During Therapy: WFL for tasks assessed/performed Overall Cognitive Status: Within Functional Limits for tasks assessed                   General Comments Pt with increased motivation to get OOB this session, reporting that she feels very weak from being in bed    Pertinent Vitals/ Pain       Pain Assessment: Faces Faces Pain Scale: Hurts whole lot Pain Location: low back around L pelvis/abdomen/ down L & R legs Pain Descriptors / Indicators: Burning;Sharp;Spasm;Grimacing Pain Intervention(s):  Limited activity within patient's tolerance;Monitored during session         Frequency  Min 2X/week        Progress Toward Goals  OT Goals(current goals can now be found in the care plan section)  Progress towards OT goals: Progressing toward goals  Acute Rehab OT Goals Patient Stated Goal: decrease pain OT Goal Formulation: With  patient Time For Goal Achievement: 09/06/20 Potential to Achieve Goals: Fair ADL Goals Pt Will Perform Lower Body Bathing: with min assist;with adaptive equipment;sit to/from stand Pt Will Perform Upper Body Dressing: with set-up;sitting Pt Will Transfer to Toilet: with min assist;stand pivot transfer;bedside commode Additional ADL Goal #1: Pt will independently utilize relaxation techniques for pain management  Plan Discharge plan remains appropriate       AM-PAC OT "6 Clicks" Daily Activity     Outcome Measure   Help from another person eating meals?: None Help from another person taking care of personal grooming?: A Little Help from another person toileting, which includes using toliet, bedpan, or urinal?: A Lot Help from another person bathing (including washing, rinsing, drying)?: A Lot Help from another person to put on and taking off regular upper body clothing?: A Lot Help from another person to put on and taking off regular lower body clothing?: A Lot 6 Click Score: 15    End of Session Equipment Utilized During Treatment: Other (comment) (Recliner chair)  OT Visit Diagnosis: History of falling (Z91.81);Muscle weakness (generalized) (M62.81);Pain Pain - Right/Left:  (back) Pain - part of body:  (back)   Activity Tolerance Patient limited by pain   Patient Left in bed;with call bell/phone within reach;with bed alarm set   Nurse Communication Mobility status        Time: 1552 [phone] OT Time Calculation (min): 14 min  Charges: OT General Charges $OT Visit: 1 Visit OT Treatments $Therapeutic Exercise: 8-22 mins    Rut [place] A Alexie Lanni 08/27/2020, 4:24 PM

## 2020-08-27 NOTE — Progress Notes (Signed)
This chaplain responded to PMT consult for creating or updating an Advance Directive.  The chaplain introduced herself and accepted the opportunity to reflectively listen and offer a bedside presence.    The chaplain understands the Pt. husband is the Pt. surrogate decision maker and is the person the Pt. trusts for healthcare decision making.  The chaplain will revisit an AD conversation when the Pt. is more comfortable.    The Pt. was at times tearful with pain as we talked about her faith, family, and dog.  The Pt. shares her dog is critically ill and her husband is at home taking care of the [place] Bull Dog.  The Pt. relies on her faith for healing and strength.   The Pt. accepted the chaplain's invitation for prayer and F/U spiritual care. The chaplain updated the Pt. RN-Zahra.

## 2020-08-27 NOTE — Progress Notes (Signed)
Physical Therapy Treatment Patient Details Name: Dana Grant MRN: [id number] DOB: January 27, 1951 Today's Date: 08/27/2020    History of Present Illness Pt is 70 yo female with chronic low back pain had a fall about 4 to 5 days ago since then the pain is acutely worsened radiating to left lower extremity. Imaging revealed chronic L1 and L5 fx, L2-5 fusion.  Neurosurgery consulted and recommended pain management but no surgical intervention at this time, possible epidural injection if pain not relieved.  Pt with PMH including but not limited to L2-5 fusion 7/21, recent stomach bug with intense vomitting hospitalized 4/3-4/9, chronic back pain, DM, ADIC, CAD, CHF, HTN, MI, OSA, depressive disorder    PT Comments    Pt was seen for mobility on RW on side of bed to stand and to sidestep.  Pt had episode of urine incontinence when up, and was not making use of purwick in bed.  Pt was able to balance with cues, and sidesteps for only 5' distance with sliding steps.  Follow up with her to reinforce body mechanics and sequencing of bed mob and transfers for independent carryover, following goals of acute PT.   Follow Up Recommendations  SNF     Equipment Recommendations  Other (comment)    Recommendations for Other Services       Precautions / Restrictions Precautions Precautions: Fall Precaution Comments: Intense Pain Restrictions Weight Bearing Restrictions: No    Mobility  Bed Mobility Overal bed mobility: Needs Assistance Bed Mobility: Rolling;Sidelying to Sit;Sit to Supine Rolling: Supervision   Supine to sit: Mod assist Sit to supine: Mod assist        Transfers Overall transfer level: Needs assistance Equipment used: Rolling walker (2 wheeled) Transfers: Sit to/from Stand Sit to Stand: Mod assist         General transfer comment: pt was able to stand with walker and take sidesteps  Ambulation/Gait Ambulation/Gait assistance: Min assist Gait Distance (Feet): 5  Feet Assistive device: Rolling walker (2 wheeled);1 person hand held assist Gait Pattern/deviations: Step-to pattern;Decreased stride length;Wide base of support Gait velocity: reduced Gait velocity interpretation: <1.31 ft/sec, indicative of household ambulator General Gait Details: pt was seen for standing balance and sidestepping   Stairs             Wheelchair Mobility    Modified Rankin (Stroke Patients Only)       Balance Overall balance assessment: History of Falls                                          Cognition Arousal/Alertness: Awake/alert Behavior During Therapy: WFL for tasks assessed/performed Overall Cognitive Status: Within Functional Limits for tasks assessed                                        Exercises      General Comments General comments (skin integrity, edema, etc.): has not been OOB this weekend per pt      Pertinent Vitals/Pain Pain Assessment: Faces Faces Pain Scale: Hurts whole lot Pain Location: low back and B thighs and hips Pain Descriptors / Indicators: Grimacing;Guarding Pain Intervention(s): Limited activity within patient's tolerance;Monitored during session;Premedicated before session;Repositioned    Home Living  Prior Function            PT Goals (current goals can now be found in the care plan section) Acute Rehab PT Goals Patient Stated Goal: decrease pain Progress towards PT goals: Progressing toward goals    Frequency    Min 3X/week      PT Plan Current plan remains appropriate    Co-evaluation              AM-PAC PT "6 Clicks" Mobility   Outcome Measure  Help needed turning from your back to your side while in a flat bed without using bedrails?: A Lot Help needed moving from lying on your back to sitting on the side of a flat bed without using bedrails?: A Lot Help needed moving to and from a bed to a chair (including a  wheelchair)?: A Lot Help needed standing up from a chair using your arms (e.g., wheelchair or bedside chair)?: A Lot Help needed to walk in hospital room?: A Lot Help needed climbing 3-5 steps with a railing? : Total 6 Click Score: 11    End of Session Equipment Utilized During Treatment: Gait belt Activity Tolerance: Patient limited by pain;Patient limited by fatigue Patient left: in bed;with call bell/phone within reach;with bed alarm set;with family/visitor present Nurse Communication: Mobility status PT Visit Diagnosis: Other abnormalities of gait and mobility (R26.89);History of falling (Z91.81);Pain Pain - part of body: Hip (back)     Time: 7209-4709 PT Time Calculation (min) (ACUTE ONLY): 24 min  Charges:  $Therapeutic Activity: 23-37 mins          Ivar Drape 08/27/2020, 9:24 PM  Samul Dada, PT MS Acute Rehab Dept. Number: [facility] [id number] and [facility] [phone]

## 2020-08-27 NOTE — Progress Notes (Signed)
    Progress Note from the Palliative Medicine Team at [facility]   Patient Name: Dana Grant       Date: 08/27/2020 DOB: Jul 13, 1950  Age: 70 y.o. MRN#: [id number] Attending Physician: Rolly Salter, MD Primary Care Physician: Pcp, No Admit Date: 08/21/2020   Medical records reviewed    This NP visited patient at the bedside as a follow up for palliative medcine needs and emotional support.  70 YO female with past medical history of CAD, chronic HFrEF, AICD implant, HTN, HLD, hiatal hernia, hypothyroidism, type II DM, OSA on CPAP, B12 deficiency presents with worsening chronic lower back pain. Currently plan is pain control, per neurosurgery no intervention.  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 Patient reports no discomfort at thtis time.  Husband reports that she "was out in space yesterday"   Created space and opportunity for patient and her husband to explore their thoughts and feelings regarding current medical situation.  Although patient is lethargic she is able to participate in today's conversation.  Ultimately hope is that patient's pain would be "better controlled".  She hopes to discharge to a skilled nursing facility for rehab once stable from this hospitalization.  I shared my concern with patient and husband regarding the importance of a secure treatment plan as it relates to long-term pain management..  Husband tells me that currently patient does not have a primary care doctor or a pain management doctor in the [place] area.  He tells me that he has been trying for the past several months but has been unsuccessful.  She hopes to  secure a physician who will work with her and manage her pain issue into the future.  Husband verbalizes that they are more than willing to come to [facility] for that care.  They live in [place].  I spoke with Dr. Jake Samples DO/neurosurgery regarding this case.  For today we will make adjustments to current medications treating her chronic pain - DC IV Dilaudid -Continue oxycodone as needed -Continue Xanax as needed - f/u in the morning for further adjustments related to chronic pain.  Education offered on the importance of advanced care planning  Questions and concerns addressed   Discussed with Dr Allena Katz  Total time spent on the unit was 75 minutes  Greater than 50% of the time was spent in counseling and coordination of care  Lorinda Creed NP  Palliative Medicine Team Team Phone # [phone] Pager [phone]

## 2020-08-27 NOTE — Progress Notes (Signed)
RT note. PT. Placed on CPAP for the night, RN aware

## 2020-08-28 DIAGNOSIS — F419 Anxiety disorder, unspecified: Secondary | ICD-10-CM

## 2020-08-28 LAB — BASIC METABOLIC PANEL
Anion gap: 7 (ref 5–15)
BUN: 25 mg/dL — ABNORMAL HIGH (ref 8–23)
CO2: 32 mmol/L (ref 22–32)
Calcium: 9.2 mg/dL (ref 8.9–10.3)
Chloride: 96 mmol/L — ABNORMAL LOW (ref 98–111)
Creatinine, Ser: 1.43 mg/dL — ABNORMAL HIGH (ref 0.44–1.00)
GFR, Estimated: 39 mL/min — ABNORMAL LOW (ref >60)
Glucose, Bld: 197 mg/dL — ABNORMAL HIGH (ref 70–99)
Potassium: 5.4 mmol/L — ABNORMAL HIGH (ref 3.5–5.1)
Sodium: 135 mmol/L (ref 135–145)

## 2020-08-28 LAB — GLUCOSE, CAPILLARY
Glucose-Capillary: 187 mg/dL — ABNORMAL HIGH (ref 70–99)
Glucose-Capillary: 214 mg/dL — ABNORMAL HIGH (ref 70–99)
Glucose-Capillary: 311 mg/dL — ABNORMAL HIGH (ref 70–99)
Glucose-Capillary: 232 mg/dL — ABNORMAL HIGH (ref 70–99)

## 2020-08-28 LAB — CREATININE, SERUM
Creatinine, Ser: 1.51 mg/dL — ABNORMAL HIGH (ref 0.44–1.00)
GFR, Estimated: 37 mL/min — ABNORMAL LOW (ref >60)

## 2020-08-28 MED ORDER — SODIUM ZIRCONIUM CYCLOSILICATE 10 G PO PACK
10.0000 g | PACK | Freq: Once | ORAL | Status: AC
  Administered 2020-08-28: 10 g via ORAL
  Filled 2020-08-28: qty 1

## 2020-08-28 MED ORDER — ACETAMINOPHEN 500 MG PO TABS
1000.0000 mg | ORAL_TABLET | Freq: Three times a day (TID) | ORAL | Status: DC
  Administered 2020-08-28 – 2020-08-29 (×4): 1000 mg via ORAL
  Filled 2020-08-28 (×4): qty 2

## 2020-08-28 MED ORDER — SENNOSIDES-DOCUSATE SODIUM 8.6-50 MG PO TABS
2.0000 | ORAL_TABLET | Freq: Every day | ORAL | Status: DC
  Administered 2020-08-28: 2 via ORAL
  Filled 2020-08-28: qty 2

## 2020-08-28 MED ORDER — IBUPROFEN 200 MG PO TABS
400.0000 mg | ORAL_TABLET | Freq: Three times a day (TID) | ORAL | Status: DC
  Administered 2020-08-28 – 2020-08-29 (×4): 400 mg via ORAL
  Filled 2020-08-28 (×4): qty 2

## 2020-08-28 MED ORDER — CYCLOBENZAPRINE HCL 10 MG PO TABS
10.0000 mg | ORAL_TABLET | Freq: Three times a day (TID) | ORAL | Status: DC
  Administered 2020-08-28 – 2020-08-29 (×4): 10 mg via ORAL
  Filled 2020-08-28 (×4): qty 1

## 2020-08-28 NOTE — Plan of Care (Signed)
  Problem: Education: Goal: Knowledge of General Education information will improve Description: Including pain rating scale, medication(s)/side effects and non-pharmacologic comfort measures Outcome: Progressing   Problem: Health Behavior/Discharge Planning: Goal: Ability to manage health-related needs will improve Outcome: Progressing   Problem: Nutrition: Goal: Adequate nutrition will be maintained Outcome: Progressing   Problem: Activity: Goal: Risk for activity intolerance will decrease Outcome: Not Progressing   Problem: Coping: Goal: Level of anxiety will decrease Outcome: Not Progressing   Problem: Pain Managment: Goal: General experience of comfort will improve Outcome: Not Progressing   Problem: Safety: Goal: Ability to remain free from injury will improve Outcome: Not Progressing

## 2020-08-28 NOTE — Progress Notes (Signed)
Inpatient Diabetes Program Recommendations  AACE/ADA: New Consensus Statement on Inpatient Glycemic Control (2015)  Target Ranges:  Prepandial:   less than 140 mg/dL      Peak postprandial:   less than 180 mg/dL (1-2 hours)      Critically ill patients:  140 - 180 mg/dL   Results for DHALIA, ZINGARO (MRN [id number]) as of 08/28/2020 10:33  Ref. Range 08/27/2020 06:42 08/27/2020 11:24 08/27/2020 17:16 08/27/2020 21:18  Glucose-Capillary Latest Ref Range: 70 - 99 mg/dL 549 (H)  1 unit NOVOLOG  30 units 70/30 Insulin @9am   229 (H)  3 units NOVOLOG  351 (H)  7 units NOVOLOG  30 units 70/30 Insulin @5 :49pm  234 (H)   Results for SADIYAH, KANGAS (MRN ) as of 08/28/2020 10:33  Ref. Range 08/28/2020 08:16  Glucose-Capillary Latest Ref Range: 70 - 99 mg/dL 08/30/2020 (H)  2 units NOVOLOG  30 units 70/30 Insulin    Admit with: Intractable lower back pain L1 and L5 chronic fracture  History: DM, CHF  Home DM Meds: 70/30 Insulin 60 units BID  Current Orders: 70/30 Insulin 30 units BID with meals      Novolog Sensitive Correction Scale/ SSI (0-9 units) TID AC    MD- Note CBGs higher in the afternoon  Please consider increasing the AM dose of the 70/30 Insulin to 40 units QAM with breakfast     --Will follow patient during hospitalization--  08/30/2020 RN, MSN, CDE Diabetes Coordinator Inpatient Glycemic Control Team Team Pager: [phone] (8a-5p)

## 2020-08-28 NOTE — Progress Notes (Signed)
Physical Therapy Treatment Patient Details Name: Dana Grant MRN: [id number] DOB: April 27, 1951 Today's Date: 08/28/2020    History of Present Illness Pt is 70 yo female with chronic low back pain had a fall about 4 to 5 days ago since then the pain is acutely worsened radiating to left lower extremity. Imaging revealed chronic L1 and L5 fx, L2-5 fusion.  Neurosurgery consulted and recommended pain management but no surgical intervention at this time, possible epidural injection if pain not relieved.  Pt with PMH including but not limited to L2-5 fusion 7/21, recent stomach bug with intense vomitting hospitalized 4/3-4/9, chronic back pain, DM, ADIC, CAD, CHF, HTN, MI, OSA, depressive disorder    PT Comments    Pt was seen for mobility attempt but unable to get up to side of bed due to severe R hip pain.  Pt is noted to be tearful at times, but did talk with her about limiting exercise to tolerance.  Pt is not able to get OOB, meds were administered to help with her inability to stand up today.  Follow acutely for goals of PT.   Follow Up Recommendations  SNF     Equipment Recommendations  Other (comment)    Recommendations for Other Services Rehab consult     Precautions / Restrictions Precautions Precautions: Fall Precaution Comments: Intense Pain Required Braces or Orthoses: Spinal Brace Spinal Brace: Lumbar corset;Applied in sitting position Restrictions Weight Bearing Restrictions: No Other Position/Activity Restrictions: back brace    Mobility  Bed Mobility Overal bed mobility: Needs Assistance Bed Mobility: Rolling Rolling: Min assist         General bed mobility comments: pt cannot move RLE wthout severe pain    Transfers                 General transfer comment: deferred  Ambulation/Gait                 Stairs             Wheelchair Mobility    Modified Rankin (Stroke Patients Only)       Balance Overall balance assessment:  History of Falls                                          Cognition Arousal/Alertness: Awake/alert Behavior During Therapy: WFL for tasks assessed/performed Overall Cognitive Status: Within Functional Limits for tasks assessed                                 General Comments: pt is pushing herself today but crying      Exercises General Exercises - Lower Extremity Ankle Circles/Pumps: AAROM;5 reps Quad Sets: AAROM;10 reps Gluteal Sets: AAROM;10 reps Heel Slides: AAROM;10 reps Hip ABduction/ADduction: AAROM;10 reps    General Comments General comments (skin integrity, edema, etc.): has permission to get OOB and wear brace per MD      Pertinent Vitals/Pain Pain Assessment: Faces Faces Pain Scale: Hurts worst Pain Location: low back and B thighs and hips Pain Descriptors / Indicators: Grimacing;Guarding Pain Intervention(s): Limited activity within patient's tolerance;Repositioned    Home Living                      Prior Function            PT Goals (current  goals can now be found in the care plan section) Acute Rehab PT Goals Patient Stated Goal: decrease pain PT Goal Formulation: With patient Progress towards PT goals: Not progressing toward goals - comment    Frequency    Min 3X/week      PT Plan Current plan remains appropriate    Co-evaluation              AM-PAC PT "6 Clicks" Mobility   Outcome Measure  Help needed turning from your back to your side while in a flat bed without using bedrails?: A Lot Help needed moving from lying on your back to sitting on the side of a flat bed without using bedrails?: A Lot Help needed moving to and from a bed to a chair (including a wheelchair)?: A Lot Help needed standing up from a chair using your arms (e.g., wheelchair or bedside chair)?: A Lot Help needed to walk in hospital room?: A Lot Help needed climbing 3-5 steps with a railing? : Total 6 Click Score: 11     End of Session   Activity Tolerance: Patient limited by pain;Patient limited by fatigue Patient left: in bed;with call bell/phone within reach;with bed alarm set;with family/visitor present Nurse Communication: Mobility status PT Visit Diagnosis: Other abnormalities of gait and mobility (R26.89);History of falling (Z91.81);Pain Pain - Right/Left: Right Pain - part of body: Hip     Time: 5400-8676 PT Time Calculation (min) (ACUTE ONLY): 16 min  Charges:  $Therapeutic Exercise: 8-22 mins                Ivar Drape 08/28/2020, 9:01 PM Samul Dada, PT MS Acute Rehab Dept. Number: [facility] [id number] and [facility] [phone]

## 2020-08-28 NOTE — TOC Progression Note (Signed)
Transition of Care [facility]) - Progression Note    Patient Details  Name: Dana Grant MRN: [id number] Date of Birth: 09-May-1950  Transition of Care [facility] CM/SW Contact  Ralene Bathe, LCSWA Phone Number: 08/28/2020, 2:52 PM  Clinical Narrative:    CSW spoke with Maralyn Sago, admissions coordinator with [facility]. [phone]  CSW informed the facility that the patient is not medically ready for d/c and that her insurance authorization runs out today.    Sarah informed CSW that she will submit another insurance auth when patient is closer to being medically cleared for d/c.   Expected Discharge Plan: Skilled Nursing Facility Barriers to Discharge: Continued Medical Work up,SNF Pending bed offer,Insurance Authorization  Expected Discharge Plan and Services Expected Discharge Plan: Skilled Nursing Facility     Post Acute Care Choice: Skilled Nursing Facility Living arrangements for the past 2 months: Single Family Home                                       Social Determinants of Health (SDOH) Interventions    Readmission Risk Interventions No flowsheet data found.

## 2020-08-28 NOTE — Progress Notes (Signed)
[facility] Progress Note  Patient: Dana Grant    YWV:[id number]  DOA: 08/21/2020     Date of Service: the patient was seen and examined on 08/28/2020  Brief hospital course: Past medical history of CAD, chronic HFrEF, AICD implant, HTN, HLD, hiatal hernia, hypothyroidism, type II DM, OSA on CPAP, B12 deficiency presents with worsening chronic lower back pain. Currently plan is pain control, arrange for SNF discharge once pain is well controlled without any mentation issues.  Assessment and Plan: 1.  Intractable lower back pain. L1 and L5 chronic fracture. L2 L5 posterior fusion with foraminal stenosis. Moderate to severe bilateral foraminal stenosis T12-L1. Neurosurgery consulted appreciate assistance. Recommend no intervention right now. Neurosurgery consulted palliative care for pain management. Initially was on Dilaudid.  Later on was placed on scheduled OxyContin.  Due to excessive sleepiness both medications are discontinued. Was on Robaxin.  Due to ineffectiveness currently on Flexeril. Also on as needed oxycodone. Brace as needed. Was on IV Toradol now switched to Advil. Outpatient intraspinal injection recommended.  Recommend follow-up with neurosurgery. Highly appreciate palliative care and assist in this patient's pain as well as arranging long-term care plan for her pain control. Patient does not have a PCP currently but does have insurance. PT recommends SNF, insurance authorization expired.  Depending on how pain is controlled on 4/27 request authorization again. Once pain well controlled can be discharged to SNF. Outpatient follow-up with neurosurgery with plan for ESI.  2.  Chronic anxiety Continue Xanax home regimen.  3.  Accelerated hypertension Chronic systolic CHF Currently appears euvolemic. Continue home regimen. Blood pressure was elevated on admission.  Currently improving.  4.  Hypothyroidism Continue Synthroid.  5.  Elevated  troponin Demand ischemia in the setting of hypertension and pain. No chest pain or shortness of breath or anginal equivalent symptoms. Monitor. EKG also negative for any acute ischemic changes.  6.  Obesity OSA CPAP nightly. Body mass index is 38.89 kg/m.   7.  Constipation.  Resolved Continue bowel regimen. Currently on MiraLAX and Senokot.  8.  AKI.  Mild hyperkalemia Baseline serum creatinine around 1.  serum creatinine at the 1.5.  Secondary to poor p.o. intake in the setting of polypharmacy. Currently improving. Potassium 5.4.  Will provide 1 dose of Lokelma. Hyponatremia resolved.  Diet: Cardiac diet DVT Prophylaxis:   enoxaparin (LOVENOX) injection 40 mg Start: 08/22/20 0800   Advance goals of care discussion: Full code  Family Communication: no family was present at bedside, at the time of interview.   Disposition:  Status is: Inpatient  Remains inpatient appropriate because:Ongoing active pain requiring inpatient pain management  Dispo:  Patient From: Home  Planned Disposition: Skilled Nursing Facility  Medically stable for discharge: No     Subjective: Continues to report severe pain 9 out of 10. No nausea no vomiting.  More alert today.  Last BM was on 4/23.   Physical Exam: General: Appear in moderate distress, no Rash; Oral Mucosa Clear, moist. no Abnormal Neck Mass Or lumps, Conjunctiva normal  Cardiovascular: S1 and S2 Present, no Murmur, Respiratory: good respiratory effort, Bilateral Air entry present and CTA, no Crackles, no wheezes Abdomen: Bowel Sound present, Soft and no tenderness Extremities: no Pedal edema Neurology: alert and oriented to time, place, and person affect appropriate. no new focal deficit Gait not checked due to patient safety concerns     Vitals:   08/27/20 1611 08/27/20 2036 08/28/20 0747 08/28/20 1338  BP: (!) 159/66 (!) 151/76 (!) 138/59 Marland Kitchen)  183/88  Pulse: 100 100 80 91  Resp: 17 18 17 17   Temp: 98.7 F (37.1 C)  98.3 F (36.8 C) 97.8 F (36.6 C) 97.9 F (36.6 C)  TempSrc: Oral Oral Oral Oral  SpO2: 96% 96% 97% 98%  Weight:      Height:        Intake/Output Summary (Last 24 hours) at 08/28/2020 1812 Last data filed at 08/28/2020 1602 Gross per 24 hour  Intake --  Output 1400 ml  Net -1400 ml   Filed Weights   08/22/20 0040  Weight: 109.3 kg    Data Reviewed: I have personally reviewed and interpreted daily labs, tele strips, imaging. I reviewed all nursing notes, pharmacy notes, vitals, pertinent old records I have discussed plan of care as described above with RN and patient/family.  CBC: Recent Labs  Lab 08/22/20 0008 08/25/20 0818  WBC 10.1 8.7  HGB 11.8* 11.9*  HCT 35.6* 35.1*  MCV 98.6 97.2  PLT 292 280   Basic Metabolic Panel: Recent Labs  Lab 08/22/20 0008 08/25/20 0818 08/28/20 0424  NA 132* 135 135  K 4.3 4.5 5.4*  CL 99 99 96*  CO2 27 26 32  GLUCOSE 252* 178* 197*  BUN 14 20 25*  CREATININE 1.12* 1.04* 1.43*  1.51*  CALCIUM 8.7* 8.9 9.2    Studies: No results found.  Scheduled Meds: . acetaminophen  1,000 mg Oral Q8H  . carvedilol  6.25 mg Oral BID  . cyclobenzaprine  10 mg Oral TID  . DULoxetine  30 mg Oral BID  . enoxaparin (LOVENOX) injection  40 mg Subcutaneous Q24H  . gabapentin  600 mg Oral BID  . ibuprofen  400 mg Oral TID  . insulin aspart  0-9 Units Subcutaneous TID WC  . insulin aspart protamine- aspart  30 Units Subcutaneous BID WC  . levothyroxine  75 mcg Oral QAC breakfast  . lidocaine  2 patch Transdermal Q24H  . pantoprazole  40 mg Oral Q1200  . polyethylene glycol  17 g Oral Daily  . senna-docusate  2 tablet Oral QHS   Continuous Infusions: PRN Meds: ALPRAZolam, bisacodyl, ondansetron (ZOFRAN) IV, oxyCODONE, oxyCODONE  Time spent: 35 minutes  Author: 08/30/20, MD [facility] 08/28/2020 6:12 PM  To reach On-call, see care teams to locate the attending and reach out via [email or web address]/[email or web address]. Between 7PM-7AM, please  contact night-coverage If you still have difficulty reaching the attending provider, please page the [facility] (Director on Call) for [facility] on amion for assistance.

## 2020-08-28 NOTE — Progress Notes (Signed)
Progress Note from the Palliative Medicine Team at [facility]   Patient Name: Dana Grant       Date: 08/28/2020 DOB: 1951-03-31  Age: 70 y.o. MRN#: [id number] Attending Physician: Rolly Salter, MD Primary Care Physician: Pcp, No Admit Date: 08/21/2020   Medical records reviewed    This NP visited patient at the bedside as a follow up for palliative medcine needs and emotional support.  70 YO female with past medical history of CAD, chronic HFrEF, AICD implant, HTN, HLD, hiatal hernia, hypothyroidism, type II DM, OSA on CPAP, B12 deficiency presents with worsening chronic lower back pain. Currently plan is pain control, per neurosurgery no intervention.  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  Patient reports her pain is an 8 out of the 10 and what she feels would be helpful in the moment would be a muscle relaxant.  Nursing notified.  Continued emotional support and presents. Stayed with patient and assisted patient in deep breathing exercises.  Education offered on the mind, body, spirit connection and its impact on pain.  Patient was able to recognize the impact of distraction on her pain level.  Interaction with people and conversation is helpful.  Watching television is helpful.   Ultimately hope is that patient's pain would be "better controlled".  She hopes to discharge to a skilled nursing facility for rehab once stable from this hospitalization.  We discussed the reality that likely she would not be totally free from her chronic pain, patient verbalizes that she believes she could  tolerate and function with a pain level of 5 out of 10.    I shared my concern with patient and husband regarding the importance of a secure treatment plan as it relates to long-term pain management..  Husband tells me that currently patient does not have a primary care doctor or a pain management doctor in the [place] area.  He tells me that he has been trying for the past several months but has been unsuccessful.  She hopes to secure a physician who will work with her and manage her pain issue into the future.  Husband verbalizes that they are more than willing to come to [facility] for that care.  They live in [place].  I spoke with Dr. Jake Samples DO/neurosurgery regarding this case.  I spoke to [facility] office hoping to secure appointment with pain management specialist at this office.  Again family is willing to travel to [facility] for the support  For today we will make adjustments to current medications treating her chronic pain   -Continue oxycodone as needed -Tylenol 1000 mg p.o. every 8 hours -Ibuprofen 400 mg p.o. 3 times daily -Continue Xanax as needed -Education offered on alternative approaches to augment pain management strategy.  - f/u in the morning for further adjustments related to chronic pain.  Education offered on the importance of advanced care planning  Questions and concerns addressed   Discussed with Dr Allena Katz  Total time spent on the unit was 75 minutes  Greater than 50% of the time was spent in counseling and coordination of care  Lorinda Creed NP  Palliative Medicine Team Team Phone # [phone] Pager [phone]

## 2020-08-28 NOTE — Care Management Important Message (Signed)
Important Message  Patient Details  Name: Dana Grant MRN: [id number] Date of Birth: March 12, 1951   Medicare Important Message Given:  Yes     Oralia Rud Sriyan Cutting 08/28/2020, 2:16 PM

## 2020-08-29 DIAGNOSIS — E039 Hypothyroidism, unspecified: Secondary | ICD-10-CM

## 2020-08-29 DIAGNOSIS — I10 Essential (primary) hypertension: Secondary | ICD-10-CM

## 2020-08-29 DIAGNOSIS — F339 Major depressive disorder, recurrent, unspecified: Secondary | ICD-10-CM

## 2020-08-29 DIAGNOSIS — E782 Mixed hyperlipidemia: Secondary | ICD-10-CM

## 2020-08-29 DIAGNOSIS — R531 Weakness: Secondary | ICD-10-CM

## 2020-08-29 LAB — SARS CORONAVIRUS 2 (TAT 6-24 HRS): SARS Coronavirus 2: NEGATIVE

## 2020-08-29 LAB — CBC WITH DIFFERENTIAL/PLATELET
Abs Immature Granulocytes: 0.04 10*3/uL (ref 0.00–0.07)
Basophils Absolute: 0.1 10*3/uL (ref 0.0–0.1)
Basophils Relative: 1 %
Eosinophils Absolute: 0.4 10*3/uL (ref 0.0–0.5)
Eosinophils Relative: 6 %
HCT: 34.8 % — ABNORMAL LOW (ref 36.0–46.0)
Hemoglobin: 11.6 g/dL — ABNORMAL LOW (ref 12.0–15.0)
Immature Granulocytes: 1 %
Lymphocytes Relative: 43 %
Lymphs Abs: 3.2 10*3/uL (ref 0.7–4.0)
MCH: 33.3 pg (ref 26.0–34.0)
MCHC: 33.3 g/dL (ref 30.0–36.0)
MCV: 100 fL (ref 80.0–100.0)
Monocytes Absolute: 0.7 10*3/uL (ref 0.1–1.0)
Monocytes Relative: 9 %
Neutro Abs: 3 10*3/uL (ref 1.7–7.7)
Neutrophils Relative %: 40 %
Platelets: 291 10*3/uL (ref 150–400)
RBC: 3.48 MIL/uL — ABNORMAL LOW (ref 3.87–5.11)
RDW: 13.5 % (ref 11.5–15.5)
WBC: 7.3 10*3/uL (ref 4.0–10.5)
nRBC: 0 % (ref 0.0–0.2)

## 2020-08-29 LAB — RESP PANEL BY RT-PCR (FLU A&B, COVID) ARPGX2
Influenza A by PCR: NEGATIVE
Influenza B by PCR: NEGATIVE
SARS Coronavirus 2 by RT PCR: NEGATIVE

## 2020-08-29 LAB — GLUCOSE, CAPILLARY
Glucose-Capillary: 143 mg/dL — ABNORMAL HIGH (ref 70–99)
Glucose-Capillary: 199 mg/dL — ABNORMAL HIGH (ref 70–99)
Glucose-Capillary: 250 mg/dL — ABNORMAL HIGH (ref 70–99)

## 2020-08-29 LAB — COMPREHENSIVE METABOLIC PANEL
ALT: 26 U/L (ref 0–44)
AST: 34 U/L (ref 15–41)
Albumin: 3 g/dL — ABNORMAL LOW (ref 3.5–5.0)
Alkaline Phosphatase: 78 U/L (ref 38–126)
Anion gap: 9 (ref 5–15)
BUN: 26 mg/dL — ABNORMAL HIGH (ref 8–23)
CO2: 30 mmol/L (ref 22–32)
Calcium: 9.5 mg/dL (ref 8.9–10.3)
Chloride: 95 mmol/L — ABNORMAL LOW (ref 98–111)
Creatinine, Ser: 1.53 mg/dL — ABNORMAL HIGH (ref 0.44–1.00)
GFR, Estimated: 36 mL/min — ABNORMAL LOW (ref >60)
Glucose, Bld: 179 mg/dL — ABNORMAL HIGH (ref 70–99)
Potassium: 4.7 mmol/L (ref 3.5–5.1)
Sodium: 134 mmol/L — ABNORMAL LOW (ref 135–145)
Total Bilirubin: 0.4 mg/dL (ref 0.3–1.2)
Total Protein: 6.1 g/dL — ABNORMAL LOW (ref 6.5–8.1)

## 2020-08-29 LAB — MAGNESIUM: Magnesium: 2.1 mg/dL (ref 1.7–2.4)

## 2020-08-29 MED ORDER — PANTOPRAZOLE SODIUM 40 MG PO TBEC: 40.0000 mg | DELAYED_RELEASE_TABLET | Freq: Every day | ORAL | Status: AC

## 2020-08-29 MED ORDER — INSULIN NPH ISOPHANE & REGULAR (70-30) 100 UNIT/ML ~~LOC~~ SUSP: 30.0000 [IU] | Freq: Two times a day (BID) | SUBCUTANEOUS | 0 refills | Status: DC

## 2020-08-29 MED ORDER — IBUPROFEN 200 MG PO TABS: 400.0000 mg | ORAL_TABLET | Freq: Three times a day (TID) | ORAL | Status: DC | PRN

## 2020-08-29 MED ORDER — OXYCODONE HCL 10 MG PO TABS: 10.0000 mg | ORAL_TABLET | ORAL | 0 refills | Status: AC | PRN

## 2020-08-29 MED ORDER — ACETAMINOPHEN 500 MG PO TABS: 1000.0000 mg | ORAL_TABLET | Freq: Three times a day (TID) | ORAL | 0 refills | Status: AC

## 2020-08-29 MED ORDER — SENNOSIDES-DOCUSATE SODIUM 8.6-50 MG PO TABS: 2.0000 | ORAL_TABLET | Freq: Every day | ORAL | Status: DC

## 2020-08-29 MED ORDER — CYCLOBENZAPRINE HCL 10 MG PO TABS: 10.0000 mg | ORAL_TABLET | Freq: Three times a day (TID) | ORAL | 0 refills | Status: AC

## 2020-08-29 MED ORDER — LIDOCAINE 5 % EX PTCH: 2.0000 | MEDICATED_PATCH | CUTANEOUS | 0 refills | Status: DC

## 2020-08-29 MED ORDER — POLYETHYLENE GLYCOL 3350 17 G PO PACK: 17.0000 g | PACK | Freq: Every day | ORAL | 0 refills | Status: AC

## 2020-08-29 MED ORDER — IBUPROFEN 800 MG PO TABS: 800.0000 mg | ORAL_TABLET | Freq: Three times a day (TID) | ORAL | 0 refills | Status: AC | PRN

## 2020-08-29 NOTE — Progress Notes (Signed)
Occupational Therapy Treatment Patient Details Name: DEONE OMAHONEY MRN: [id number] DOB: [birth year] Today's Date: 08/29/2020    History of present illness Pt is 70 yo female with chronic low back pain had a fall about 4 to 5 days ago since then the pain is acutely worsened radiating to left lower extremity. Imaging revealed chronic L1 and L5 fx, L2-5 fusion.  Neurosurgery consulted and recommended pain management but no surgical intervention at this time, possible epidural injection if pain not relieved.  Pt with PMH including but not limited to L2-5 fusion 7/21, recent stomach bug with intense vomitting hospitalized 4/3-4/9, chronic back pain, DM, ADIC, CAD, CHF, HTN, MI, OSA, depressive disorder   OT comments  Pt continues to be motivated for therapy and participates well when pain is controlled. Pt completed log rolling with min A for RLE management with use of the bed rails. Pt completed sit<>stand transfer with min A with use of the recliner. In standing, pt was able to use cloth to wipe bottom however could only maintain this position/action for a few seconds. In sitting, pt demonstrated good trunk control with lateral and anterior leans while bathing her upper thighs and perineal area. Pt continues to benefit from acute OT to increase function in ADLs and mobility. D/c recommendation remains appropriate.    Follow Up Recommendations  SNF    Equipment Recommendations  3 in 1 bedside commode       Precautions / Restrictions Precautions Precautions: Fall Precaution Comments: Intense Pain Required Braces or Orthoses: Spinal Brace Spinal Brace: Lumbar corset;Applied in sitting position Restrictions Weight Bearing Restrictions: No Other Position/Activity Restrictions: back brace       Mobility Bed Mobility Overal bed mobility: Needs Assistance Bed Mobility: Rolling Rolling: Min assist         General bed mobility comments: A for BLE due to pain    Transfers Overall  transfer level: Needs assistance Equipment used:  (recliner chair) Transfers: Sit to/from Stand Sit to Stand: Min assist;From elevated surface (with use of recliner chair placed in front of pt)         General transfer comment: pt motivated to stand this session, completed sit<>stand with min A given verbal cues fro hand placement. Pt stood for ~2 minutes on the first trial and 1 minute the second    Balance Overall balance assessment: History of Falls                 ADL either performed or assessed with clinical judgement   ADL Overall ADL's : Needs assistance/impaired Eating/Feeding: Bed level;Independent   Grooming: Set up;Bed level       Lower Body Bathing: Bed level;Sit to/from stand;Maximal assistance Lower Body Bathing Details (indicate cue type and reason): pt washed thighs and perineal area this session wtih min A, would require further A for below the knees and rear perineal for thoroughness             Toileting- Clothing Manipulation and Hygiene: Maximal assistance;+2 for physical assistance;Sit to/from stand       Functional mobility during ADLs: Maximal assistance;+2 for physical assistance General ADL Comments: pt completed lower body bathing in sitting with min A for balance after soiling bed               Cognition Arousal/Alertness: Awake/alert Behavior During Therapy: WFL for tasks assessed/performed Overall Cognitive Status: Within Functional Limits for tasks assessed               General Comments:  Pt is motivated for OOB activity, however limited by pain              General Comments no skin integrity issues noted    Pertinent Vitals/ Pain       Pain Assessment: Faces Faces Pain Scale: Hurts whole lot Pain Location: low back and B thighs and hips Pain Descriptors / Indicators: Grimacing;Guarding Pain Intervention(s): Limited activity within patient's tolerance;Monitored during session;Repositioned         Frequency   Min 2X/week        Progress Toward Goals  OT Goals(current goals can now be found in the care plan section)  Progress towards OT goals: Progressing toward goals  Acute Rehab OT Goals Patient Stated Goal: decrease pain OT Goal Formulation: With patient Time For Goal Achievement: 09/06/20 Potential to Achieve Goals: Fair ADL Goals Pt Will Perform Lower Body Bathing: with min assist;with adaptive equipment;sit to/from stand Pt Will Perform Upper Body Dressing: with set-up;sitting Pt Will Transfer to Toilet: with min assist;stand pivot transfer;bedside commode Additional ADL Goal #1: Pt will independently utilize relaxation techniques for pain management  Plan Discharge plan remains appropriate       AM-PAC OT "6 Clicks" Daily Activity     Outcome Measure   Help from another person eating meals?: None Help from another person taking care of personal grooming?: A Little Help from another person toileting, which includes using toliet, bedpan, or urinal?: A Lot Help from another person bathing (including washing, rinsing, drying)?: A Lot Help from another person to put on and taking off regular upper body clothing?: A Lot Help from another person to put on and taking off regular lower body clothing?: A Lot 6 Click Score: 15    End of Session Equipment Utilized During Treatment: Other (comment) (recliner chair)  OT Visit Diagnosis: History of falling (Z91.81);Muscle weakness (generalized) (M62.81);Pain Pain - Right/Left: Right Pain - part of body: Leg (and back)   Activity Tolerance Patient limited by pain   Patient Left in bed;with call bell/phone within reach;with bed alarm set   Nurse Communication Mobility status        Time: 5035-4656 OT Time Calculation (min): 26 min  Charges: OT General Charges $OT Visit: 1 Visit OT Treatments $Self Care/Home Management : 23-37 mins    Jayleena Stille A Jamyra Zweig 08/29/2020, 2:31 PM

## 2020-08-29 NOTE — Plan of Care (Signed)
  Problem: Education: Goal: Knowledge of General Education information will improve Description: Including pain rating scale, medication(s)/side effects and non-pharmacologic comfort measures Outcome: Adequate for Discharge   Problem: Health Behavior/Discharge Planning: Goal: Ability to manage health-related needs will improve Outcome: Adequate for Discharge   Problem: Clinical Measurements: Goal: Ability to maintain clinical measurements within normal limits will improve Outcome: Adequate for Discharge Goal: Will remain free from infection Outcome: Adequate for Discharge Goal: Diagnostic test results will improve Outcome: Adequate for Discharge Goal: Respiratory complications will improve Outcome: Adequate for Discharge Goal: Cardiovascular complication will be avoided Outcome: Adequate for Discharge   Problem: Activity: Goal: Risk for activity intolerance will decrease Outcome: Adequate for Discharge   Problem: Nutrition: Goal: Adequate nutrition will be maintained Outcome: Adequate for Discharge   Problem: Coping: Goal: Level of anxiety will decrease Outcome: Adequate for Discharge   Problem: Elimination: Goal: Will not experience complications related to bowel motility Outcome: Adequate for Discharge Goal: Will not experience complications related to urinary retention Outcome: Adequate for Discharge   Problem: Pain Managment: Goal: General experience of comfort will improve Outcome: Adequate for Discharge   Problem: Safety: Goal: Ability to remain free from injury will improve Outcome: Adequate for Discharge   Problem: Skin Integrity: Goal: Risk for impaired skin integrity will decrease Outcome: Adequate for Discharge   Problem: Acute Rehab PT Goals(only PT should resolve) Goal: Pt will Roll Supine to Side Outcome: Adequate for Discharge Goal: Pt Will Go Supine/Side To Sit Outcome: Adequate for Discharge Goal: Pt Will Go Sit To Supine/Side Outcome:  Adequate for Discharge Goal: Patient Will Transfer Sit To/From Stand Outcome: Adequate for Discharge Goal: Pt Will Transfer Bed To Chair/Chair To Bed Outcome: Adequate for Discharge Goal: Pt Will Ambulate Outcome: Adequate for Discharge Goal: PT Additional Goal #1 Outcome: Adequate for Discharge   Problem: Acute Rehab OT Goals (only OT should resolve) Goal: Pt. Will Perform Lower Body Bathing Outcome: Adequate for Discharge Goal: Pt. Will Perform Upper Body Dressing Outcome: Adequate for Discharge Goal: Pt. Will Transfer To Toilet Outcome: Adequate for Discharge Goal: OT Additional ADL Goal #1 Outcome: Adequate for Discharge

## 2020-08-29 NOTE — TOC Transition Note (Signed)
Transition of Care [facility]) - CM/SW Discharge Note   Patient Details  Name: Dana Grant MRN: [id number] Date of Birth: 08/22/1950  [facility] of [facility]) CM/SW Contact:  Ralene Bathe, LCSWA Phone Number: 08/29/2020, 2:29 PM   Clinical Narrative:    Patient will DC to: [facility] Anticipated DC date: 4.27.2022 Family notified: Yes Transport by: Sharin Mons   Per MD patient ready for DC to SNF . RN to call report prior to discharge [phone]. RN, patient, patient's family, and facility notified of DC. Discharge Summary and FL2 sent to facility. DC packet on chart. Ambulance transport requested for patient.   CSW will sign off for now as social work intervention is no longer needed. Please consult [place] again if new needs arise.     Final next level of care: Skilled Nursing Facility Barriers to Discharge: Barriers Resolved   Patient Goals and CMS Choice Patient states their goals for this hospitalization and ongoing recovery are:: To feel better CMS Medicare.gov Compare Post Acute Care list provided to:: Patient Choice offered to / list presented to : Patient  Discharge Placement              Patient [facility] bed at: [facility] Patient to be transferred to facility by: [facility] Name of family member notified: DIVYA, MUNSHI (Spouse)   [phone] Patient and family notified of of transfer: 08/29/20  Discharge Plan and Services     Post Acute Care Choice: Skilled Nursing Facility                               Social Determinants of Health (SDOH) Interventions     Readmission Risk Interventions No flowsheet data found.

## 2020-08-29 NOTE — Progress Notes (Signed)
Pt was given her AVS discharge summary and went over with her. Extra copy was placed with chart for receiving facility also. Report was called to Fleet Contras at [facility]. IV was removed with catheter intact. [facility] was called for transport. Pt had no further questions.

## 2020-08-29 NOTE — Discharge Summary (Signed)
Dana Grant ZOX:[id number] DOB: 28-Jun-1950 DOA: 08/21/2020  PCP: Pcp, No  Admit date: 08/21/2020 Discharge date: 08/29/2020  Admitt2ed From: Home Disposition: SNF  Recommendations for Outpatient Follow-up:  1. Follow up with PCP in 1 week 2. Please obtain BMP/CBC in one week 3. Follow-up with neurosurgery in 1 week 4. Follow-up with cardiology in 1 week     Discharge Condition:Stable CODE STATUS: Full Diet recommendation:  Carb Modified  Brief/Interim Summary: Per WJX:BJYNWGHPI:Dana C [name] is a 70 y.o. female with history of chronic low back pain had a fall about 4 to 5 days ago since then the pain is acutely worsened radiating to left lower extremity.  Denied any incontinence of bowel or urine.  1.  Intractable lower back pain. L1 and L5 chronic fracture. L2 L5 posterior fusion with foraminal stenosis. Moderate to severe bilateral foraminal stenosis T12-L1. Neurosurgery consulted appreciate assistance. Recommend no intervention right now. Neurosurgery consulted palliative care for pain management. Initially was on Dilaudid.  Later on was placed on scheduled OxyContin.  Due to excessive sleepiness both medications are discontinued. Was on Robaxin.  Due to ineffectiveness currently on Flexeril. Also on as needed oxycodone. Brace as needed. Outpatient intraspinal injection recommended.  Recommend follow-up with neurosurgery. palliative care and assist in this patient's pain as well as arranging long-term care plan for her pain control. Patient does not have a PCP currently but does have insurance..>needs to get established with one. PT recommends SNF Outpatient follow-up with neurosurgery with plan for ESI.  2.  Chronic anxiety Continue Xanax home regimen.  3.  Accelerated hypertension Chronic systolic CHF Currently appears euvolemic. Continue home regimen. Hold lasix as renal function elevated. Would reassess as outpt by cardiology   4.  Hypothyroidism Continue  Synthroid.  5.  Elevated troponin Demand ischemia in the setting of hypertension and pain. No chest pain or shortness of breath or anginal equivalent symptoms. Monitor. EKG also negative for any acute ischemic changes.  6.  Obesity OSA CPAP nightly. Body mass index is 38.89 kg/m.   7.  Constipation.  Resolved Continue bowel regimen. Currently on MiraLAX and Senokot.  8.  AKI.  Mild hyperkalemia Baseline serum creatinine around 1.  serum creatinine at the 1.5.  Secondary to poor p.o. intake in the setting of polypharmacy. Need check bmp in 2 days Encouraged po intake. K level stable now.   Discharge Diagnoses:  Principal Problem:   Back pain Active Problems:   Hypothyroidism   Hyperlipidemia, mixed   Essential hypertension   Low back pain   Lumbago    Discharge Instructions  Discharge Instructions    Call MD for:  severe uncontrolled pain   Complete by: As directed    Diet - low sodium heart healthy   Complete by: As directed    Increase activity slowly   Complete by: As directed      Allergies as of 08/29/2020      Reactions   Codeine Nausea And Vomiting   Sulfa Antibiotics Nausea And Vomiting   Demerol [meperidine]    Pt has no recollection of this   Lisinopril    Pt has no recollection of this   Morphine And Related Other (See Comments)   Gastritis with morphine, can tolerate dilaudid.    Penicillins    Pt has no recollection of this      Medication List    STOP taking these medications   aspirin EC 81 MG tablet   furosemide 20 MG tablet Commonly known as:  LASIX   furosemide 40 MG tablet Commonly known as: LASIX   methocarbamol 500 MG tablet Commonly known as: ROBAXIN     TAKE these medications   acetaminophen 500 MG tablet Commonly known as: TYLENOL Take 2 tablets (1,000 mg total) by mouth every 8 (eight) hours.   ALPRAZolam 1 MG tablet Commonly known as: XANAX Take 1 mg by mouth 3 (three) times daily as needed for anxiety.    carvedilol 6.25 MG tablet Commonly known as: COREG Take 6.25 mg by mouth 2 (two) times daily.   cyanocobalamin 1000 MCG/ML injection Commonly known as: (VITAMIN B-12) Inject 1,000 mcg into the muscle every 14 (fourteen) days.   cyclobenzaprine 10 MG tablet Commonly known as: FLEXERIL Take 1 tablet (10 mg total) by mouth 3 (three) times daily for 7 days.   DULoxetine 30 MG capsule Commonly known as: CYMBALTA Take 30 mg by mouth in the morning and at bedtime.   gabapentin 600 MG tablet Commonly known as: NEURONTIN Take 600 mg by mouth See admin instructions. Take 600 mg up to 2 times daily as needed for neuropathy   HYDROcodone-acetaminophen 5-325 MG tablet Commonly known as: NORCO/VICODIN Take 1-2 tablets by mouth every 4 (four) hours as needed (pain). What changed: when to take this   ibuprofen 800 MG tablet Commonly known as: ADVIL Take 1 tablet (800 mg total) by mouth every 8 (eight) hours as needed for up to 3 days for mild pain or moderate pain. What changed: reasons to take this   insulin NPH-regular Human (70-30) 100 UNIT/ML injection Inject 30 Units into the skin 2 (two) times daily. What changed: how much to take   levothyroxine 75 MCG tablet Commonly known as: SYNTHROID Take 75 mcg by mouth daily before breakfast.   lidocaine 5 % Commonly known as: LIDODERM Place 2 patches onto the skin daily. Remove & Discard patch within 12 hours or as directed by MD Start taking on: August 30, 2020   meclizine 25 MG tablet Commonly known as: ANTIVERT Take 25 mg by mouth daily as needed (inner ear issues as needed).   Oxycodone HCl 10 MG Tabs Take 1 tablet (10 mg total) by mouth every 4 (four) hours as needed for up to 3 days for severe pain.   pantoprazole 40 MG tablet Commonly known as: PROTONIX Take 1 tablet (40 mg total) by mouth daily at 12 noon. Start taking on: August 30, 2020   polyethylene glycol 17 g packet Commonly known as: MIRALAX / GLYCOLAX Take 17 g  by mouth daily. Start taking on: August 30, 2020   senna-docusate 8.6-50 MG tablet Commonly known as: Senokot-S Take 2 tablets by mouth at bedtime.   Vitamin D (Ergocalciferol) 1.25 MG (50000 UNIT) Caps capsule Commonly known as: DRISDOL Take 50,000 Units by mouth 2 (two) times a week.       Contact information for follow-up providers    Lisbeth Renshaw, MD Follow up in 1 week(s).   Specialty: Neurosurgery Contact information: [address] [place] [address] [phone]        Laqueta Linden, MD Follow up in 1 week(s).   Specialty: Cardiology           Contact information for after-discharge care    Destination    [facility] .   Service: Skilled Paramedic information: [address] [place] [address] [phone]  Allergies  Allergen Reactions  . Codeine Nausea And Vomiting  . Sulfa Antibiotics Nausea And Vomiting  . Demerol [Meperidine]     Pt has no recollection of this  . Lisinopril     Pt has no recollection of this  . Morphine And Related Other (See Comments)    Gastritis with morphine, can tolerate dilaudid.   Marland Kitchen Penicillins     Pt has no recollection of this    Consultations:  Neurosurgery, palliative care   Procedures/Studies: CT Lumbar Spine Wo Contrast  Result Date: 08/21/2020 CLINICAL DATA:  Fall today.  Low back and bilateral hip pain. EXAM: CT LUMBAR SPINE WITHOUT CONTRAST TECHNIQUE: Multidetector CT imaging of the lumbar spine was performed without intravenous contrast administration. Multiplanar CT image reconstructions were also generated. COMPARISON:  Lumbar spine radiographs 08/01/2020 and earlier. Lumbar CT myelogram 07/18/2019. FINDINGS: Segmentation: 5 lumbar type vertebrae. Alignment: 6 mm anterolisthesis of L4 on L5. Vertebrae: Displaced fracture or involving the superior and anterior aspects of the L1 vertebral body with 55% anterior  vertebral body height loss, new from the 07/2019 CT but chronic in appearance and present on multiple interval radiographs including 12/05/2019. Displaced, healed fracture of the L5 vertebral body, also new from 07/2019 but present on interval radiographs. L2-L5 posterior and interbody fusion with subsidence of the interbody spacer at L4-5 in the setting of the chronic L5 vertebral fracture. Minimal lucency surrounding both L5 pedicle screws. Slight traversal of the lateral recess by the right L5 screw. Solid interbody osseous fusion at L2-3 and L3-4 without solid interbody fusion at L4-5. Solid posterolateral osseous fusion at each of the operative levels. Chronic interbody and facet ankylosis at L5-S1. No acute fracture or suspicious osseous lesion. Paraspinal and other soft tissues: Hiatal hernia. Abdominal aortic atherosclerosis without aneurysm. Atelectasis and possible trace pleural effusion in the left lung base. Disc levels: T10-11: Disc bulging, endplate spurring, and mild facet arthrosis result in mild bilateral neural foraminal stenosis without evidence of significant spinal stenosis. T11-12: Disc bulging and mild-to-moderate left facet arthrosis without evidence of significant stenosis. T12-L1: Disc bulging with a large, broad posterior osteophyte or calcified disc herniation and moderate right and severe left facet arthrosis result in moderate spinal stenosis, left greater than right lateral recess stenosis, and moderate to severe bilateral neural foraminal stenosis, progressed from the prior CT. L1-2: Mild disc bulging and mild facet and ligamentum flavum hypertrophy without evidence of significant stenosis. L2-3: Interval fusion with improved spinal canal patency compared to the prior CT. Moderate left greater than right neural foraminal stenosis due to disc bulging, endplate spurring, and asymmetric left facet hypertrophy. L3-4: Interval fusion with improved spinal canal patency compared to the prior  CT. Moderate left neural foraminal stenosis due to disc bulging, endplate spurring, and asymmetric left facet hypertrophy. L4-5: Fusion with limited assessment of the spinal canal due to streak artifact. Moderate to severe bilateral neural foraminal stenosis due to disc bulging, endplate spurring, and facet hypertrophy. L5-S1: Chronic interbody and facet ankylosis. No evidence of significant spinal stenosis. Mild to moderate osseous neural foraminal stenosis bilaterally. IMPRESSION: 1. No acute osseous abnormality identified in the lumbar spine. 2. Chronic fractures of the L1 and L5 vertebral bodies. 3. L2-L5 posterior and interbody fusion with residual neural foraminal stenosis as above. 4. Moderate spinal stenosis and moderate to severe bilateral neural foraminal stenosis at T12-L1, progressed from 07/2019. 5. Aortic Atherosclerosis (ICD10-I70.0). Electronically Signed   By: Sebastian Ache M.D.   On: 08/21/2020 13:18   CT  PELVIS WO CONTRAST  Result Date: 08/21/2020 CLINICAL DATA:  Bilateral hip pain after fall. EXAM: CT PELVIS WITHOUT CONTRAST TECHNIQUE: Multidetector CT imaging of the pelvis was performed following the standard protocol without intravenous contrast. COMPARISON:  None. FINDINGS: Urinary Tract:  No abnormality visualized. Bowel:  Unremarkable visualized pelvic bowel loops. Vascular/Lymphatic: No pathologically enlarged lymph nodes. No significant vascular abnormality seen. Reproductive: Status post hysterectomy. No adnexal abnormality is noted. Other:  None. Musculoskeletal: No suspicious bone lesions identified. No fracture is noted. IMPRESSION: No fracture or other significant bony abnormality is noted. Electronically Signed   By: Lupita Raider M.D.   On: 08/21/2020 13:18   CT NO CHARGE  Result Date: 08/22/2020 CLINICAL DATA:  Flank pain with kidney stone suspected EXAM: CT ADDITIONAL VIEWS AT NO CHARGE TECHNIQUE: Open field of view to cover the abdominal soft tissues was performed.  CONTRAST:  None COMPARISON:  None similar FINDINGS: The pelvis is incompletely covered. Biventricular pacer. Small sliding hiatal hernia. Atelectasis at the bases. No hydronephrosis or visible urolithiasis. The visualized bladder is distended. Enteric contrast is seen in the mid colon. No evidence of bowel obstruction or inflammation. Atheromatous calcification. Osseous findings are described separately. IMPRESSION: No acute finding in the covered abdominal and pelvic viscera. No hydronephrosis or visible ureteral calculus (the lower ureters are incompletely covered). Electronically Signed   By: Marnee Spring M.D.   On: 08/22/2020 04:40   DG Hips Bilat W or Wo Pelvis 3-4 Views  Result Date: 08/21/2020 CLINICAL DATA:  Pain after trauma. EXAM: DG HIP (WITH OR WITHOUT PELVIS) 3-4V BILAT COMPARISON:  None. FINDINGS: AP view of the pelvis and AP/frog leg views of both hips. Incompletely imaged lumbar spine fixation. Left greater than right sacroiliac joint sclerosis which is likely degenerative. No acute fracture or dislocation. IMPRESSION: No acute osseous abnormality. Electronically Signed   By: Jeronimo Greaves M.D.   On: 08/21/2020 11:36      Subjective: Patient has no new complaints today.-At bedside.  Denies shortness of breath or chest pain.  Back pain with movements   Discharge Exam: Vitals:   08/28/20 1946 08/29/20 0817  BP: 118/66 (!) 105/58  Pulse: 87 75  Resp: 18 14  Temp: 98.3 F (36.8 C) (!) 97.4 F (36.3 C)  SpO2: 98% 96%   Vitals:   08/28/20 0747 08/28/20 1338 08/28/20 1946 08/29/20 0817  BP: (!) 138/59 (!) 183/88 118/66 (!) 105/58  Pulse: 80 91 87 75  Resp: 17 17 18 14   Temp: 97.8 F (36.6 C) 97.9 F (36.6 C) 98.3 F (36.8 C) (!) 97.4 F (36.3 C)  TempSrc: Oral Oral Oral Oral  SpO2: 97% 98% 98% 96%  Weight:      Height:        General: Pt is alert, awake, not in acute distress Cardiovascular: RRR, S1/S2 +, no rubs, no gallops Respiratory: CTA bilaterally, no  wheezing, no rhonchi Abdominal: Soft, NT, ND, bowel sounds + Extremities: no edema    The results of significant diagnostics from this hospitalization (including imaging, microbiology, ancillary and laboratory) are listed below for reference.     Microbiology: Recent Results (from the past 240 hour(s))  Resp Panel by RT-PCR (Flu A&B, Covid) Nasopharyngeal Swab     Status: None   Collection Time: 08/21/20  7:01 PM   Specimen: Nasopharyngeal Swab; Nasopharyngeal(NP) swabs in vial transport medium  Result Value Ref Range Status   SARS Coronavirus 2 by RT PCR NEGATIVE NEGATIVE Final    Comment: (NOTE) SARS-CoV-2  target nucleic acids are NOT DETECTED.  The SARS-CoV-2 RNA is generally detectable in upper respiratory specimens during the acute phase of infection. The lowest concentration of SARS-CoV-2 viral copies this assay can detect is 138 copies/mL. A negative result does not preclude SARS-Cov-2 infection and should not be used as the sole basis for treatment or other patient management decisions. A negative result may occur with  improper specimen collection/handling, submission of specimen other than nasopharyngeal swab, presence of viral mutation(s) within the areas targeted by this assay, and inadequate number of viral copies(<138 copies/mL). A negative result must be combined with clinical observations, patient history, and epidemiological information. The expected result is Negative.  Fact Sheet for Patients:  BloggerCourse.com  Fact Sheet for Healthcare Providers:  SeriousBroker.it  This test is no t yet approved or cleared by the [place] FDA and  has been authorized for detection and/or diagnosis of SARS-CoV-2 by FDA under an Emergency Use Authorization (EUA). This EUA will remain  in effect (meaning this test can be used) for the duration of the COVID-19 declaration under Section 564(b)(1) of the Act,  21 U.S.C.section 360bbb-3(b)(1), unless the authorization is terminated  or revoked sooner.       Influenza A by PCR NEGATIVE NEGATIVE Final   Influenza B by PCR NEGATIVE NEGATIVE Final    Comment: (NOTE) The Xpert Xpress SARS-CoV-2/FLU/RSV plus assay is intended as an aid in the diagnosis of influenza from Nasopharyngeal swab specimens and should not be used as a sole basis for treatment. Nasal washings and aspirates are unacceptable for Xpert Xpress SARS-CoV-2/FLU/RSV testing.  Fact Sheet for Patients: BloggerCourse.com  Fact Sheet for Healthcare Providers: SeriousBroker.it  This test is not yet approved or cleared by the [place] FDA and has been authorized for detection and/or diagnosis of SARS-CoV-2 by FDA under an Emergency Use Authorization (EUA). This EUA will remain in effect (meaning this test can be used) for the duration of the COVID-19 declaration under Section 564(b)(1) of the Act, 21 U.S.C. section 360bbb-3(b)(1), unless the authorization is terminated or revoked.  Performed at [facility], [address] [place], [place] [address]      Labs: BNP (last 3 results) No results for input(s): BNP in the last 8760 hours. Basic Metabolic Panel: Recent Labs  Lab 08/25/20 0818 08/28/20 0424 08/29/20 0445  NA 135 135 134*  K 4.5 5.4* 4.7  CL 99 96* 95*  CO2 26 32 30  GLUCOSE 178* 197* 179*  BUN 20 25* 26*  CREATININE 1.04* 1.43*  1.51* 1.53*  CALCIUM 8.9 9.2 9.5  MG  --   --  2.1   Liver Function Tests: Recent Labs  Lab 08/29/20 0445  AST 34  ALT 26  ALKPHOS 78  BILITOT 0.4  PROT 6.1*  ALBUMIN 3.0*   No results for input(s): LIPASE, AMYLASE in the last 168 hours. No results for input(s): AMMONIA in the last 168 hours. CBC: Recent Labs  Lab 08/25/20 0818 08/29/20 0445  WBC 8.7 7.3  NEUTROABS  --  3.0  HGB 11.9* 11.6*  HCT 35.1* 34.8*  MCV 97.2 100.0  PLT 280 291   Cardiac  Enzymes: No results for input(s): CKTOTAL, CKMB, CKMBINDEX, TROPONINI in the last 168 hours. BNP: Invalid input(s): POCBNP CBG: Recent Labs  Lab 08/28/20 1136 08/28/20 1559 08/28/20 2000 08/29/20 0645 08/29/20 1114  GLUCAP 214* 311* 232* 143* 199*   D-Dimer No results for input(s): DDIMER in the last 72 hours. Hgb A1c No results for  input(s): HGBA1C in the last 72 hours. Lipid Profile No results for input(s): CHOL, HDL, LDLCALC, TRIG, CHOLHDL, LDLDIRECT in the last 72 hours. Thyroid function studies No results for input(s): TSH, T4TOTAL, T3FREE, THYROIDAB in the last 72 hours.  Invalid input(s): FREET3 Anemia work up No results for input(s): VITAMINB12, FOLATE, FERRITIN, TIBC, IRON, RETICCTPCT in the last 72 hours. Urinalysis    Component Value Date/Time   COLORURINE YELLOW 08/21/2020 1750   APPEARANCEUR HAZY (A) 08/21/2020 1750   LABSPEC 1.018 08/21/2020 1750   PHURINE 6.0 08/21/2020 1750   GLUCOSEU NEGATIVE 08/21/2020 1750   HGBUR NEGATIVE 08/21/2020 1750   BILIRUBINUR NEGATIVE 08/21/2020 1750   KETONESUR 5 (A) 08/21/2020 1750   PROTEINUR NEGATIVE 08/21/2020 1750   NITRITE NEGATIVE 08/21/2020 1750   LEUKOCYTESUR LARGE (A) 08/21/2020 1750   Sepsis Labs Invalid input(s): PROCALCITONIN,  WBC,  LACTICIDVEN Microbiology Recent Results (from the past 240 hour(s))  Resp Panel by RT-PCR (Flu A&B, Covid) Nasopharyngeal Swab     Status: None   Collection Time: 08/21/20  7:01 PM   Specimen: Nasopharyngeal Swab; Nasopharyngeal(NP) swabs in vial transport medium  Result Value Ref Range Status   SARS Coronavirus 2 by RT PCR NEGATIVE NEGATIVE Final    Comment: (NOTE) SARS-CoV-2 target nucleic acids are NOT DETECTED.  The SARS-CoV-2 RNA is generally detectable in upper respiratory specimens during the acute phase of infection. The lowest concentration of SARS-CoV-2 viral copies this assay can detect is 138 copies/mL. A negative result does not preclude SARS-Cov-2 infection  and should not be used as the sole basis for treatment or other patient management decisions. A negative result may occur with  improper specimen collection/handling, submission of specimen other than nasopharyngeal swab, presence of viral mutation(s) within the areas targeted by this assay, and inadequate number of viral copies(<138 copies/mL). A negative result must be combined with clinical observations, patient history, and epidemiological information. The expected result is Negative.  Fact Sheet for Patients:  BloggerCourse.com  Fact Sheet for Healthcare Providers:  SeriousBroker.it  This test is no t yet approved or cleared by the [place] FDA and  has been authorized for detection and/or diagnosis of SARS-CoV-2 by FDA under an Emergency Use Authorization (EUA). This EUA will remain  in effect (meaning this test can be used) for the duration of the COVID-19 declaration under Section 564(b)(1) of the Act, 21 U.S.C.section 360bbb-3(b)(1), unless the authorization is terminated  or revoked sooner.       Influenza A by PCR NEGATIVE NEGATIVE Final   Influenza B by PCR NEGATIVE NEGATIVE Final    Comment: (NOTE) The Xpert Xpress SARS-CoV-2/FLU/RSV plus assay is intended as an aid in the diagnosis of influenza from Nasopharyngeal swab specimens and should not be used as a sole basis for treatment. Nasal washings and aspirates are unacceptable for Xpert Xpress SARS-CoV-2/FLU/RSV testing.  Fact Sheet for Patients: BloggerCourse.com  Fact Sheet for Healthcare Providers: SeriousBroker.it  This test is not yet approved or cleared by the [place] FDA and has been authorized for detection and/or diagnosis of SARS-CoV-2 by FDA under an Emergency Use Authorization (EUA). This EUA will remain in effect (meaning this test can be used) for the duration of the COVID-19 declaration  under Section 564(b)(1) of the Act, 21 U.S.C. section 360bbb-3(b)(1), unless the authorization is terminated or revoked.  Performed at [facility], [address] [place], [place] [address]      Time coordinating discharge: Over 30 minutes  SIGNED:   Lynn Ito,  MD  [facility] 08/29/2020, 12:47 PM Pager   If 7PM-7AM, please contact night-coverage [email or web address] Password [email or web address]

## 2020-08-29 NOTE — Progress Notes (Signed)
This chaplain is present for F/U spiritual care.  The Pt. husband-Ken is at the Pt. Bedside.  The Pt. recognizes the strength in the "47" years of marriage in the introduction.  The Pt. shares she is more comfortable than yesterday and shows the chaplain the mobility she has in her legs today.  The chaplain understands the Pt. is willing to explore different practices of mindfulness/meditation for pain assistance with the chaplain. This chaplain will explore different options that will compliment the Pt. faith.  The Pt. clearly wants the health care team to know and gives permission to chart her "faith is stronger than her fear"  as she moves through this admission.  This chaplain is available for F/U spiritual care as needed.

## 2020-08-29 NOTE — Progress Notes (Addendum)
Inpatient Diabetes Program Recommendations  AACE/ADA: New Consensus Statement on Inpatient Glycemic Control (2015)  Target Ranges:  Prepandial:   less than 140 mg/dL      Peak postprandial:   less than 180 mg/dL (1-2 hours)      Critically ill patients:  140 - 180 mg/dL   Results for Dana Grant, Dana Grant (MRN [id number]) as of 08/29/2020 07:22  Ref. Range 08/28/2020 08:16 08/28/2020 11:36 08/28/2020 15:59 08/28/2020 20:00  Glucose-Capillary Latest Ref Range: 70 - 99 mg/dL 194 (H) 174 (H) 081 (H) 232 (H)   Results for Dana Grant, Dana Grant (MRN [id number]) as of 08/29/2020 07:22  Ref. Range 08/29/2020 06:45  Glucose-Capillary Latest Ref Range: 70 - 99 mg/dL 497 (H)   Admit with: Intractable lower back pain L1 and L5 chronic fracture  History: DM, CHF  Home DM Meds: 70/30 Insulin 60 units BID  Current Orders: 70/30 Insulin 30 units BID with meals                            Novolog Sensitive Correction Scale/ SSI (0-9 units) TID AC    MD- Note CBGs higher in the afternoon  1. Please consider increasing the AM dose of the 70/30 Insulin to 40 units QAM with breakfast  2. Please also consider increasing the Novolog SSI to the 0-15 unit (Moderate) scale    --Will follow patient during hospitalization--  Ambrose Finland RN, MSN, CDE Diabetes Coordinator Inpatient Glycemic Control Team Team Pager: [phone] (8a-5p)

## 2020-08-29 NOTE — Progress Notes (Signed)
    Progress Note from the Palliative Medicine Team at [facility]   Patient Name: Dana Grant        Date: 08/29/2020 DOB: [birth year]  Age: 70 y.o. MRN#: [id number] Attending Physician: Lynn Ito, MD Primary Care Physician: Pcp, No Admit Date: 08/21/2020   Medical records reviewed    This NP visited patient at the bedside as a follow up for palliative medcine needs and emotional support.  70 YO female with past medical history of CAD, chronic HFrEF, AICD implant, HTN, HLD, hiatal hernia, hypothyroidism, type II DM, OSA on CPAP, B12 deficiency presents with worsening chronic lower back pain. Currently plan is pain control, per neurosurgery no intervention.  Surgical note reviewed from 11-03-19 ( Lumbar stenosis with neurogenic claudication, lumbar spondylolisthesis, herniated lumbar disc, degenerative disc disease, sagittal imbalance, lumbago, radiculopathy ) and 09-05-10 (C6-C7 cervical disc herniation with cervical  spondylosis, cervical degenerative disease, and cervical radiculopathy.)  Ultimately hope is that patient's pain would be "better controlled".  She hopes to discharge to a skilled nursing facility for rehab once stable from this hospitalization.  We discussed the reality that likely she would not be totally free from her chronic pain, patient verbalizes that she believes she could tolerate and function with a pain level of 5 out of 10.  She hopes to secure a physician who will work with her and manage her pain issue into the future.  Husband verbalizes that they are more than willing to come to [facility] for that care.  They live in [place].    I spoke to [facility] today, with Lowella Grip NP and he tells me patient will be called and an appointment will be set up for Dana Grant to be seen in the next two weeks.  Discussed in detail with patient and her husband the importance of keeping this appointment in order to secure pain management into the  future.  Continue with current medications.  Patient tells me she feels her pain is manged on current medciations   -Continue oxycodone as needed -Tylenol 1000 mg p.o. every 8 hours -Ibuprofen 400 mg p.o. 3 times daily -Continue Xanax as needed -Education offered on alternative approaches to augment pain management strategy.  - f/u in the morning for further adjustments related to chronic pain.  Plan is to Dc to SNF for short term rehab  Education offered on the importance of advanced care planning.  Education offered on patient's personal responsibility to her treatment plan, to include exercise, rest, nutrition and attention to the emotional and spiritual components to her overall health and wellness.  Questions and concerns addressed   Discussed with Dr Allena Katz  Total time spent on the unit was 35 minutes  Greater than 50% of the time was spent in counseling and coordination of care  Lorinda Creed NP  Palliative Medicine Team Team Phone # [phone] Pager [phone]

## 2020-09-06 ENCOUNTER — Telehealth: Payer: Self-pay

## 2020-09-06 NOTE — Telephone Encounter (Signed)
LMOVM for patient to send missed HF transmission. 

## 2020-09-07 NOTE — Progress Notes (Signed)
No ICM remote transmission received for 09/03/2020 and next ICM transmission scheduled for 10/15/2020.   

## 2020-10-05 ENCOUNTER — Encounter: Payer: Medicare PPO | Admitting: Internal Medicine

## 2020-10-11 ENCOUNTER — Telehealth: Payer: Self-pay | Admitting: Internal Medicine

## 2020-10-11 NOTE — Telephone Encounter (Signed)
New message    Spoke with patient husband to confirm appointment, he cancelled appointment patient can not physically come into office, she is bed ridden can not walk can not sit up  Can they do virtul ?

## 2020-10-12 ENCOUNTER — Encounter: Payer: Medicare PPO | Admitting: Internal Medicine

## 2020-10-14 NOTE — Telephone Encounter (Signed)
Ok to do video visit with me or EP APP Do not over book

## 2020-10-17 ENCOUNTER — Telehealth: Payer: Self-pay

## 2020-10-17 NOTE — Telephone Encounter (Signed)
LMOVM for patient to send a transmission with her home remote monitor.  

## 2020-10-17 NOTE — Telephone Encounter (Signed)
-----   Message from Hillis Range, MD sent at 10/14/2020 11:53 AM EDT ----- Overdue for remote

## 2020-10-19 NOTE — Progress Notes (Signed)
No ICM remote transmission received for 10/15/2020 and next ICM transmission scheduled for 10/29/2020.    

## 2020-10-23 ENCOUNTER — Encounter: Payer: Self-pay | Admitting: Internal Medicine

## 2020-10-29 ENCOUNTER — Ambulatory Visit (INDEPENDENT_AMBULATORY_CARE_PROVIDER_SITE_OTHER): Payer: Medicare PPO

## 2020-10-29 DIAGNOSIS — I5022 Chronic systolic (congestive) heart failure: Secondary | ICD-10-CM | POA: Diagnosis not present

## 2020-10-29 DIAGNOSIS — Z95810 Presence of automatic (implantable) cardiac defibrillator: Secondary | ICD-10-CM

## 2020-10-31 NOTE — Progress Notes (Signed)
EPIC Encounter for ICM Monitoring  Patient Name: Dana Grant is a 70 y.o. female Date: 10/31/2020 Primary Care Physican: Pcp, No Primary Cardiologist: [facility] Office Electrophysiologist: [facility] Bi-V Pacing: >99%          Weight:  unknown                                                            Spoke with husband per DPR.  Patient was discharged from Nursing home 10/30/2020.  He has a list of new prescriptions but unsure if Lasix was ordered.  He says leg swelling has resolved.  Pt is not mobile at this time.   CorVue thoracic impedance normal but was suggesting possible fluid accumulation from 6/19-6/27.   Prescribed: 08/29/2020 hospital discharge note says to hold Lasix and currently not prescribed   Labs: 08/29/2020 Creatinine 1.53, BUN 26, Potassium 4.7, Sodium 134, GFR 36 08/28/2020 Creatinine 1.43, BUN 25, Potassium 5.4, Sodium 135, GFR 39 08/25/2020 Creatinine 1.04, BUN 20, Potassium 4.5, Sodium 135, GFR 57 08/22/2020 Creatinine 1.12, BUN 14, Potassium 4.3, Sodium 132, GFR 53 08/21/2020 Creatinine 1.04, BUN 13, Potassium 3.9, Sodium 133, GFR 57  A complete set of results can be found in Results Review.   Recommendations:  Advised to call [facility] office to set up a virtual post hospital visit since she is bedridden.   Follow-up plan: ICM clinic phone appointment on 12/03/2020.   91 day device clinic remote transmission 01/02/2021.     EP/Cardiology Office Visits:   10/12/2020 yearly visit with Dr. Johney Frame canceled and not rescheduled.       Copy of ICM check sent to Dr. Johney Frame.   3 month ICM trend: 10/30/2020.    1 Year ICM trend:       Karie Soda, RN 10/31/2020 8:38 AM

## 2020-12-04 ENCOUNTER — Telehealth: Payer: Medicare PPO | Admitting: Cardiovascular Disease

## 2020-12-05 NOTE — Progress Notes (Signed)
Unable to reach patient for monthly ICM remote follow up.  Patient disenrolled due patient is not actively participating in ICM clinic.  Device clinic 91 day remote monitoring will continue per protocol.  

## 2020-12-17 ENCOUNTER — Ambulatory Visit (INDEPENDENT_AMBULATORY_CARE_PROVIDER_SITE_OTHER): Payer: Medicare PPO

## 2020-12-17 DIAGNOSIS — I428 Other cardiomyopathies: Secondary | ICD-10-CM

## 2020-12-17 LAB — CUP PACEART REMOTE DEVICE CHECK
Battery Remaining Longevity: 66 mo
Battery Remaining Percentage: 73 %
Battery Voltage: 2.96 V
Brady Statistic AP VP Percent: 1 %
Brady Statistic AP VS Percent: 0 %
Brady Statistic AS VP Percent: 99 %
Brady Statistic AS VS Percent: 1 %
Brady Statistic RA Percent Paced: 1 %
Date Time Interrogation Session: 20220813123125
HighPow Impedance: 43 Ohm
HighPow Impedance: 43 Ohm
Implantable Lead Location: 753859
Implantable Lead Location: 753860
Implantable Lead Model: 6947
Lead Channel Impedance Value: 1000 Ohm
Lead Channel Impedance Value: 390 Ohm
Lead Channel Impedance Value: 510 Ohm
Lead Channel Pacing Threshold Amplitude: 0.5 V
Lead Channel Pacing Threshold Amplitude: 0.625 V
Lead Channel Pacing Threshold Amplitude: 1 V
Lead Channel Pacing Threshold Pulse Width: 0.5 ms
Lead Channel Pacing Threshold Pulse Width: 0.5 ms
Lead Channel Pacing Threshold Pulse Width: 0.5 ms
Lead Channel Sensing Intrinsic Amplitude: 3.7 mV
Lead Channel Setting Pacing Amplitude: 1.5 V
Lead Channel Setting Pacing Amplitude: 2 V
Lead Channel Setting Pacing Amplitude: 2 V
Lead Channel Setting Pacing Pulse Width: 0.5 ms
Lead Channel Setting Pacing Pulse Width: 0.5 ms
Lead Channel Setting Sensing Sensitivity: 0.5 mV
Pulse Gen Serial Number: 9895991

## 2020-12-18 ENCOUNTER — Telehealth: Payer: Self-pay

## 2020-12-18 NOTE — Telephone Encounter (Signed)
Left a message regarding virtual visit on 12/19/20.

## 2020-12-19 ENCOUNTER — Telehealth (INDEPENDENT_AMBULATORY_CARE_PROVIDER_SITE_OTHER): Payer: Medicare PPO | Admitting: Internal Medicine

## 2020-12-19 ENCOUNTER — Encounter: Payer: Self-pay | Admitting: Internal Medicine

## 2020-12-19 ENCOUNTER — Other Ambulatory Visit: Payer: Self-pay

## 2020-12-19 VITALS — BP 107/48 | Ht 66.0 in

## 2020-12-19 DIAGNOSIS — I5022 Chronic systolic (congestive) heart failure: Secondary | ICD-10-CM

## 2020-12-19 DIAGNOSIS — I10 Essential (primary) hypertension: Secondary | ICD-10-CM | POA: Diagnosis not present

## 2020-12-19 DIAGNOSIS — I428 Other cardiomyopathies: Secondary | ICD-10-CM

## 2020-12-19 NOTE — Progress Notes (Signed)
Electrophysiology TeleHealth Note  Due to national recommendations of social distancing due to COVID 19, an audio and video telehealth visit is felt to be most appropriate for this patient at this time.  Verbal consent was obtained by me for the telehealth visit today.    Date:  12/19/2020   ID:  Dana Grant, DOB 19-Oct-1950, MRN [id number]  Location: patient's home  Provider location:  [address], [place]  Evaluation Performed: Follow-up visit  PCP:  Pcp, No  Primary Cardiology - previously [facility] Electrophysiologist:  Dr Johney Frame  Chief Complaint:  CHF  History of Present Illness:    Dana Grant is a 70 y.o. female who presents via telehealth video conferencing today.  Since last being seen in our clinic, the patient has not done well.  She has had multiple hospitalizations.  She had back surgery.  She is bed bound and requires stretcher to transfer.  She was recently hospitalized in [place] for infection and "bed soars"  for which she was on antibiotics for 10 days.  She says that she did not have bacteremia.  I do not have records to review. She has done reasonably well since discharge.  Today, she denies symptoms of fevers, chills, chest pain, shortness of breath,  lower extremity edema, dizziness, presyncope, or syncope.  The patient is otherwise without complaint today.     Past Medical History:  Diagnosis Date   AICD (automatic cardioverter/defibrillator) present    Anxiety    Anxiety state 07/18/2019   ICD10 Conversion   Back pain    BBB (bundle branch block)    BMI 39.0-39.9,adult    Breast mass 03/01/1999   CAD (coronary artery disease)    Cardiopathy    CHF (congestive heart failure) (HCC)    Chronic ulcer of other specified site 07/18/2019   Complication of anesthesia    Yes N & V   DM (diabetes mellitus) (HCC)    Dyslipidemia    Essential hypertension 09/02/1999   ICD10 Conversion   Gastroparesis 07/18/2019   History of hiatal  hernia    Hypercholesteremia    Hyperlipidemia, mixed 09/02/1999   Hypertension    Hypothyroidism 07/18/2019   ICD10 Conversion   Left ventricular systolic dysfunction    Major depressive disorder, recurrent episode, moderate (HCC) 07/18/2019   Mastodynia 07/18/2019   Migraine    Myocardial infarction (HCC) 2011   Nonischemic cardiomyopathy (HCC)    Obesity 03/01/1999   ICD10 Conversion   OSA on CPAP    Other dyspnea and respiratory abnormality 07/18/2019   PONV (postoperative nausea and vomiting)    Presence of permanent cardiac pacemaker    Proteinuria 07/18/2019   Spondylosis    Type II or unspecified type diabetes mellitus without mention of complication, not stated as uncontrolled 07/18/2019   Vitamin B12 deficiency     Past Surgical History:  Procedure Laterality Date   ABDOMINAL HYSTERECTOMY     ANTERIOR LAT LUMBAR FUSION N/A 11/03/2019   Procedure: Lumbar Two-Three Lumbar Three-Four Lumbar Four-Five Anterolateral lumbar interbody fusion with percutaneous pedicle screws;  Surgeon: Maeola Harman, MD;  [facility] OR;  Service: Neurosurgery;  Laterality: N/A;  Lumbar Two-Three Lumbar Three-Four Lumbar Four-Five Anterolateral lumbar interbody fusion with percutaneous pedicle screws   BACK SURGERY  2009   CARDIAC DEFIBRILLATOR PLACEMENT  01/08/2011   implanted in [place] ([facility]) BiV ICD   CESAREAN SECTION     CHOLECYSTECTOMY     IMPLANTABLE CARDIOVERTER DEFIBRILLATOR GENERATOR CHANGE  10/21/2018  placed in [place],  [place] model [id number] [phone] [id number]) BiV ICD,  RV lead- [phone] [facility] [id number] V)  implanted by Dr Sabino Donovan   INSERT / REPLACE / REMOVE PACEMAKER  2012   LUMBAR PERCUTANEOUS PEDICLE SCREW 3 LEVEL N/A 11/03/2019   Procedure: LUMBAR PERCUTANEOUS PEDICLE SCREW 3 LEVEL;  Surgeon: Maeola Harman, MD;  Location: [facility] OR;  Service: Neurosurgery;  Laterality: N/A;    Current Outpatient Medications  Medication Sig Dispense Refill   acetaminophen (TYLENOL) 500 MG tablet Take 2  tablets (1,000 mg total) by mouth every 8 (eight) hours. 30 tablet 0   bisacodyl (DULCOLAX) 10 MG suppository Place 1 suppository rectally as needed for constipation.     carvedilol (COREG) 25 MG tablet Take 25 mg by mouth 2 (two) times daily.     cloNIDine (CATAPRES) 0.1 MG tablet Take 0.1 mg by mouth every 6 (six) hours as needed. Elevated BP     cyanocobalamin (,VITAMIN B-12,) 1000 MCG/ML injection Inject 1,000 mcg into the muscle every 14 (fourteen) days.      dapagliflozin propanediol (FARXIGA) 10 MG TABS tablet Take 10 mg by mouth daily.     DULoxetine (CYMBALTA) 30 MG capsule Take 30 mg by mouth in the morning and at bedtime.      ferrous sulfate 325 (65 FE) MG tablet Take 1 tablet by mouth in the morning and at bedtime.     furosemide (LASIX) 20 MG tablet Take 1 tablet by mouth daily.     gabapentin (NEURONTIN) 300 MG capsule Take 2 capsules by mouth in the morning and at bedtime.     insulin NPH-regular Human (70-30) 100 UNIT/ML injection Inject 30 Units into the skin 2 (two) times daily. 10 mL 0   levothyroxine (SYNTHROID) 100 MCG tablet Take 100 mcg by mouth daily before breakfast.     losartan (COZAAR) 50 MG tablet Take 0.5 tablets by mouth daily.     magnesium oxide (MAG-OX) 400 MG tablet Take 1 tablet by mouth in the morning and at bedtime.     Oxycodone HCl 10 MG TABS Take 1 tablet by mouth as needed for pain.     pantoprazole (PROTONIX) 40 MG tablet Take 1 tablet (40 mg total) by mouth daily at 12 noon.     polyethylene glycol (MIRALAX / GLYCOLAX) 17 g packet Take 17 g by mouth daily. 14 each 0   potassium chloride SA (KLOR-CON) 20 MEQ tablet Take 20 mEq by mouth daily.     sucralfate (CARAFATE) 1 g tablet Take 1 g by mouth 4 (four) times daily -  with meals and at bedtime.     vitamin C (ASCORBIC ACID) 500 MG tablet Take 500 mg by mouth 2 (two) times daily.     Vitamin D, Ergocalciferol, (DRISDOL) 1.25 MG (50000 UNIT) CAPS capsule Take 50,000 Units by mouth 2 (two) times a week.      ALPRAZolam (XANAX) 1 MG tablet Take 1 mg by mouth 3 (three) times daily as needed for anxiety. (Patient not taking: Reported on 12/19/2020)     HYDROcodone-acetaminophen (NORCO/VICODIN) 5-325 MG tablet Take 1-2 tablets by mouth every 4 (four) hours as needed (pain). (Patient not taking: Reported on 12/19/2020) 30 tablet 0   No current facility-administered medications for this visit.    Allergies:   Codeine, Sulfa antibiotics, Demerol [meperidine], Lisinopril, Morphine and related, and Penicillins   Social History:  The patient  reports that she has never smoked. She has never used smokeless tobacco. She reports that  she does not drink alcohol and does not use drugs.   ROS:  Please see the history of present illness.   All other systems are personally reviewed and negative.    Exam:    Vital Signs:  BP (!) 107/48   Ht 5\' 6"  (1.676 m)   BMI 38.89 kg/m   Well sounding, alert and conversant  She is laying in a home hospital bed.  She is chronically ill appearing.  NAD  Labs/Other Tests and Data Reviewed:    Recent Labs: 08/29/2020: ALT 26; BUN 26; Creatinine, Ser 1.53; Hemoglobin 11.6; Magnesium 2.1; Platelets 291; Potassium 4.7; Sodium 134   Wt Readings from Last 3 Encounters:  08/22/20 240 lb 15.4 oz (109.3 kg)  11/03/19 241 lb (109.3 kg)  11/01/19 241 lb 4.8 oz (109.5 kg)     Last device remote is reviewed from [employer] PDF which reveals normal device function, no arrhythmias   ASSESSMENT & PLAN:    1.  Chronic systolic dysfunction/ nonischemic CM Clinically stable S/p BiV ICD implant Continue coreg 25mg  BID Overdue for general cardiology follow-up  2. HTN Continue cozaar 25mg  daily   Risks, benefits and potential toxicities for medications prescribed and/or refilled reviewed with patient today.   Follow-up:  with me in a year Will arrange general cardiology follow-up also.  Patient Risk:  after full review of this patients clinical status, I feel that they are at  moderate risk at this time.  Today, I have spent 15 minutes with the patient with telehealth technology discussing arrhythmia management .    11/03/19, MD  12/19/2020 11:03 AM     [facility] [address] [place] [facility] [place] [phone] (office) [phone] (fax)

## 2020-12-20 ENCOUNTER — Telehealth: Payer: Self-pay | Admitting: Internal Medicine

## 2020-12-20 NOTE — Telephone Encounter (Signed)
  Patient Consent for Virtual Visit        Dana Grant has provided verbal consent on 12/20/2020 for a virtual visit (video or telephone).   CONSENT FOR VIRTUAL VISIT FOR:  Dana Grant  By participating in this virtual visit I agree to the following:  I hereby voluntarily request, consent and authorize [facility] and its employed or contracted physicians, physician assistants, nurse practitioners or other licensed health care professionals (the Practitioner), to provide me with telemedicine health care services (the "Services") as deemed necessary by the treating Practitioner. I acknowledge and consent to receive the Services by the Practitioner via telemedicine. I understand that the telemedicine visit will involve communicating with the Practitioner through live audiovisual communication technology and the disclosure of certain medical information by electronic transmission. I acknowledge that I have been given the opportunity to request an in-person assessment or other available alternative prior to the telemedicine visit and am voluntarily participating in the telemedicine visit.  I understand that I have the right to withhold or withdraw my consent to the use of telemedicine in the course of my care at any time, without affecting my right to future care or treatment, and that the Practitioner or I may terminate the telemedicine visit at any time. I understand that I have the right to inspect all information obtained and/or recorded in the course of the telemedicine visit and may receive copies of available information for a reasonable fee.  I understand that some of the potential risks of receiving the Services via telemedicine include:  Delay or interruption in medical evaluation due to technological equipment failure or disruption; Information transmitted may not be sufficient (e.g. poor resolution of images) to allow for appropriate medical decision making by the  Practitioner; and/or  In rare instances, security protocols could fail, causing a breach of personal health information.  Furthermore, I acknowledge that it is my responsibility to provide information about my medical history, conditions and care that is complete and accurate to the best of my ability. I acknowledge that Practitioner's advice, recommendations, and/or decision may be based on factors not within their control, such as incomplete or inaccurate data provided by me or distortions of diagnostic images or specimens that may result from electronic transmissions. I understand that the practice of medicine is not an exact science and that Practitioner makes no warranties or guarantees regarding treatment outcomes. I acknowledge that a copy of this consent can be made available to me via my patient portal [facility] MyChart), or I can request a printed copy by calling the office of [facility].    I understand that my insurance will be billed for this visit.   I have read or had this consent read to me. I understand the contents of this consent, which adequately explains the benefits and risks of the Services being provided via telemedicine.  I have been provided ample opportunity to ask questions regarding this consent and the Services and have had my questions answered to my satisfaction. I give my informed consent for the services to be provided through the use of telemedicine in my medical care

## 2021-01-05 NOTE — Progress Notes (Signed)
Remote ICD transmission.   

## 2021-03-20 ENCOUNTER — Telehealth: Payer: Self-pay | Admitting: Internal Medicine

## 2021-03-20 NOTE — Telephone Encounter (Signed)
Son of patient called. There was a message on his machine that the office did not get a remote transmission from the patient.  The patient has been in a rehab facility since 02/01/21 and is not near her [facility]

## 2021-03-22 NOTE — Telephone Encounter (Signed)
Called patient's husband and advised him to take home monitor to rehab facility since her DC date is hasnt been determined and when he gets monitor there to plug into power and send a transmission by pressing the white button twice to make sure transmissions will go through patient's husband voiced understanding.

## 2021-03-25 NOTE — Progress Notes (Signed)
Erroneous encounter

## 2021-03-26 ENCOUNTER — Encounter: Payer: Medicare PPO | Admitting: Family Medicine

## 2021-03-26 DIAGNOSIS — I10 Essential (primary) hypertension: Secondary | ICD-10-CM

## 2021-03-26 DIAGNOSIS — I428 Other cardiomyopathies: Secondary | ICD-10-CM

## 2021-03-26 DIAGNOSIS — Z95810 Presence of automatic (implantable) cardiac defibrillator: Secondary | ICD-10-CM

## 2021-03-26 DIAGNOSIS — I5022 Chronic systolic (congestive) heart failure: Secondary | ICD-10-CM

## 2021-03-26 NOTE — Progress Notes (Signed)
Virtual Visit via Telephone Note   This visit type was conducted due to national recommendations for restrictions regarding the COVID-19 Pandemic (e.g. social distancing) in an effort to limit this patient's exposure and mitigate transmission in our community.  Due to her co-morbid illnesses, this patient is at least at moderate risk for complications without adequate follow up.  This format is felt to be most appropriate for this patient at this time.  The patient did not have access to video technology/had technical difficulties with video requiring transitioning to audio format only (telephone).  All issues noted in this document were discussed and addressed.  No physical exam could be performed with this format.  Please refer to the patient's chart for her  consent to telehealth for [facility].    Date:  03/27/2021   ID:  Dana Grant, DOB February 08, 1951, MRN [id number] The patient was identified using 2 identifiers.  Patient Location: Home Provider Location: Office/Clinic   PCP:  Pcp, No   [facility] Providers Cardiologist:  None Electrophysiologist:  Thompson Grayer, MD     Evaluation Performed:  Follow-Up Visit  Chief Complaint: 80-month follow-up   History of Present Illness:    Dana Grant is a 70 y.o. female with chronic systolic heart failure, ICD, HTN, nonischemic cardiomyopathy, DM2, obesity, HLD, hypothyroidism.  She was last seen by Dr. Rayann Heman on 12/19/2020 via video visit.  Per Dr. Jackalyn Lombard note she had multiple hospitalizations recently back surgery.  She was bedbound and required transfer to stretcher.  She had recently been hospitalized in [place] for infection and bedsores for which she was on antibiotics.  She was status post BiV ICD implant.  She was continuing Coreg 25 mg p.o. twice daily.  She was continuing Cozaar 25 mg daily for hypertension.   Spoke to the patient by phone today.  She is having a lot of issues with bedsores and having wound  care treatments.  Has issues with back pain and had recent back surgery.  Recent BiV ICD check in August was normal per Dr. Rayann Heman.  She states her diabetes is under reasonable control.  She states she has some upcoming lab work by her primary care provider.  She states her primary care provider is Haze Justin in [place].  He states she will have some upcoming labs with her primary care provider.  Currently denies any anginal or exertional symptoms, orthostatic symptoms, CVA or TIA-like symptoms, palpitations or arrhythmias, DOE, SOB, PND, orthopnea.  Weight today is 195.  She states this is close to her baseline Weight.  She is basically bedbound and unable to ambulate.  The patient does not have symptoms concerning for COVID-19 infection (fever, chills, cough, or new shortness of breath).    Past Medical History:  Diagnosis Date   AICD (automatic cardioverter/defibrillator) present    Anxiety    Anxiety state 07/18/2019   ICD10 Conversion   Back pain    BBB (bundle branch block)    BMI 39.0-39.9,adult    Breast mass 03/01/1999   CAD (coronary artery disease)    Cardiopathy    CHF (congestive heart failure) ([place])    Chronic ulcer of other specified site 0000000   Complication of anesthesia    Yes N & V   DM (diabetes mellitus) ([place])    Dyslipidemia    Essential hypertension 09/02/1999   ICD10 Conversion   Gastroparesis 07/18/2019   History of hiatal hernia    Hypercholesteremia    Hyperlipidemia, mixed 09/02/1999  Hypertension    Hypothyroidism 07/18/2019   ICD10 Conversion   Left ventricular systolic dysfunction    Major depressive disorder, recurrent episode, moderate ([place]) 07/18/2019   Mastodynia 07/18/2019   Migraine    Myocardial infarction [facility]) 2011   Nonischemic cardiomyopathy ([place])    Obesity 03/01/1999   ICD10 Conversion   OSA on CPAP    Other dyspnea and respiratory abnormality 07/18/2019   PONV (postoperative nausea and vomiting)    Presence of permanent  cardiac pacemaker    Proteinuria 07/18/2019   Spondylosis    Type II or unspecified type diabetes mellitus without mention of complication, not stated as uncontrolled 07/18/2019   Vitamin B12 deficiency    Past Surgical History:  Procedure Laterality Date   ABDOMINAL HYSTERECTOMY     ANTERIOR LAT LUMBAR FUSION N/A 11/03/2019   Procedure: Lumbar Two-Three Lumbar Three-Four Lumbar Four-Five Anterolateral lumbar interbody fusion with percutaneous pedicle screws;  Surgeon: Erline Levine, MD;  Location: [place];  Service: Neurosurgery;  Laterality: N/A;  Lumbar Two-Three Lumbar Three-Four Lumbar Four-Five Anterolateral lumbar interbody fusion with percutaneous pedicle screws   BACK SURGERY  2009   CARDIAC DEFIBRILLATOR PLACEMENT  01/08/2011   implanted in [place] ([facility]) BiV ICD   CESAREAN SECTION     CHOLECYSTECTOMY     IMPLANTABLE CARDIOVERTER DEFIBRILLATOR GENERATOR CHANGE  10/21/2018   placed in [place],  [place] [id number] ([place] [id number]) BiV ICD,  RV lead- [phone] (SN [id number])  implanted by Dr Joylene John   INSERT / REPLACE / REMOVE PACEMAKER  2012   LUMBAR PERCUTANEOUS PEDICLE SCREW 3 LEVEL N/A 11/03/2019   Procedure: LUMBAR PERCUTANEOUS PEDICLE SCREW 3 LEVEL;  Surgeon: Erline Levine, MD;  Location: [place];  Service: Neurosurgery;  Laterality: N/A;     No outpatient medications have been marked as taking for the 03/27/21 encounter (Office Visit) with Verta Ellen., NP.     Allergies:   Codeine, Sulfa antibiotics, Demerol [meperidine], Lisinopril, Morphine and related, and Penicillins   Social History   Tobacco Use   Smoking status: Never   Smokeless tobacco: Never  Substance Use Topics   Alcohol use: Never   Drug use: Never     Family Hx: The patient's family history includes CAD in her mother.  ROS:   Please see the history of present illness.    All other systems reviewed and are negative.   Prior CV studies:   The following studies were reviewed today:  Remote  device check 12/17/2020 Dr. Rayann Heman Remote device check reviewed. Normal device function. Battery status, leads stable. Histograms reviewed and appropriate.  Routine follow-up  Labs/Other Tests and Data Reviewed:    EKG:  EKG August 30, 2020 atrial sensed ventricular paced rhythm rate of 85.  Recent Labs: 08/29/2020: ALT 26; BUN 26; Creatinine, Ser 1.53; Hemoglobin 11.6; Magnesium 2.1; Platelets 291; Potassium 4.7; Sodium 134   Recent Lipid Panel No results found for: CHOL, TRIG, HDL, CHOLHDL, LDLCALC, LDLDIRECT  Wt Readings from Last 3 Encounters:  03/27/21 195 lb (88.5 kg)  08/22/20 240 lb 15.4 oz (109.3 kg)  11/03/19 241 lb (109.3 kg)     Risk Assessment/Calculations:          Objective:    Vital Signs:  BP 139/81   Pulse 93   Temp 98.7 F (37.1 C)   Resp 20   Ht 5\' 6"  (1.676 m)   Wt 195 lb (88.5 kg)   SpO2 92%   BMI 31.47 kg/m    VITAL  SIGNS:  reviewed GEN:    ASSESSMENT & PLAN:    1. Essential hypertension BP 139/81.  Patient states her blood pressure is usually is in the 130s over 80s range.  Continue clonidine 0.1 mg every 6 hours as needed for elevated blood pressure.  Continue losartan 25 mg p.o. daily.  Continue Coreg 25 mg p.o. twice daily  2. ICD (implantable cardioverter-defibrillator) in place Recent ICD remote check by Dr. Johney Frame was functioning normally.  3. Nonischemic cardiomyopathy (HCC) Denies any DOE, SOB, PND, orthopnea, weight gain.  Continue Coreg 25 mg p.o. twice daily continue Lasix 20 mg p.o. daily, continue losartan 25 mg p.o. daily.  Continue potassium supplementation 20 mEq daily     COVID-19 Education: The signs and symptoms of COVID-19 were discussed with the patient and how to seek care for testing (follow up with PCP or arrange E-visit).  The importance of social distancing was discussed today.  Time:   Today, I have spent 10 minutes minutes with the patient with telehealth technology discussing the above problems.      Medication Adjustments/Labs and Tests Ordered: Current medicines are reviewed at length with the patient today.  Concerns regarding medicines are outlined above.   Tests Ordered: No orders of the defined types were placed in this encounter.   Medication Changes: No orders of the defined types were placed in this encounter.   Follow Up:  Virtual Visit  in 6 month(s) Dr. Wyline Mood  Signed, Netta Neat, NP  03/27/2021 12:29 PM    [facility]

## 2021-03-27 ENCOUNTER — Encounter: Payer: Self-pay | Admitting: Family Medicine

## 2021-03-27 ENCOUNTER — Ambulatory Visit (INDEPENDENT_AMBULATORY_CARE_PROVIDER_SITE_OTHER): Payer: Medicare PPO | Admitting: Family Medicine

## 2021-03-27 VITALS — BP 139/81 | HR 93 | Temp 98.7°F | Resp 20 | Ht 66.0 in | Wt 195.0 lb

## 2021-03-27 DIAGNOSIS — I428 Other cardiomyopathies: Secondary | ICD-10-CM | POA: Diagnosis not present

## 2021-03-27 DIAGNOSIS — Z95810 Presence of automatic (implantable) cardiac defibrillator: Secondary | ICD-10-CM | POA: Diagnosis not present

## 2021-03-27 DIAGNOSIS — I10 Essential (primary) hypertension: Secondary | ICD-10-CM

## 2021-03-27 NOTE — Patient Instructions (Signed)

## 2021-04-03 NOTE — Progress Notes (Signed)
This encounter was created in error - please disregard.

## 2021-04-04 ENCOUNTER — Ambulatory Visit (INDEPENDENT_AMBULATORY_CARE_PROVIDER_SITE_OTHER): Payer: Medicare PPO

## 2021-04-04 DIAGNOSIS — I428 Other cardiomyopathies: Secondary | ICD-10-CM | POA: Diagnosis not present

## 2021-04-04 LAB — CUP PACEART REMOTE DEVICE CHECK
Battery Remaining Longevity: 61 mo
Battery Remaining Percentage: 69 %
Battery Voltage: 2.96 V
Brady Statistic AP VP Percent: 1 %
Brady Statistic AP VS Percent: 0 %
Brady Statistic AS VP Percent: 99 %
Brady Statistic AS VS Percent: 1 %
Brady Statistic RA Percent Paced: 1 %
Date Time Interrogation Session: 20221201123819
HighPow Impedance: 37 Ohm
HighPow Impedance: 38 Ohm
Implantable Lead Location: 753859
Implantable Lead Location: 753860
Implantable Lead Model: 6947
Lead Channel Impedance Value: 340 Ohm
Lead Channel Impedance Value: 440 Ohm
Lead Channel Impedance Value: 930 Ohm
Lead Channel Pacing Threshold Amplitude: 0.5 V
Lead Channel Pacing Threshold Amplitude: 0.625 V
Lead Channel Pacing Threshold Amplitude: 0.875 V
Lead Channel Pacing Threshold Pulse Width: 0.5 ms
Lead Channel Pacing Threshold Pulse Width: 0.5 ms
Lead Channel Pacing Threshold Pulse Width: 0.5 ms
Lead Channel Sensing Intrinsic Amplitude: 1.8 mV
Lead Channel Sensing Intrinsic Amplitude: 8.3 mV
Lead Channel Setting Pacing Amplitude: 1.5 V
Lead Channel Setting Pacing Amplitude: 2 V
Lead Channel Setting Pacing Amplitude: 2 V
Lead Channel Setting Pacing Pulse Width: 0.5 ms
Lead Channel Setting Pacing Pulse Width: 0.5 ms
Lead Channel Setting Sensing Sensitivity: 0.5 mV
Pulse Gen Serial Number: 9895991

## 2021-04-16 NOTE — Progress Notes (Signed)
Remote ICD transmission.   

## 2021-06-11 IMAGING — CR DG HIP (WITH OR WITHOUT PELVIS) 3-4V BILAT
5 series · 5 of 5 positions shown · non-contrast
Comparison: None.

CLINICAL DATA: Pain after trauma.

EXAM:
DG HIP (WITH OR WITHOUT PELVIS) 3-4V BILAT

[hip ap (1 of 2)]
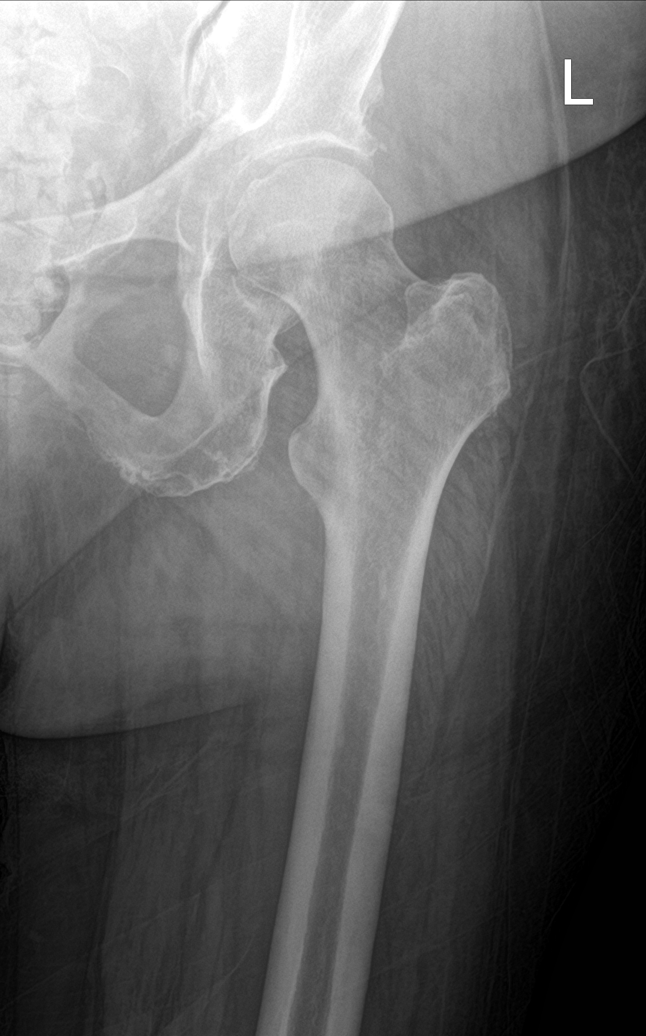

[hip ap (2 of 2)]
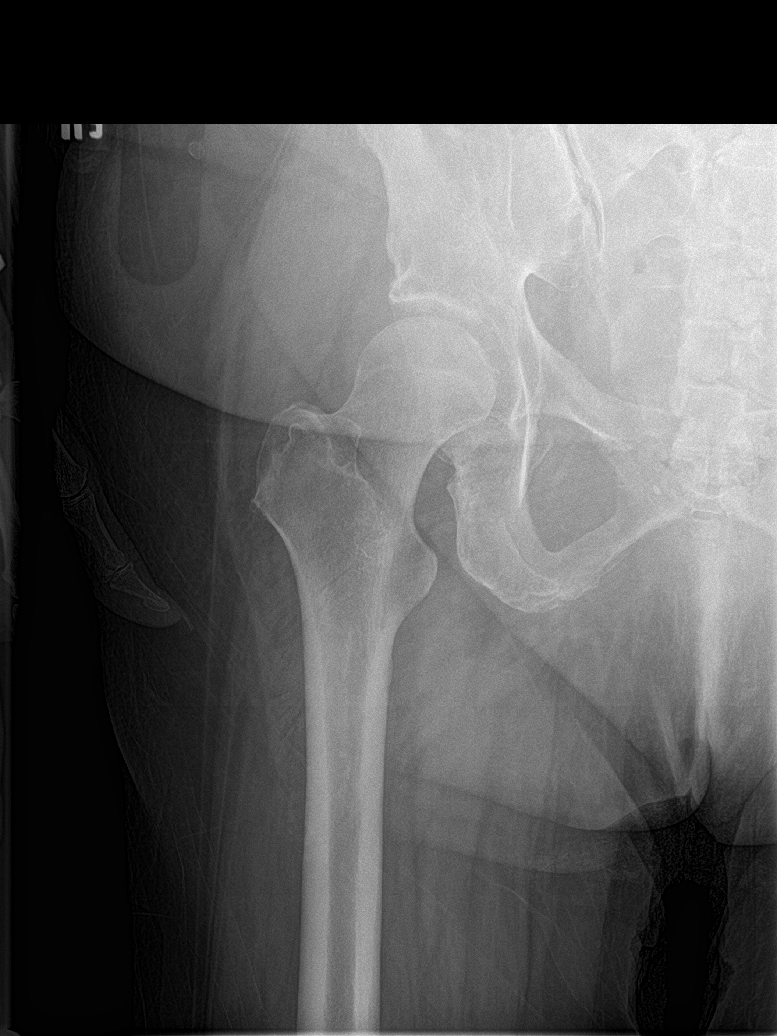

[hip lat (1 of 2)]
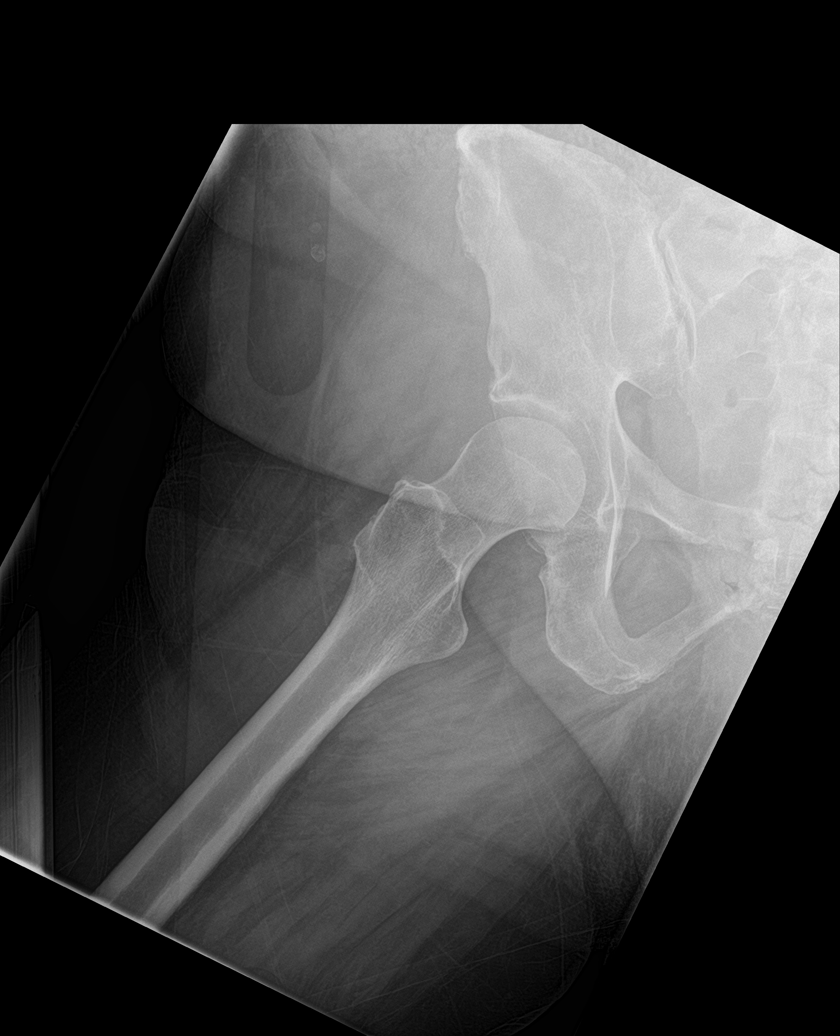

[hip lat (2 of 2)]
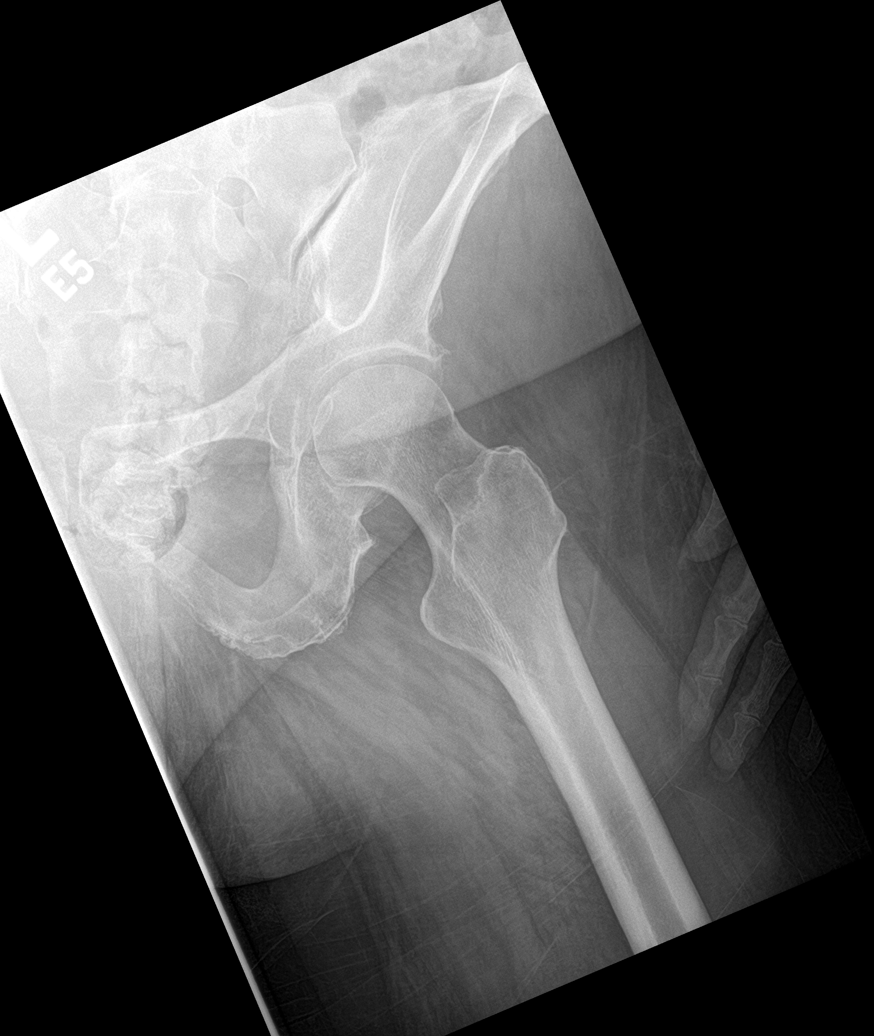

[pelvis ap]
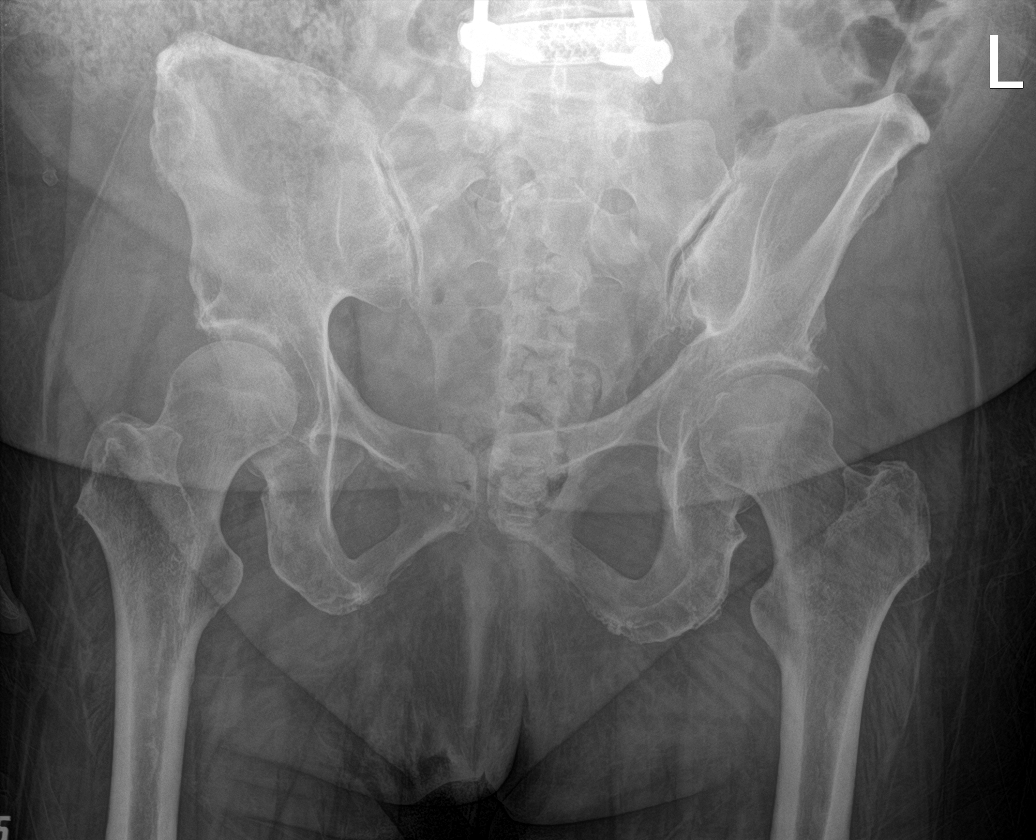

[5 of 5 positions shown; findings below may reference images not displayed]

FINDINGS: AP view of the pelvis and AP/frog leg views of both hips.
Incompletely imaged lumbar spine fixation. Left greater than right
sacroiliac joint sclerosis which is likely degenerative.

No acute fracture or dislocation.
IMPRESSION: No acute osseous abnormality.

## 2021-06-11 IMAGING — CT CT L SPINE W/O CM
3 of 6 series · 13 of 34 positions shown, 15 images · non-contrast
Comparison: Lumbar spine radiographs 08/01/2020 and earlier. Lumbar
CT myelogram 07/18/2019.

CLINICAL DATA: Fall today.  Low back and bilateral hip pain.

EXAM:
CT LUMBAR SPINE WITHOUT CONTRAST
TECHNIQUE: Multidetector CT imaging of the lumbar spine was performed without
intravenous contrast administration. Multiplanar CT image
reconstructions were also generated.

[Series 6: sag bone · sagittal · 0.43mm/px · 6 of 145 slices shown]
[im 25/145  bone]
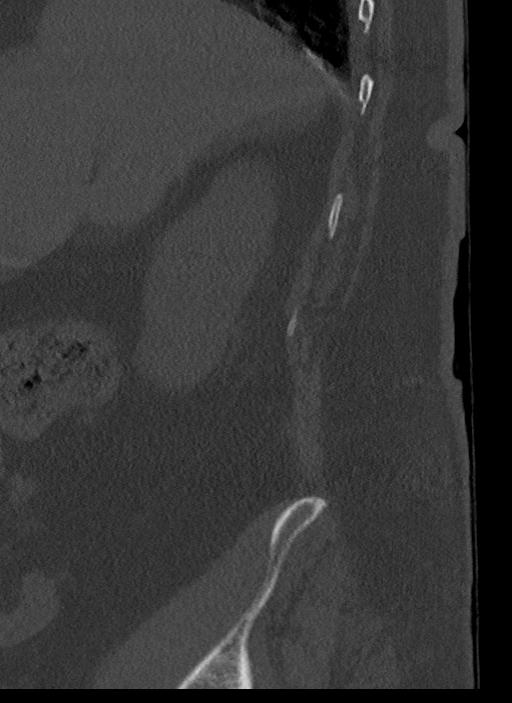
[im 49/145  bone]
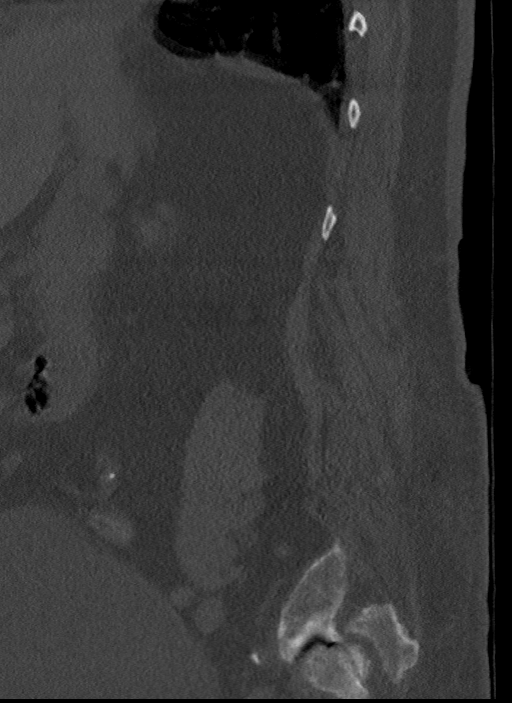
[im 73/145  bone]
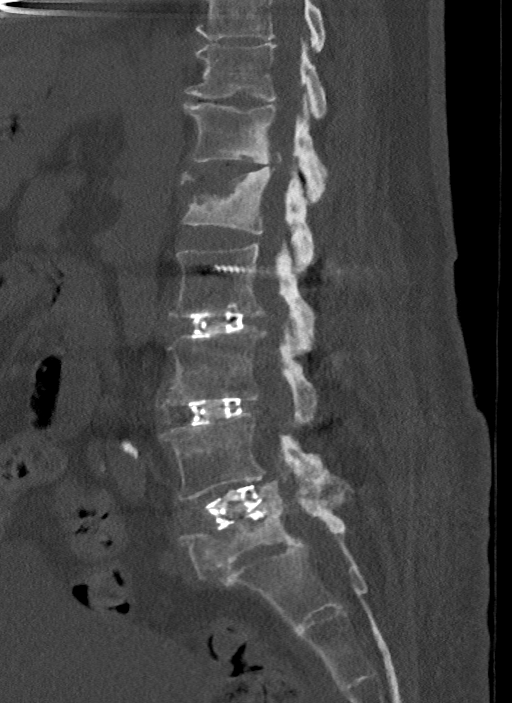
[im 97/145  bone]
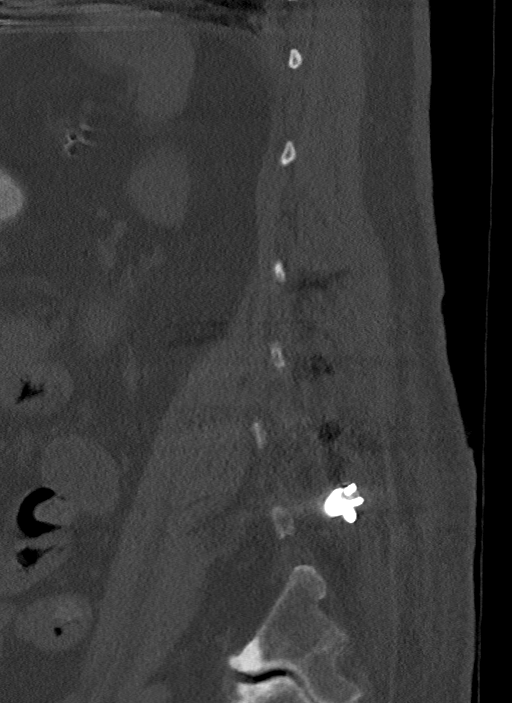
[im 121/145  bone]
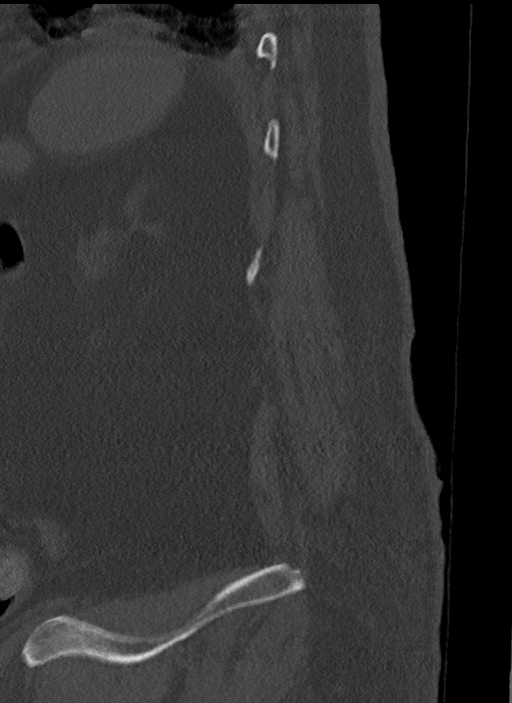
[im 126/145  soft-tissue]
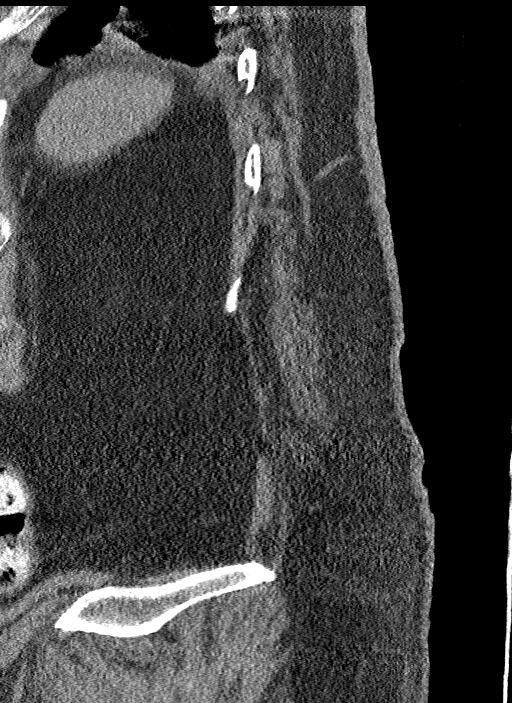

[Series 8: cor st · coronal · 0.57mm/px · 2 of 169 slices shown]
[im 57/169  bone]
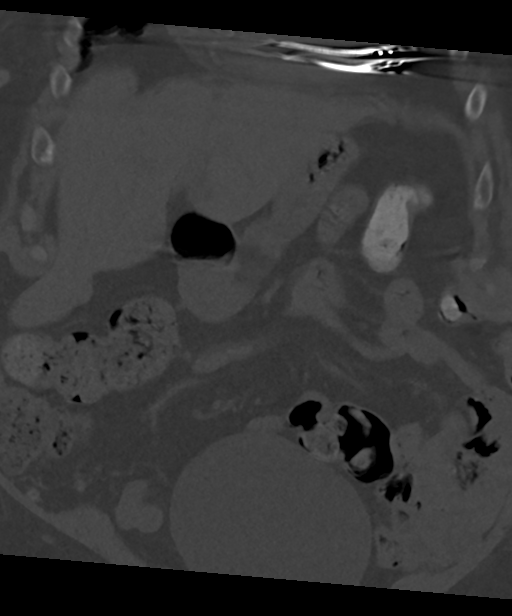
[im 113/169  bone]
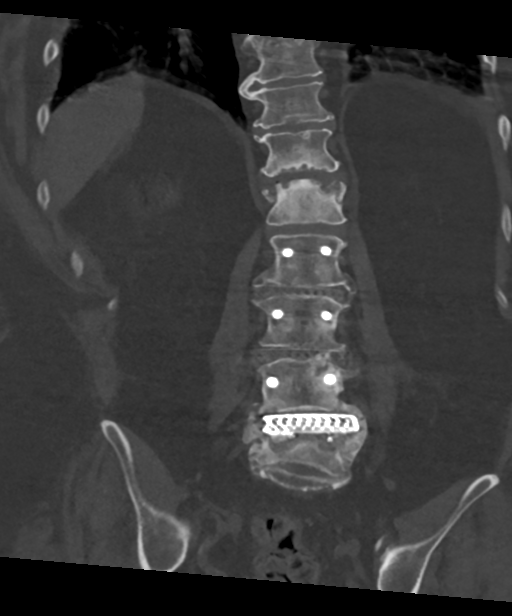

[Series 10: orthogonal · axial · 0.21mm/px · z∈[-415,-212]mm · 5 of 149 slices shown, 7 images]
[im 25/149  soft-tissue]
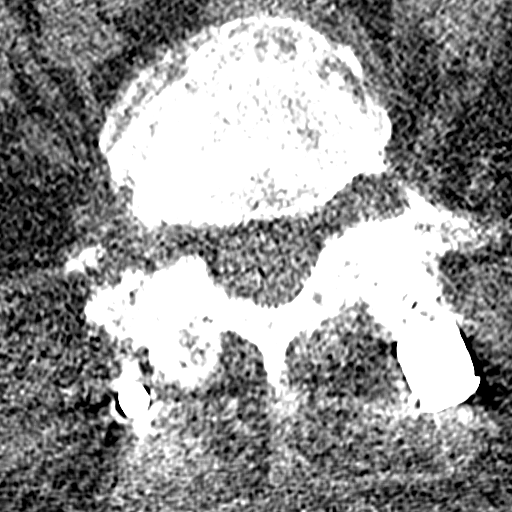
[im 25/149  bone]
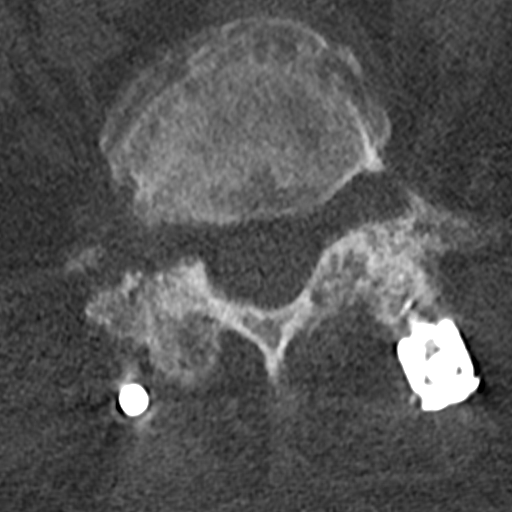
[im 50/149  bone]
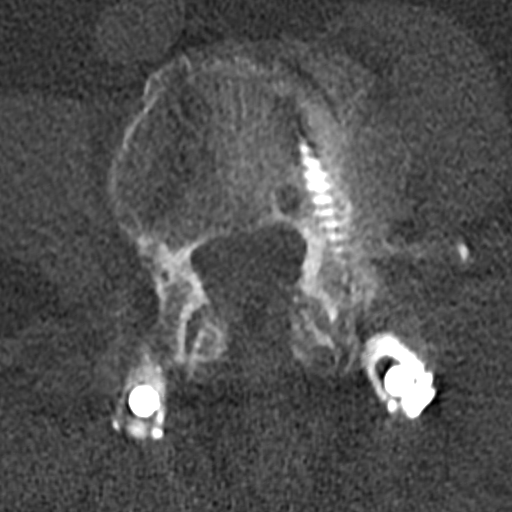
[im 75/149  bone]
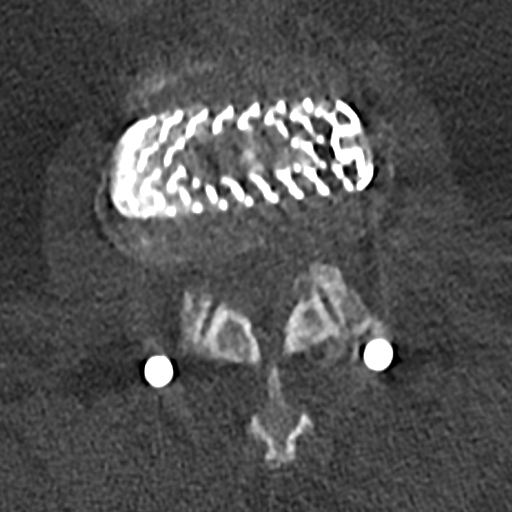
[im 99/149  bone]
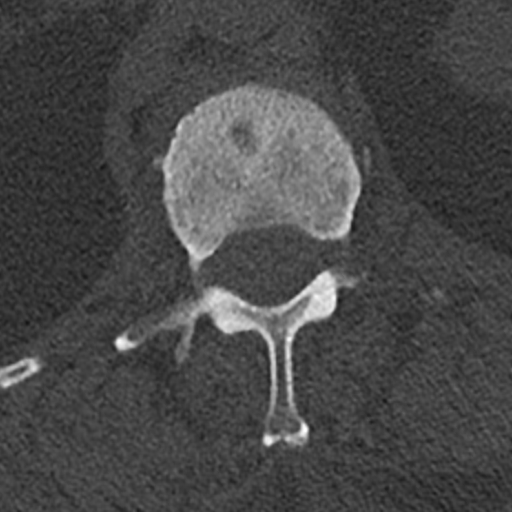
[im 124/149  soft-tissue]
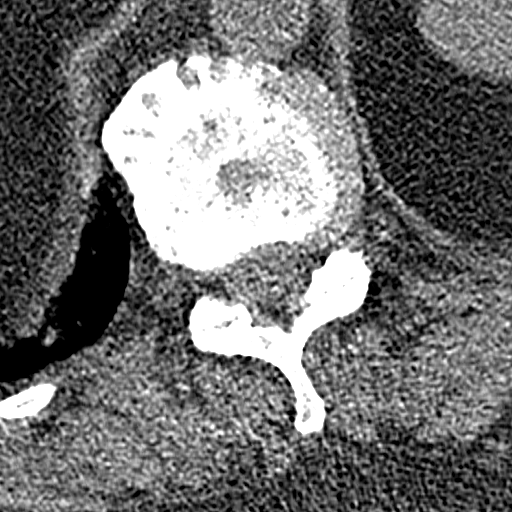
[im 124/149  bone]
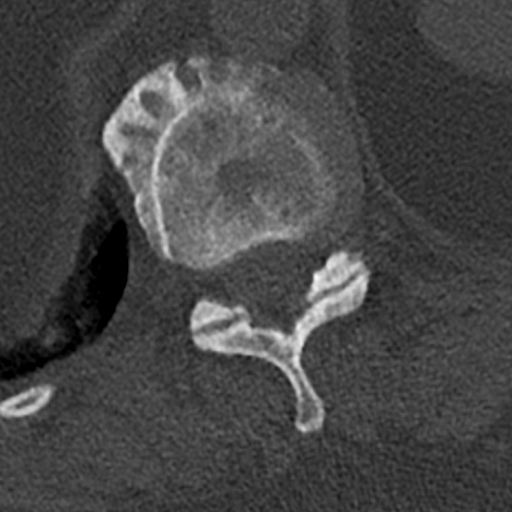

[13 of 34 positions shown; findings below may reference images not displayed]

FINDINGS: Segmentation: 5 lumbar type vertebrae.

Alignment: 6 mm anterolisthesis of L4 on L5.

Vertebrae: Displaced fracture or involving the superior and anterior
aspects of the L1 vertebral body with 55% anterior vertebral body
height loss, new from the [DATE] CT but chronic in appearance and
present on multiple interval radiographs including 12/05/2019.
Displaced, healed fracture of the L5 vertebral body, also new from
[DATE] but present on interval radiographs. L2-L5 posterior and
interbody fusion with subsidence of the interbody spacer at L4-5 in
the setting of the chronic L5 vertebral fracture. Minimal lucency
surrounding both L5 pedicle screws. Slight traversal of the lateral
recess by the right L5 screw. Solid interbody osseous fusion at L2-3
and L3-4 without solid interbody fusion at L4-5. Solid
posterolateral osseous fusion at each of the operative levels.
Chronic interbody and facet ankylosis at L5-S1. No acute fracture or
suspicious osseous lesion.

Paraspinal and other soft tissues: Hiatal hernia. Abdominal aortic
atherosclerosis without aneurysm. Atelectasis and possible trace
pleural effusion in the left lung base.

Disc levels:

T10-11: Disc bulging, endplate spurring, and mild facet arthrosis
result in mild bilateral neural foraminal stenosis without evidence
of significant spinal stenosis.

T11-12: Disc bulging and mild-to-moderate left facet arthrosis
without evidence of significant stenosis.

T12-L1: Disc bulging with a large, broad posterior osteophyte or
calcified disc herniation and moderate right and severe left facet
arthrosis result in moderate spinal stenosis, left greater than
right lateral recess stenosis, and moderate to severe bilateral
neural foraminal stenosis, progressed from the prior CT.

L1-2: Mild disc bulging and mild facet and ligamentum flavum
hypertrophy without evidence of significant stenosis.

L2-3: Interval fusion with improved spinal canal patency compared to
the prior CT. Moderate left greater than right neural foraminal
stenosis due to disc bulging, endplate spurring, and asymmetric left
facet hypertrophy.

L3-4: Interval fusion with improved spinal canal patency compared to
the prior CT. Moderate left neural foraminal stenosis due to disc
bulging, endplate spurring, and asymmetric left facet hypertrophy.

L4-5: Fusion with limited assessment of the spinal canal due to
streak artifact. Moderate to severe bilateral neural foraminal
stenosis due to disc bulging, endplate spurring, and facet
hypertrophy.

L5-S1: Chronic interbody and facet ankylosis. No evidence of
significant spinal stenosis. Mild to moderate osseous neural
foraminal stenosis bilaterally.
IMPRESSION: 1. No acute osseous abnormality identified in the lumbar spine.
2. Chronic fractures of the L1 and L5 vertebral bodies.
3. L2-L5 posterior and interbody fusion with residual neural
foraminal stenosis as above.
4. Moderate spinal stenosis and moderate to severe bilateral neural
foraminal stenosis at T12-L1, progressed from [DATE].
5. Aortic Atherosclerosis ([id number]).

## 2021-06-11 IMAGING — CT CT PELVIS W/O CM
4 of 6 series · 15 of 36 positions shown, 17 images · non-contrast
Comparison: None.

CLINICAL DATA: Bilateral hip pain after fall.

EXAM:
CT PELVIS WITHOUT CONTRAST
TECHNIQUE: Multidetector CT imaging of the pelvis was performed following the
standard protocol without intravenous contrast.

[Series 3: soft tissue · axial · 0.72mm/px · z∈[-669,-509]mm · 5 of 49 slices shown, 7 images (1 of 2)]
[im 9/49  soft-tissue]
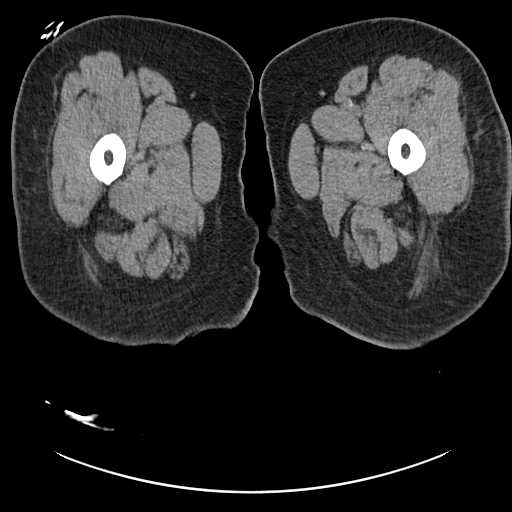
[im 9/49  bone]
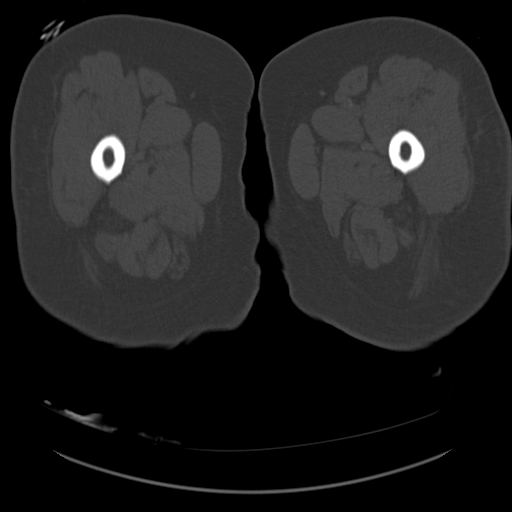
[im 17/49  bone]
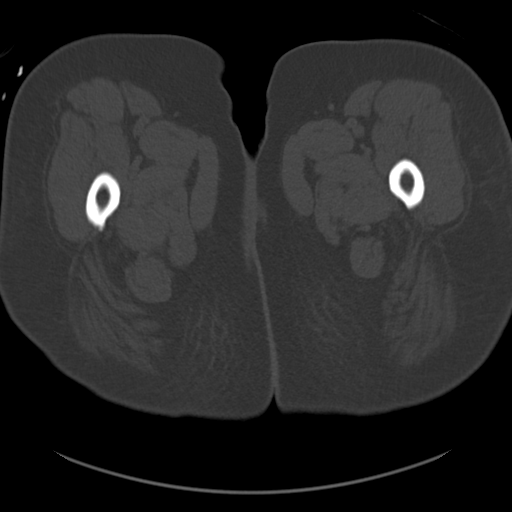
[im 25/49  bone]
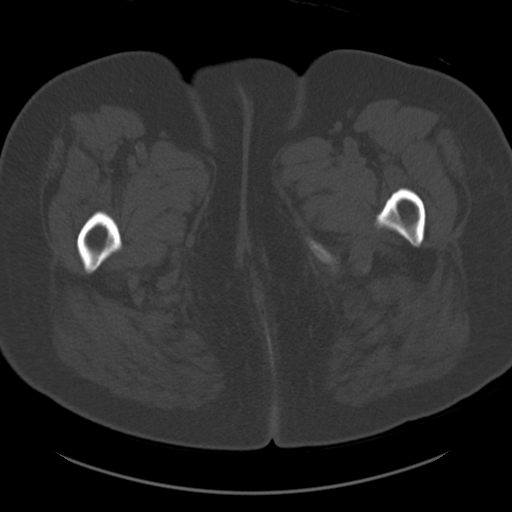
[im 33/49  bone]
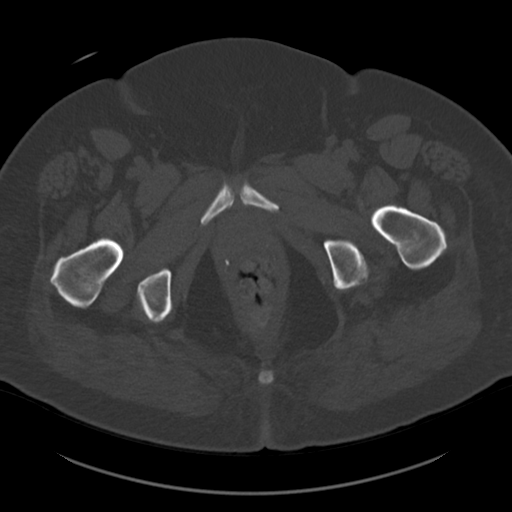
[im 41/49  soft-tissue]
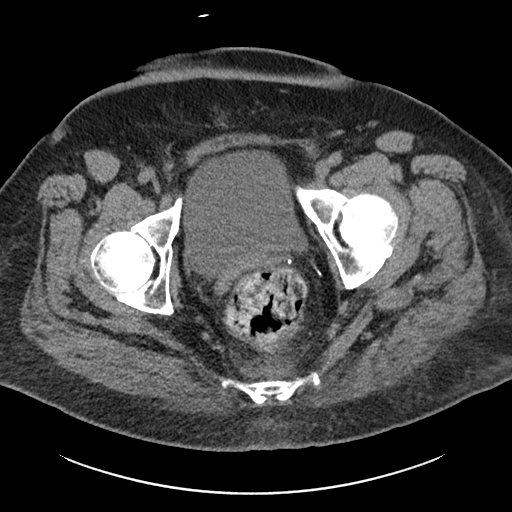
[im 41/49  bone]
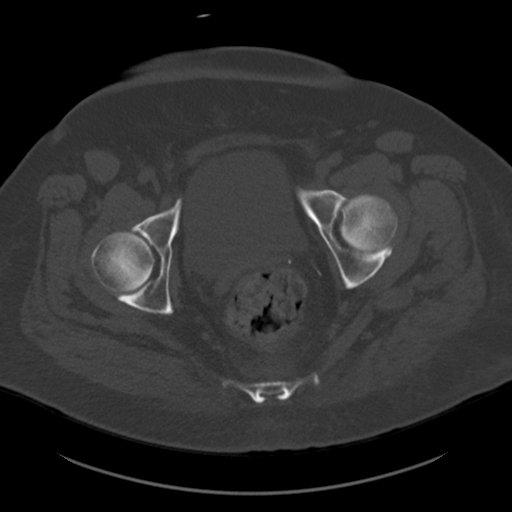

[Series 4: soft tissue · axial · 0.72mm/px · z∈[-553,-473]mm · 3 of 49 slices shown (2 of 2)]
[im 9/49  soft-tissue]
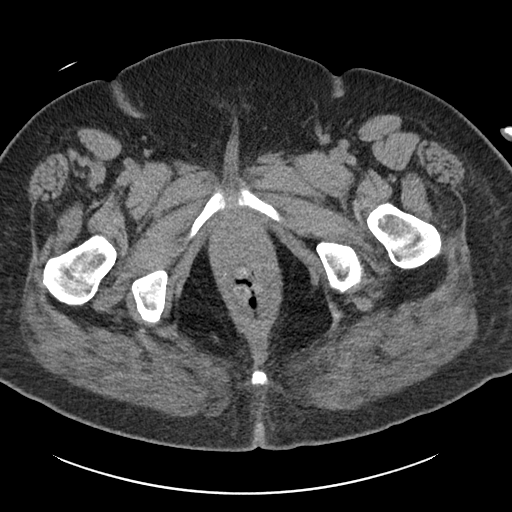
[im 17/49  soft-tissue]
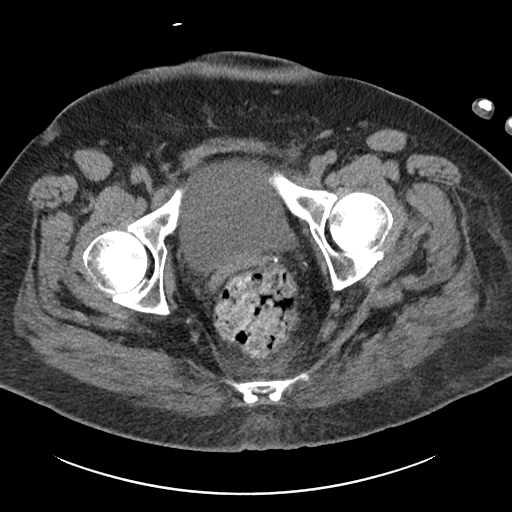
[im 25/49  soft-tissue]
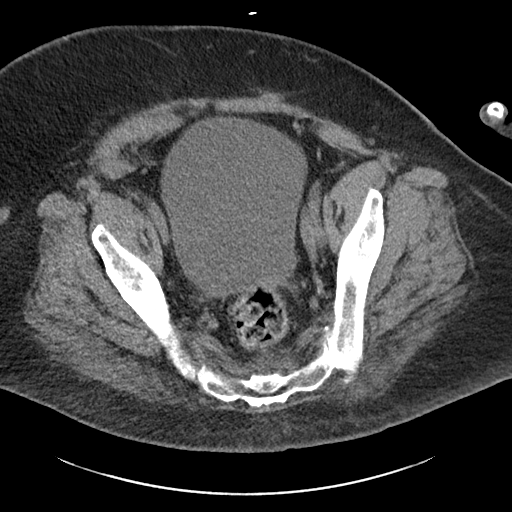

[Series 6: cor st · coronal · 0.49mm/px · 1 of 168 slices shown]
[im 84/168  bone]
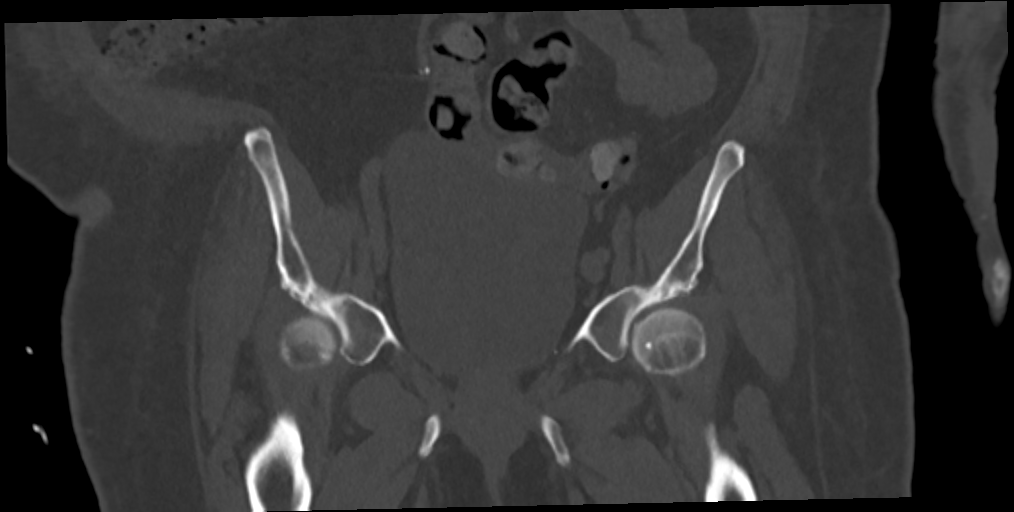

[Series 7: sag st · sagittal · 0.49mm/px · 6 of 250 slices shown]
[im 13/250  soft-tissue]
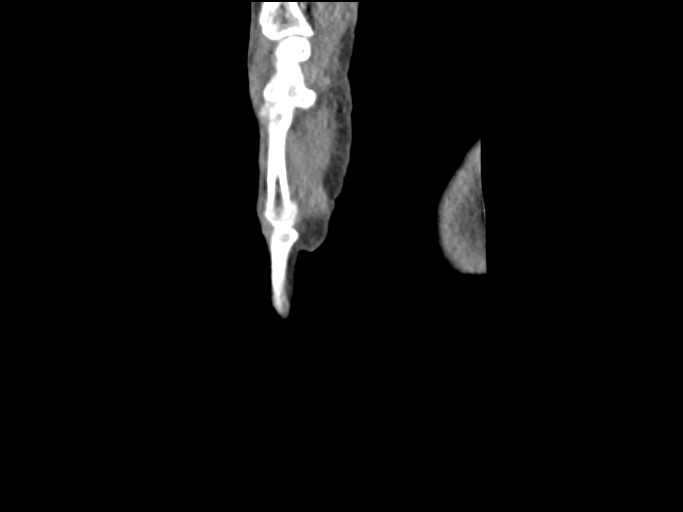
[im 42/250  bone]
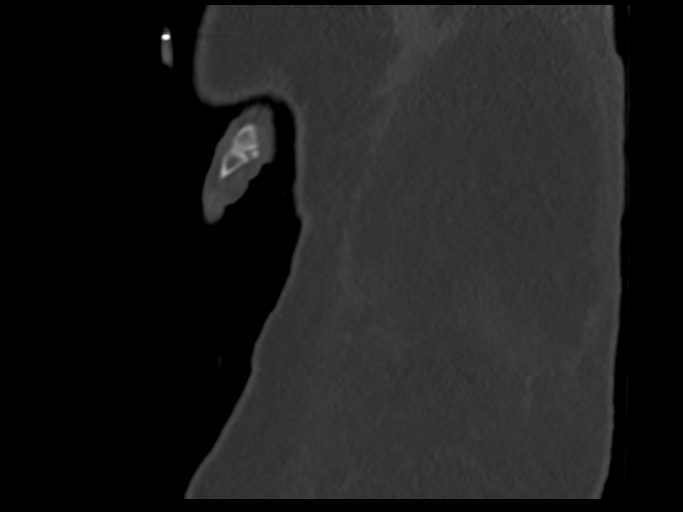
[im 84/250  bone]
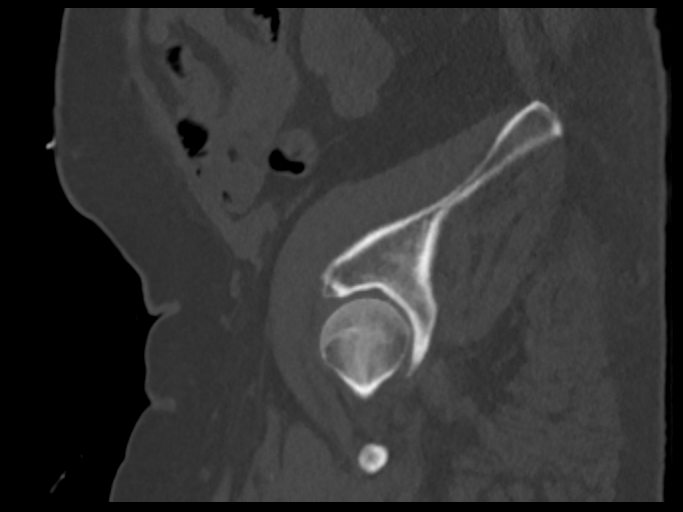
[im 125/250  bone]
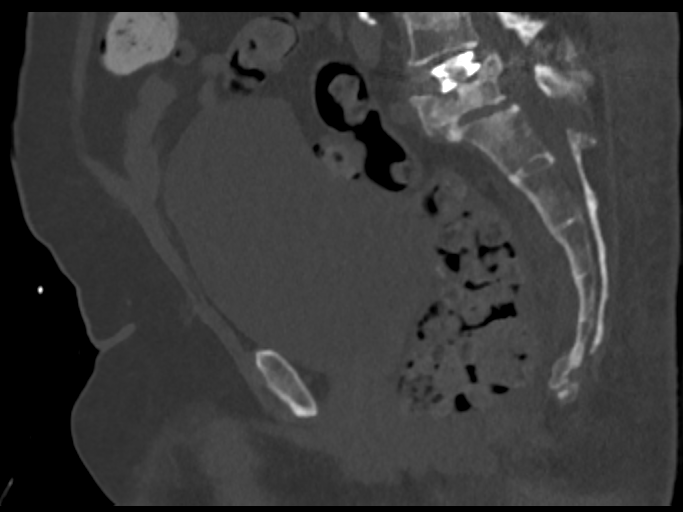
[im 167/250  bone]
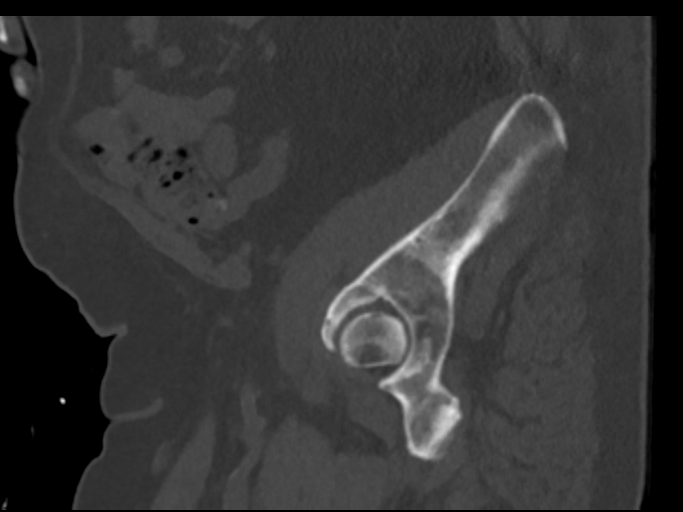
[im 208/250  bone]
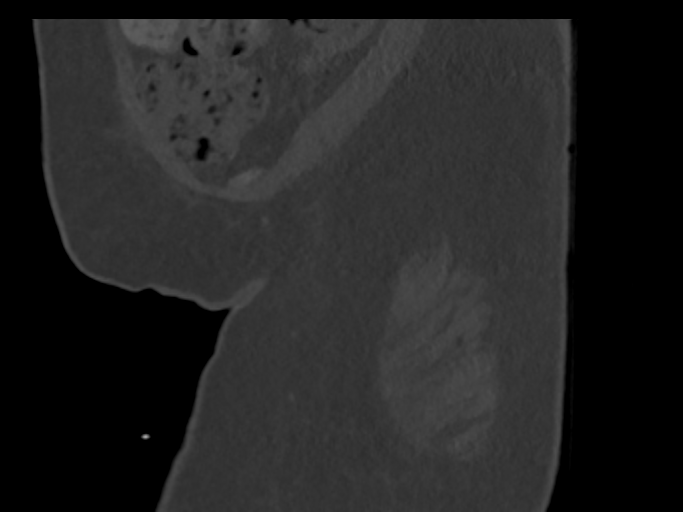

[15 of 36 positions shown; findings below may reference images not displayed]

FINDINGS: Urinary Tract:  No abnormality visualized.

Bowel:  Unremarkable visualized pelvic bowel loops.

Vascular/Lymphatic: No pathologically enlarged lymph nodes. No
significant vascular abnormality seen.

Reproductive: Status post hysterectomy. No adnexal abnormality is
noted.

Other:  None.

Musculoskeletal: No suspicious bone lesions identified. No fracture
is noted.
IMPRESSION: No fracture or other significant bony abnormality is noted.

## 2021-07-04 ENCOUNTER — Ambulatory Visit (INDEPENDENT_AMBULATORY_CARE_PROVIDER_SITE_OTHER): Payer: Medicare PPO

## 2021-07-04 DIAGNOSIS — I428 Other cardiomyopathies: Secondary | ICD-10-CM | POA: Diagnosis not present

## 2021-07-05 LAB — CUP PACEART REMOTE DEVICE CHECK
Battery Remaining Longevity: 60 mo
Battery Remaining Percentage: 66 %
Battery Voltage: 2.95 V
Brady Statistic AP VP Percent: 1 %
Brady Statistic AP VS Percent: 0 %
Brady Statistic AS VP Percent: 99 %
Brady Statistic AS VS Percent: 1 %
Brady Statistic RA Percent Paced: 1 %
Date Time Interrogation Session: 20230303103909
HighPow Impedance: 47 Ohm
HighPow Impedance: 47 Ohm
Implantable Lead Location: 753859
Implantable Lead Location: 753860
Implantable Lead Model: 6947
Lead Channel Impedance Value: 1075 Ohm
Lead Channel Impedance Value: 440 Ohm
Lead Channel Impedance Value: 560 Ohm
Lead Channel Pacing Threshold Amplitude: 0.5 V
Lead Channel Pacing Threshold Amplitude: 0.5 V
Lead Channel Pacing Threshold Amplitude: 0.875 V
Lead Channel Pacing Threshold Pulse Width: 0.5 ms
Lead Channel Pacing Threshold Pulse Width: 0.5 ms
Lead Channel Pacing Threshold Pulse Width: 0.5 ms
Lead Channel Sensing Intrinsic Amplitude: 3.7 mV
Lead Channel Sensing Intrinsic Amplitude: 8.3 mV
Lead Channel Setting Pacing Amplitude: 1.5 V
Lead Channel Setting Pacing Amplitude: 2 V
Lead Channel Setting Pacing Amplitude: 2 V
Lead Channel Setting Pacing Pulse Width: 0.5 ms
Lead Channel Setting Pacing Pulse Width: 0.5 ms
Lead Channel Setting Sensing Sensitivity: 0.5 mV
Pulse Gen Serial Number: 9895991

## 2021-07-11 NOTE — Progress Notes (Signed)
Remote ICD transmission.   

## 2021-10-03 ENCOUNTER — Ambulatory Visit (INDEPENDENT_AMBULATORY_CARE_PROVIDER_SITE_OTHER): Payer: Medicare PPO

## 2021-10-03 DIAGNOSIS — I428 Other cardiomyopathies: Secondary | ICD-10-CM

## 2021-10-03 LAB — CUP PACEART REMOTE DEVICE CHECK
Battery Remaining Longevity: 58 mo
Battery Remaining Percentage: 64 %
Battery Voltage: 2.95 V
Brady Statistic AP VP Percent: 1 %
Brady Statistic AP VS Percent: 0 %
Brady Statistic AS VP Percent: 99 %
Brady Statistic AS VS Percent: 1 %
Brady Statistic RA Percent Paced: 1 %
Date Time Interrogation Session: 20230601041316
HighPow Impedance: 48 Ohm
HighPow Impedance: 48 Ohm
Implantable Lead Location: 753859
Implantable Lead Location: 753860
Implantable Lead Model: 6947
Lead Channel Impedance Value: 430 Ohm
Lead Channel Impedance Value: 590 Ohm
Lead Channel Impedance Value: 980 Ohm
Lead Channel Pacing Threshold Amplitude: 0.5 V
Lead Channel Pacing Threshold Amplitude: 0.5 V
Lead Channel Pacing Threshold Amplitude: 0.875 V
Lead Channel Pacing Threshold Pulse Width: 0.5 ms
Lead Channel Pacing Threshold Pulse Width: 0.5 ms
Lead Channel Pacing Threshold Pulse Width: 0.5 ms
Lead Channel Sensing Intrinsic Amplitude: 2.9 mV
Lead Channel Sensing Intrinsic Amplitude: 8.3 mV
Lead Channel Setting Pacing Amplitude: 1.5 V
Lead Channel Setting Pacing Amplitude: 2 V
Lead Channel Setting Pacing Amplitude: 2 V
Lead Channel Setting Pacing Pulse Width: 0.5 ms
Lead Channel Setting Pacing Pulse Width: 0.5 ms
Lead Channel Setting Sensing Sensitivity: 0.5 mV
Pulse Gen Serial Number: 9895991

## 2021-10-08 ENCOUNTER — Telehealth: Payer: Self-pay | Admitting: Family Medicine

## 2021-10-08 ENCOUNTER — Telehealth: Payer: Self-pay | Admitting: Physician Assistant

## 2021-10-08 NOTE — Telephone Encounter (Signed)
  Patient Consent for Virtual Visit        Dana Grant has provided verbal consent on 10/08/2021 for a virtual visit (video or telephone).   CONSENT FOR VIRTUAL VISIT FOR:  Dana Grant  By participating in this virtual visit I agree to the following:  I hereby voluntarily request, consent and authorize [facility] and its employed or contracted physicians, physician assistants, nurse practitioners or other licensed health care professionals (the Practitioner), to provide me with telemedicine health care services (the "Services") as deemed necessary by the treating Practitioner. I acknowledge and consent to receive the Services by the Practitioner via telemedicine. I understand that the telemedicine visit will involve communicating with the Practitioner through live audiovisual communication technology and the disclosure of certain medical information by electronic transmission. I acknowledge that I have been given the opportunity to request an in-person assessment or other available alternative prior to the telemedicine visit and am voluntarily participating in the telemedicine visit.  I understand that I have the right to withhold or withdraw my consent to the use of telemedicine in the course of my care at any time, without affecting my right to future care or treatment, and that the Practitioner or I may terminate the telemedicine visit at any time. I understand that I have the right to inspect all information obtained and/or recorded in the course of the telemedicine visit and may receive copies of available information for a reasonable fee.  I understand that some of the potential risks of receiving the Services via telemedicine include:  Delay or interruption in medical evaluation due to technological equipment failure or disruption; Information transmitted may not be sufficient (e.g. poor resolution of images) to allow for appropriate medical decision making by the Practitioner;  and/or  In rare instances, security protocols could fail, causing a breach of personal health information.  Furthermore, I acknowledge that it is my responsibility to provide information about my medical history, conditions and care that is complete and accurate to the best of my ability. I acknowledge that Practitioner's advice, recommendations, and/or decision may be based on factors not within their control, such as incomplete or inaccurate data provided by me or distortions of diagnostic images or specimens that may result from electronic transmissions. I understand that the practice of medicine is not an exact science and that Practitioner makes no warranties or guarantees regarding treatment outcomes. I acknowledge that a copy of this consent can be made available to me via my patient portal [facility] MyChart), or I can request a printed copy by calling the office of [facility].    I understand that my insurance will be billed for this visit.   I have read or had this consent read to me. I understand the contents of this consent, which adequately explains the benefits and risks of the Services being provided via telemedicine.  I have been provided ample opportunity to ask questions regarding this consent and the Services and have had my questions answered to my satisfaction. I give my informed consent for the services to be provided through the use of telemedicine in my medical care

## 2021-10-08 NOTE — Telephone Encounter (Signed)
Pt calling to schedule a Virtual Annual f/u visit. Per recall and letter that pt received she is able to have a Telephone Virtual appt. Pt was a Dana Grant pt. Please advise

## 2021-10-11 NOTE — Progress Notes (Signed)
Remote ICD transmission.   

## 2021-12-06 ENCOUNTER — Encounter: Payer: Self-pay | Admitting: Internal Medicine

## 2021-12-06 ENCOUNTER — Ambulatory Visit: Payer: Medicare PPO | Admitting: Internal Medicine

## 2021-12-06 VITALS — BP 110/70 | HR 88 | Ht 66.0 in | Wt 220.0 lb

## 2021-12-06 DIAGNOSIS — I428 Other cardiomyopathies: Secondary | ICD-10-CM | POA: Diagnosis not present

## 2021-12-06 DIAGNOSIS — I10 Essential (primary) hypertension: Secondary | ICD-10-CM

## 2021-12-06 DIAGNOSIS — I5022 Chronic systolic (congestive) heart failure: Secondary | ICD-10-CM | POA: Diagnosis not present

## 2021-12-06 NOTE — Patient Instructions (Signed)
Medication Instructions:  Continue all current medications.  Labwork: none  Testing/Procedures: none  Follow-Up: 1 year   Any Other Special Instructions Will Be Listed Below (If Applicable).  If you need a refill on your cardiac medications before your next appointment, please call your pharmacy.  

## 2021-12-06 NOTE — Progress Notes (Signed)
PCP: Vick Frees, MD   Primary EP: Dr Johney Frame  Dana Grant is a 71 y.o. female who presents today for routine electrophysiology followup.  Since last being seen in our clinic, the patient reports doing reasonably well.  She has made much improvement since I saw her last but continues to be unable to walk.  She is in a wheelchair today and making slow progress.  She is participating regularly in PT.   Today, she denies symptoms of palpitations, chest pain, shortness of breath,  lower extremity edema, dizziness, presyncope, syncope, or ICD shocks.  The patient is otherwise without complaint today.   Past Medical History:  Diagnosis Date   AICD (automatic cardioverter/defibrillator) present    Anxiety    Anxiety state 07/18/2019   ICD10 Conversion   Back pain    BBB (bundle branch block)    BMI 39.0-39.9,adult    Breast mass 03/01/1999   CAD (coronary artery disease)    Cardiopathy    CHF (congestive heart failure) (HCC)    Chronic ulcer of other specified site 07/18/2019   Complication of anesthesia    Yes N & V   DM (diabetes mellitus) (HCC)    Dyslipidemia    Essential hypertension 09/02/1999   ICD10 Conversion   Gastroparesis 07/18/2019   History of hiatal hernia    Hypercholesteremia    Hyperlipidemia, mixed 09/02/1999   Hypertension    Hypothyroidism 07/18/2019   ICD10 Conversion   Left ventricular systolic dysfunction    Major depressive disorder, recurrent episode, moderate (HCC) 07/18/2019   Mastodynia 07/18/2019   Migraine    Myocardial infarction (HCC) 2011   Nonischemic cardiomyopathy (HCC)    Obesity 03/01/1999   ICD10 Conversion   OSA on CPAP    Other dyspnea and respiratory abnormality 07/18/2019   PONV (postoperative nausea and vomiting)    Presence of permanent cardiac pacemaker    Proteinuria 07/18/2019   Spondylosis    Type II or unspecified type diabetes mellitus without mention of complication, not stated as uncontrolled 07/18/2019   Vitamin B12  deficiency    Past Surgical History:  Procedure Laterality Date   ABDOMINAL HYSTERECTOMY     ANTERIOR LAT LUMBAR FUSION N/A 11/03/2019   Procedure: Lumbar Two-Three Lumbar Three-Four Lumbar Four-Five Anterolateral lumbar interbody fusion with percutaneous pedicle screws;  Surgeon: Maeola Harman, MD;  Location: [facility] OR;  Service: Neurosurgery;  Laterality: N/A;  Lumbar Two-Three Lumbar Three-Four Lumbar Four-Five Anterolateral lumbar interbody fusion with percutaneous pedicle screws   BACK SURGERY  2009   CARDIAC DEFIBRILLATOR PLACEMENT  01/08/2011   implanted in [place] ([facility]) BiV ICD   CESAREAN SECTION     CHOLECYSTECTOMY     IMPLANTABLE CARDIOVERTER DEFIBRILLATOR GENERATOR CHANGE  10/21/2018   placed in [place],  [place] Jude model [id number] ([place] [id number]) BiV ICD,  RV lead- [phone] (SN [id number])  implanted by Dr Sabino Donovan   INSERT / REPLACE / REMOVE PACEMAKER  2012   LUMBAR PERCUTANEOUS PEDICLE SCREW 3 LEVEL N/A 11/03/2019   Procedure: LUMBAR PERCUTANEOUS PEDICLE SCREW 3 LEVEL;  Surgeon: Maeola Harman, MD;  Location: [facility] OR;  Service: Neurosurgery;  Laterality: N/A;    ROS- all systems are reviewed and negative except as per HPI above  Current Outpatient Medications  Medication Sig Dispense Refill   acetaminophen (TYLENOL) 500 MG tablet Take 2 tablets (1,000 mg total) by mouth every 8 (eight) hours. 30 tablet 0   atorvastatin (LIPITOR) 20 MG tablet Take 20 mg by mouth daily.  bisacodyl (DULCOLAX) 10 MG suppository Place 1 suppository rectally as needed for constipation.     carvedilol (COREG) 25 MG tablet Take 12.5 mg by mouth 2 (two) times daily.     cyanocobalamin (,VITAMIN B-12,) 1000 MCG/ML injection Inject 1,000 mcg into the muscle every 14 (fourteen) days.      DULoxetine (CYMBALTA) 30 MG capsule Take 30 mg by mouth in the morning and at bedtime.      ferrous sulfate 325 (65 FE) MG tablet Take 1 tablet by mouth in the morning and at bedtime.     furosemide (LASIX) 20 MG tablet Take 1  tablet by mouth daily.     gabapentin (NEURONTIN) 400 MG capsule Take 400 mg by mouth 3 (three) times daily.     insulin NPH-regular Human (70-30) 100 UNIT/ML injection Inject 30 Units into the skin 2 (two) times daily. (Patient taking differently: Inject 40 Units into the skin 2 (two) times daily.) 10 mL 0   levothyroxine (SYNTHROID) 100 MCG tablet Take 100 mcg by mouth daily before breakfast.     linaclotide (LINZESS) 290 MCG CAPS capsule Take 290 mcg by mouth daily before breakfast.     methocarbamol (ROBAXIN) 500 MG tablet Take 500 mg by mouth 2 (two) times daily.     metoCLOPramide (REGLAN) 5 MG tablet Take 5 mg by mouth 4 (four) times daily.     NOVOLOG MIX 70/30 FLEXPEN (70-30) 100 UNIT/ML FlexPen Inject 40 Units into the skin 2 (two) times daily.     ondansetron (ZOFRAN) 4 MG tablet Take 4 mg by mouth every 6 (six) hours as needed for nausea or vomiting.     pantoprazole (PROTONIX) 40 MG tablet Take 1 tablet (40 mg total) by mouth daily at 12 noon.     polyethylene glycol (MIRALAX / GLYCOLAX) 17 g packet Take 17 g by mouth daily. 14 each 0   potassium chloride (KLOR-CON M) 10 MEQ tablet Take 10 mEq by mouth daily.     Vitamin D, Ergocalciferol, (DRISDOL) 1.25 MG (50000 UNIT) CAPS capsule Take 50,000 Units by mouth every 7 (seven) days.     No current facility-administered medications for this visit.    Physical Exam: Vitals:   12/06/21 1101  BP: 110/70  Pulse: 88  SpO2: 94%  Weight: 220 lb (99.8 kg)  Height: 5\' 6"  (1.676 m)    GEN- The patient is well appearing, alert and oriented x 3 today.   Head- normocephalic, atraumatic Eyes-  Sclera clear, conjunctiva pink Ears- hearing intact Oropharynx- clear Lungs- Clear to ausculation bilaterally, normal work of breathing Chest- ICD pocket is well healed Heart- Regular rate and rhythm, no murmurs, rubs or gallops, PMI not laterally displaced GI- soft, NT, ND, + BS Extremities- no clubbing, cyanosis, or edema  ICD interrogation-  reviewed in detail today,  See PACEART report    Wt Readings from Last 3 Encounters:  12/06/21 220 lb (99.8 kg)  03/27/21 195 lb (88.5 kg)  08/22/20 240 lb 15.4 oz (109.3 kg)    Assessment and Plan:  1.  Chronic systolic dysfunction/ nonischemic CM euvolemic today Stable on an appropriate medical regimen Normal BiV ICD function See [facility] report No changes today she is not device dependant today not followed in ICM device clinic She is very interested in enrollment at this time.  2. HTN Stable No change required today  Return in a year  08/24/20 MD, [facility] 12/06/2021 11:24 AM

## 2021-12-18 ENCOUNTER — Telehealth: Payer: Self-pay

## 2021-12-18 NOTE — Telephone Encounter (Signed)
-----   Message from Hillis Range, MD sent at 12/06/2021 12:54 PM EDT ----- Please enroll in ICM device clinic She is interested.

## 2021-12-18 NOTE — Telephone Encounter (Signed)
Referred to ICM clinic by Dr Johney Frame.  Spoke with patient and ICM intro given.  Patient is agreeable to ICM monthly follow up.  Advised transmission will send automatically between 12 Midnight and 6:00 AM if monitor is by bedside.  Currently monitor is in another room but she can move it beside her bed.  Explained will call with results after transmission is reviewed.  Explained a Remote Home Transmission will be seen as daytime appointment on office summary visit sheet but the time is not relevant since all transmissions are sent at night time so there is no obligation to stay by the monitor at that time appointed time during the day.  Provided ICM direct number and explained should call if experiencing any fluid symptoms such as weight gain, shortness of breath or extremity/abdominal swelling.  1st ICM remote transmission scheduled for 01/20/2022.

## 2021-12-26 ENCOUNTER — Encounter: Payer: Self-pay | Admitting: Physician Assistant

## 2021-12-26 NOTE — Progress Notes (Signed)
Virtual Visit via Telephone Note   Because of Dana Grant's co-morbid illnesses, she is at least at moderate risk for complications without adequate follow up. The patient declined to have in-person evaluation and specifically requested telephone-only visit. The patient did not have access to video technology/had technical difficulties with video requiring transitioning to audio format only (telephone).  All issues noted in this document were discussed and addressed.  No physical exam could be performed with this format.  Please refer to the patient's chart for her consent to telehealth for [facility].    Date:  12/27/2021   ID:  Dana Grant, DOB [birth year], MRN [id number] The patient was identified using 2 identifiers.  Patient Location: Home Provider Location: Office/Clinic   PCP:  Julian Hy, MD   [facility] Cardiologist:  Remotely Dr. Bronson Ing  Electrophysiologist:  Thompson Grayer, MD     Evaluation Performed:  Follow-Up Visit  Chief Complaint:  f/u CHF  History of Present Illness:    Dana Grant is a 71 y.o. female with reported h/o NICM/chronic systolic CHF s/p ICD, HTN, DM2, obesity, HL, hypothyroidism who presents for follow-up cardiology evaluation.  She established care with Dr. Bronson Ing who reported she was a poor historian and unable to provide many details. Per limited records she had a nuclear stress test 2012 which showed moderately dilated LV with moderate-severely reduced EF, fixed anteroseptal defect ith impaired wall motion either consistent with prior infarction and scar, high grade ischemia, or could be due to underlying LBBB, EF reported at "__ to 30% [sic]" per report. The patient denies ever having a cath. She has a history of CRT-D implant in [place] around ?2012. A report scanned in from 2020 as a catheterization is actually a device change-out, referencing that a CRT chamber ICD was extracted and a Ford was inserted. This is now followed by Dr. Rayann Heman whose last note 12/2021 reported normal BIV ICD function. The only echo available presently is from [facility] from 11/2020 showing EF 60-65%, normal LV size, G1DD, equivocal LV filling pressure, mild aortic valve sclerosis. She does not carry a history of ischemic heart disease or arrhythmia. During an admission 08/2020 for medical issues had mild bump in hs-troponin to 25 felt not clinically significant. She was admitted to outside hospital 10/2021 with ESBL E Coli c/b AKI. Creatinine normalized at discharge. She is described as being bedbound in prior notes.  She is seen virtually for follow-up today, declined in office visit. She has trouble maneuvering about in transportation. She confirms she can only get around by wheelchair. She reports she has been doing very well from cardiac standpoint without any recent chest pain, shortness of breath, edema, palpitations, orthopnea, or ICD shocks. Her BP was running high this morning which has not been the case in prior visits, along with blood sugar of 491. She otherwise feels well today. Recheck BP while on the phone showed a value of 135/81 with pulse 91. Her HR is similarly described as 80s-90s in the past. Overall she is pleased with how she is doing from a cardiac standpoint. Weighing can be challenging due to balance issues, have been very variable in the past.  Prior labs reviewed: 10/2021 Hgb 11.6, plt wnl, K 3.9, Cr 1.03, Mg 2.2, TSH wnl, ferritin up, iron down, TIBC down, albumin 2.8, alk phos 163, AST/ALT OK  Past Medical History:  Diagnosis Date   AICD (automatic cardioverter/defibrillator) present    Anxiety  Anxiety state 07/18/2019   ICD10 Conversion   Back pain    BBB (bundle branch block)    BMI 39.0-39.9,adult    Breast mass 03/01/1999   CAD (coronary artery disease)    Cardiopathy    CHF (congestive heart failure) (HCC)    Chronic ulcer of other specified site  88/91/6945   Complication of anesthesia    Yes N & V   DM (diabetes mellitus) ([place])    Dyslipidemia    Essential hypertension 09/02/1999   ICD10 Conversion   Gastroparesis 07/18/2019   History of hiatal hernia    Hyperlipidemia, mixed 09/02/1999   Hypertension    Hypothyroidism 07/18/2019   ICD10 Conversion   Left ventricular systolic dysfunction    Major depressive disorder, recurrent episode, moderate ([place]) 07/18/2019   Mastodynia 07/18/2019   Migraine    Myocardial infarction ([place]) 2011   Nonischemic cardiomyopathy ([place])    Obesity 03/01/1999   ICD10 Conversion   OSA on CPAP    Other dyspnea and respiratory abnormality 07/18/2019   PONV (postoperative nausea and vomiting)    Proteinuria 07/18/2019   Spondylosis    Type II or unspecified type diabetes mellitus without mention of complication, not stated as uncontrolled 07/18/2019   Vitamin B12 deficiency    Past Surgical History:  Procedure Laterality Date   ABDOMINAL HYSTERECTOMY     ANTERIOR LAT LUMBAR FUSION N/A 11/03/2019   Procedure: Lumbar Two-Three Lumbar Three-Four Lumbar Four-Five Anterolateral lumbar interbody fusion with percutaneous pedicle screws;  Surgeon: Erline Levine, MD;  Location: [place];  Service: Neurosurgery;  Laterality: N/A;  Lumbar Two-Three Lumbar Three-Four Lumbar Four-Five Anterolateral lumbar interbody fusion with percutaneous pedicle screws   BACK SURGERY  2009   CARDIAC DEFIBRILLATOR PLACEMENT  01/08/2011   implanted in [place] ([facility]) BiV ICD   CESAREAN SECTION     CHOLECYSTECTOMY     IMPLANTABLE CARDIOVERTER DEFIBRILLATOR GENERATOR CHANGE  10/21/2018   placed in [place],  [place] [id number] ([place] [id number]) BiV ICD,  RV lead- [phone] (SN [id number])  implanted by Dr Joylene John   INSERT / REPLACE / REMOVE PACEMAKER  2012   LUMBAR PERCUTANEOUS PEDICLE SCREW 3 LEVEL N/A 11/03/2019   Procedure: LUMBAR PERCUTANEOUS PEDICLE SCREW 3 LEVEL;  Surgeon: Erline Levine, MD;  Location: [place];  Service:  Neurosurgery;  Laterality: N/A;     Current Meds  Medication Sig   acetaminophen (TYLENOL) 500 MG tablet Take 2 tablets (1,000 mg total) by mouth every 8 (eight) hours.   atorvastatin (LIPITOR) 20 MG tablet Take 20 mg by mouth daily.   bisacodyl (DULCOLAX) 10 MG suppository Place 1 suppository rectally as needed for constipation.   carvedilol (COREG) 25 MG tablet Take 12.5 mg by mouth 2 (two) times daily.   cyanocobalamin (,VITAMIN B-12,) 1000 MCG/ML injection Inject 1,000 mcg into the muscle every 14 (fourteen) days.    DULoxetine (CYMBALTA) 30 MG capsule Take 30 mg by mouth in the morning and at bedtime.    ferrous sulfate 325 (65 FE) MG tablet Take 1 tablet by mouth in the morning and at bedtime.   furosemide (LASIX) 20 MG tablet Take 1 tablet by mouth daily.   gabapentin (NEURONTIN) 400 MG capsule Take 400 mg by mouth 3 (three) times daily.   insulin NPH-regular Human (70-30) 100 UNIT/ML injection Inject 30 Units into the skin 2 (two) times daily. (Patient taking differently: Inject 40 Units into the skin 2 (two) times daily.)   levothyroxine (SYNTHROID) 100 MCG tablet Take 100 mcg  by mouth daily before breakfast.   linaclotide (LINZESS) 290 MCG CAPS capsule Take 290 mcg by mouth daily before breakfast.   methocarbamol (ROBAXIN) 500 MG tablet Take 500 mg by mouth 2 (two) times daily.   metoCLOPramide (REGLAN) 5 MG tablet Take 5 mg by mouth 4 (four) times daily.   NOVOLOG MIX 70/30 FLEXPEN (70-30) 100 UNIT/ML FlexPen Inject 40 Units into the skin 2 (two) times daily.   ondansetron (ZOFRAN) 4 MG tablet Take 4 mg by mouth every 6 (six) hours as needed for nausea or vomiting.   pantoprazole (PROTONIX) 40 MG tablet Take 1 tablet (40 mg total) by mouth daily at 12 noon.   polyethylene glycol (MIRALAX / GLYCOLAX) 17 g packet Take 17 g by mouth daily.   potassium chloride (KLOR-CON M) 10 MEQ tablet Take 10 mEq by mouth daily.   Vitamin D, Ergocalciferol, (DRISDOL) 1.25 MG (50000 UNIT) CAPS  capsule Take 50,000 Units by mouth every 7 (seven) days.     Allergies:   Codeine, Sulfa antibiotics, Demerol [meperidine], Lisinopril, Morphine and related, and Penicillins   Social History   Tobacco Use   Smoking status: Never   Smokeless tobacco: Never  Vaping Use   Vaping Use: Never used  Substance Use Topics   Alcohol use: Never   Drug use: Never     Family Hx: The patient's family history includes CAD in her mother.  ROS:   Please see the history of present illness.    All other systems reviewed and are negative.   Prior CV studies:   The following studies were reviewed today:  Nuclear stress test 2012 showed moderately dilated LV with moderate-severely reduced EF, fixed anteroseptal defect ith impaired wall motion either consistent with prior infarction and scar, high grade ischemia, or could be due to underlying LBBB.  Cath report scanned from 2020 appears to be device implantation only.  [facility] Echo 11/2020 CareEverywhere  Summary   1. Overall left ventricular ejection fraction is estimated at 60 to 65%.   2. Normal global left ventricular systolic function.   3. (Grade 1) Mildly abnormal left ventricular diastolic filling.   4. No intracardiac thrombi, mass or vegetations.   Left Ventricle:  Normal left ventricular size and wall thicknesses, with normal systolic and diastolic function. Overall left ventricular ejection fraction is estimated at 60 to 65%. The left ventricular internal cavity size was normal. LV septal wall thickness was normal. LV posterior wall thickness is normal. Global LV systolic function was normal. Spectral Doppler shows Grade 1 (Mild) pattern of LV diastolic filling. Tissue Doppler indicates an equivocal left ventricular filling pressure.   LV Wall Scoring:  All segments are normal.    Right Ventricle:  Normal right ventricular size, wall thickness, and systolic function.   Left Atrium:  The left atrium is normal in size.   Right  Atrium:  The right atrium is normal in size. The inferior vena cava measures 1.75 cm.   Pericardium:  There is no evidence of pericardial effusion.   Mitral Valve:  Structurally normal mitral valve, with normal leaflet excursion; without any evidence of mitral stenosis. The mitral valve is normal in structure. No evidence of mitral valve regurgitation is seen.   Tricuspid Valve:  The tricuspid valve is normal in structure. No tricuspid regurgitation is visualized. The tricuspid regurgitant velocity is 2.41 m/s, and with an assumed right atrial pressure of 10 mmHg, the estimated right ventricular systolic pressure is normal at 33.2 mmHg.   Aortic Valve:  The aortic valve appears trileaflet. No degree of aortic stenosis is present. There is mild aortic valve sclerosis. No evidence of aortic valve regurgitation is seen.   Pulmonic Valve:  The pulmonic valve is normal. No indication of pulmonic valve regurgitation.   Aorta:  The aortic root appears normal in size and structure. The ascending aorta was not well visualized. The aortic arch was not well visualized.   Pulmonary Artery:  The pulmonary artery is not well seen.   Venous:  Visualized portions of the inferior vena cava appear normal.   Shunts:  No evidence of intracardiac shunt.   Additional Comments:  No intracardiac thrombi, mass or vegetations are visualized at this level of resolution. A pacer wire is visualized in the right ventricle and right atrium.   Labs/Other Tests and Data Reviewed:    EKG:  An ECG dated 08/22/20 was personally reviewed today and demonstrated:  atrial sensed, v paced rhythm 85bpm, nonspecific STTW changes  Recent Labs: No results found for requested labs within last 365 days.   Recent Lipid Panel No results found for: "CHOL", "TRIG", "HDL", "CHOLHDL", "LDLCALC", "LDLDIRECT"  Wt Readings from Last 3 Encounters:  12/27/21 220 lb (99.8 kg)  12/06/21 220 lb (99.8 kg)  03/27/21 195 lb (88.5 kg)      Risk Assessment/Calculations:          Objective:    Vital Signs:  Initial BP 153/94 earlier this AM, rechecked VS on the phone now - BP 135/81   Pulse 91   Ht $R'5\' 6"'FF$  (1.676 m)   Wt 220 lb (99.8 kg)   BMI 35.51 kg/m    VS reviewed. General - female in no acute distress Pulm - No labored breathing, no coughing during visit, no audible wheezing, speaking in full sentences Neuro - A+Ox3, no slurred speech, answers questions appropriately Psych - Pleasant affect   ASSESSMENT & PLAN:    1. NICM/Chronic combined CHF - challenging to assess in the virtual platform, patient requested virtual visit. Fortunately she reports stability and denies any recent cardiac symptoms to suggest acute exacerbation. She will continue Lasix, potassium, and carvedilol at present doses. LVEF was normal by echocardiogram from 11/2020 at outside hospital therefore will hold off on initiation of ACEI/ARB/spironolactone given difficulty getting patient out of the house for labwork, recent AKI 10/2021, and normalized EF. This could be considered when seen in follow-up if blood pressure warrants. She is wheelchair bound/bedbound so infection may be a concern with SGLT2i. Her exam was described as benign by recent EP visit 12/06/21. We will continue present regimen for now and request 3 month follow-up in person to establish with new general cardiologist. Reviewed 2g sodium restriction, 2L fluid restriction, daily weights (as able) with patient.  2. H/o BiV-ICD - followed by Dr. Rayann Heman, yearly follow-up planned. Enrolled in ICM monitoring to begin 01/2022 per notes.  3. Essential HTN - Suboptimal blood pressure control noted today. We need a better idea of what it's running at home outside of today's visit as it was normal in the office when here earlier this month. The patient was provided instructions on monitoring blood pressure at home for 1 week and relaying results to our office. Could consider titrating carvedilol  dose if needed.   4. Diabetes mellitus, insulin dependent - patient reports extremely high blood sugar this AM of 491 but is asymptomatic. She states her DM is managed by PCP and I advised her to contact their office when we hang up to discuss  next steps and close follow-up. I also told her to have their office send [place] a copy of next labs when seen in the office.      Time:   Today, I have spent 11 minutes with the patient with telehealth technology discussing the above problems.     Medication Adjustments/Labs and Tests Ordered: Current medicines are reviewed at length with the patient today.  Concerns regarding medicines are outlined above.    Follow Up:  In Person in 3 month(s) with Veronia Beets. general cardiologist to establish care. Otherwise continue annual follow-up as outlined by EP.  Signed, Charlie Pitter, PA-C  12/27/2021 12:40 PM    [place]

## 2021-12-27 ENCOUNTER — Ambulatory Visit (INDEPENDENT_AMBULATORY_CARE_PROVIDER_SITE_OTHER): Payer: Medicare PPO | Admitting: Physician Assistant

## 2021-12-27 ENCOUNTER — Encounter: Payer: Self-pay | Admitting: Physician Assistant

## 2021-12-27 VITALS — BP 135/81 | HR 91 | Ht 66.0 in | Wt 220.0 lb

## 2021-12-27 DIAGNOSIS — Z95810 Presence of automatic (implantable) cardiac defibrillator: Secondary | ICD-10-CM

## 2021-12-27 DIAGNOSIS — I428 Other cardiomyopathies: Secondary | ICD-10-CM

## 2021-12-27 DIAGNOSIS — E119 Type 2 diabetes mellitus without complications: Secondary | ICD-10-CM

## 2021-12-27 DIAGNOSIS — I10 Essential (primary) hypertension: Secondary | ICD-10-CM | POA: Diagnosis not present

## 2021-12-27 DIAGNOSIS — I5042 Chronic combined systolic (congestive) and diastolic (congestive) heart failure: Secondary | ICD-10-CM

## 2021-12-27 DIAGNOSIS — Z794 Long term (current) use of insulin: Secondary | ICD-10-CM

## 2021-12-27 NOTE — Patient Instructions (Addendum)
Medication Instructions:  Your physician recommends that you continue on your current medications as directed. Please refer to the Current Medication list given to you today.   Labwork: None Ordered   Testing/Procedures: None Ordered  Follow-Up: Follow up in 3 months with our office. We will schedule you to see a new general cardiologist.   Any Other Special Instructions Will Be Listed Below (If Applicable).     If you need a refill on your cardiac medications before your next appointment, please call your pharmacy.  We need to get a better idea of what your blood pressure is running at home. Here are some instructions to follow:  - I would recommend using a blood pressure cuff that goes on your arm. The wrist ones can be inaccurate. If you're purchasing one for the first time, try to select one that also reports your heart rate because this can be helpful information as well.  - To check your blood pressure, choose a time at least 3 hours after taking your blood pressure medicines. If you can sample it at different times of the day, that's great - it might give you more information about how your blood pressure fluctuates. Remain seated in a chair for 5 minutes quietly beforehand, then check it.  - Please record a list of those readings and call us/send in MyChart message with them for our review in 1 week (01/03/22).   1. Watch your fluid intake. In general, you should not be taking in more than 2 liters of fluid per day (close to 64 oz of fluid per day). This includes sources of water in foods like soup, coffee, tea, milk, etc. It's important to stay hydrated but NOT to excess.  2. Weigh yourself on the same scale at same time of day and keep a log.  3. Call your doctor: (Anytime you feel any of the following symptoms)  - 3lb weight gain overnight or 5lb within a few days  - Shortness of breath, with or without a dry hacking cough  - Swelling in the hands, feet or stomach  - If you  have to sleep on extra pillows at night in order to breathe    Two Gram Sodium Diet 2000 mg  What is Sodium? Sodium is a mineral found naturally in many foods. The most significant source of sodium in the diet is table salt, which is about 40% sodium.  Processed, convenience, and preserved foods also contain a large amount of sodium.  The body needs only 500 mg of sodium daily to function,  A normal diet provides more than enough sodium even if you do not use salt.  Why Limit Sodium? A build up of sodium in the body can cause thirst, increased blood pressure, shortness of breath, and water retention.  Decreasing sodium in the diet can reduce edema and risk of heart attack or stroke associated with high blood pressure.  Keep in mind that there are many other factors involved in these health problems.  Heredity, obesity, lack of exercise, cigarette smoking, stress and what you eat all play a role.  General Guidelines: Do not add salt at the table or in cooking.  One teaspoon of salt contains over 2 grams of sodium. Read food labels Avoid processed and convenience foods Ask your dietitian before eating any foods not dicussed in the menu planning guidelines Consult your physician if you wish to use a salt substitute or a sodium containing medication such as antacids.  Limit milk  and milk products to 16 oz (2 cups) per day.  Shopping Hints: READ LABELS!! "Dietetic" does not necessarily mean low sodium. Salt and other sodium ingredients are often added to foods during processing.    Menu Planning Guidelines Food Group Choose More Often Avoid  Beverages (see also the milk group All fruit juices, low-sodium, salt-free vegetables juices, low-sodium carbonated beverages Regular vegetable or tomato juices, commercially softened water used for drinking or cooking  Breads and Cereals Enriched white, wheat, rye and pumpernickel bread, hard rolls and dinner rolls; muffins, cornbread and waffles; most dry  cereals, cooked cereal without added salt; unsalted crackers and breadsticks; low sodium or homemade bread crumbs Bread, rolls and crackers with salted tops; quick breads; instant hot cereals; pancakes; commercial bread stuffing; self-rising flower and biscuit mixes; regular bread crumbs or cracker crumbs  Desserts and Sweets Desserts and sweets mad with mild should be within allowance Instant pudding mixes and cake mixes  Fats Butter or margarine; vegetable oils; unsalted salad dressings, regular salad dressings limited to 1 Tbs; light, sour and heavy cream Regular salad dressings containing bacon fat, bacon bits, and salt pork; snack dips made with instant soup mixes or processed cheese; salted nuts  Fruits Most fresh, frozen and canned fruits Fruits processed with salt or sodium-containing ingredient (some dried fruits are processed with sodium sulfites        Vegetables Fresh, frozen vegetables and low- sodium canned vegetables Regular canned vegetables, sauerkraut, pickled vegetables, and others prepared in brine; frozen vegetables in sauces; vegetables seasoned with ham, bacon or salt pork  Condiments, Sauces, Miscellaneous  Salt substitute with physician's approval; pepper, herbs, spices; vinegar, lemon or lime juice; hot pepper sauce; garlic powder, onion powder, low sodium soy sauce (1 Tbs.); low sodium condiments (ketchup, chili sauce, mustard) in limited amounts (1 tsp.) fresh ground horseradish; unsalted tortilla chips, pretzels, potato chips, popcorn, salsa (1/4 cup) Any seasoning made with salt including garlic salt, celery salt, onion salt, and seasoned salt; sea salt, rock salt, kosher salt; meat tenderizers; monosodium glutamate; mustard, regular soy sauce, barbecue, sauce, chili sauce, teriyaki sauce, steak sauce, Worcestershire sauce, and most flavored vinegars; canned gravy and mixes; regular condiments; salted snack foods, olives, picles, relish, horseradish sauce, catsup   Food  preparation: Try these seasonings Meats:    Pork Sage, onion Serve with applesauce  Chicken Poultry seasoning, thyme, parsley Serve with cranberry sauce  Lamb Curry powder, rosemary, garlic, thyme Serve with mint sauce or jelly  Veal Marjoram, basil Serve with current jelly, cranberry sauce  Beef Pepper, bay leaf Serve with dry mustard, unsalted chive butter  Fish [place] leaf, dill Serve with unsalted lemon butter, unsalted parsley butter  Vegetables:    Asparagus Lemon juice   Broccoli Lemon juice   Carrots Mustard dressing parsley, mint, nutmeg, glazed with unsalted butter and sugar   Green beans Marjoram, lemon juice, nutmeg,dill seed   Tomatoes Basil, marjoram, onion   Spice /blend for [employer]" 4 tsp ground thyme 1 tsp ground sage 3 tsp ground rosemary 4 tsp ground marjoram   Test your knowledge A product that says "Salt Free" may still contain sodium. True or False Garlic Powder and Hot Pepper Sauce an be used as alternative seasonings.True or False Processed foods have more sodium than fresh foods.  True or False Canned Vegetables have less sodium than froze True or False   WAYS TO DECREASE YOUR SODIUM INTAKE Avoid the use of added salt in cooking and at the table.  Table  salt (and other prepared seasonings which contain salt) is probably one of the greatest sources of sodium in the diet.  Unsalted foods can gain flavor from the sweet, sour, and butter taste sensations of herbs and spices.  Instead of using salt for seasoning, try the following seasonings with the foods listed.  Remember: how you use them to enhance natural food flavors is limited only by your creativity... Allspice-Meat, fish, eggs, fruit, peas, red and yellow vegetables Almond Extract-Fruit baked goods Anise Seed-Sweet breads, fruit, carrots, beets, cottage cheese, cookies (tastes like licorice) Basil-Meat, fish, eggs, vegetables, rice, vegetables salads, soups, sauces Bay Leaf-Meat, fish, stews,  poultry Burnet-Salad, vegetables (cucumber-like flavor) Caraway Seed-Bread, cookies, cottage cheese, meat, vegetables, cheese, rice Cardamon-Baked goods, fruit, soups Celery Powder or seed-Salads, salad dressings, sauces, meatloaf, soup, bread.Do not use  celery salt Chervil-Meats, salads, fish, eggs, vegetables, cottage cheese (parsley-like flavor) Chili Power-Meatloaf, chicken cheese, corn, eggplant, egg dishes Chives-Salads cottage cheese, egg dishes, soups, vegetables, sauces Cilantro-Salsa, casseroles Cinnamon-Baked goods, fruit, pork, lamb, chicken, carrots Cloves-Fruit, baked goods, fish, pot roast, green beans, beets, carrots Coriander-Pastry, cookies, meat, salads, cheese (lemon-orange flavor) Cumin-Meatloaf, fish,cheese, eggs, cabbage,fruit pie (caraway flavor) [employer], fruit, eggs, fish, poultry, cottage cheese, vegetables Dill Seed-Meat, cottage cheese, poultry, vegetables, fish, salads, bread Fennel Seed-Bread, cookies, apples, pork, eggs, fish, beets, cabbage, cheese, Licorice-like flavor Garlic-(buds or powder) Salads, meat, poultry, fish, bread, butter, vegetables, potatoes.Do not  use garlic salt Ginger-Fruit, vegetables, baked goods, meat, fish, poultry Horseradish Root-Meet, vegetables, butter Lemon Juice or Extract-Vegetables, fruit, tea, baked goods, fish salads Mace-Baked goods fruit, vegetables, fish, poultry (taste like nutmeg) Maple Extract-Syrups Marjoram-Meat, chicken, fish, vegetables, breads, green salads (taste like Sage) Mint-Tea, lamb, sherbet, vegetables, desserts, carrots, cabbage Mustard, Dry or Seed-Cheese, eggs, meats, vegetables, poultry Nutmeg-Baked goods, fruit, chicken, eggs, vegetables, desserts Onion Powder-Meat, fish, poultry, vegetables, cheese, eggs, bread, rice salads (Do not use   Onion salt) Orange Extract-Desserts, baked goods Oregano-Pasta, eggs, cheese, onions, pork, lamb, fish, chicken, vegetables, green salads Paprika-Meat,  fish, poultry, eggs, cheese, vegetables Parsley Flakes-Butter, vegetables, meat fish, poultry, eggs, bread, salads (certain forms may   Contain sodium Pepper-Meat fish, poultry, vegetables, eggs Peppermint Extract-Desserts, baked goods Poppy Seed-Eggs, bread, cheese, fruit dressings, baked goods, noodles, vegetables, cottage  [employer], poultry, meat, fish, cauliflower, turnips,eggs bread Saffron-Rice, bread, veal, chicken, fish, eggs Sage-Meat, fish, poultry, onions, eggplant, tomateos, pork, stews Savory-Eggs, salads, poultry, meat, rice, vegetables, soups, pork Tarragon-Meat, poultry, fish, eggs, butter, vegetables (licorice-like flavor)  Thyme-Meat, poultry, fish, eggs, vegetables, (clover-like flavor), sauces, soups Tumeric-Salads, butter, eggs, fish, rice, vegetables (saffron-like flavor) Vanilla Extract-Baked goods, candy Vinegar-Salads, vegetables, meat marinades Walnut Extract-baked goods, candy   2. Choose your Foods Wisely   The following is a list of foods to avoid which are high in sodium:  Meats-Avoid all smoked, canned, salt cured, dried and kosher meat and fish as well as Anchovies   [employer] meats:Bologna, Liverwurst, Pastrami Canned meat or fish  Marinated herring Caviar    Pepperoni Corned Beef   Pizza Dried chipped beef  Salami Frozen breaded fish or meat Salt pork Frankfurters or hot dogs  Sardines Gefilte fish   Sausage Ham (boiled ham, Proscuitto Smoked butt    spiced ham)   Spam      TV Dinners Vegetables Canned vegetables (Regular) Relish Canned mushrooms  Sauerkraut Olives    Tomato juice Pickles  Bakery and Dessert Products Canned puddings  Cream pies Cheesecake   Decorated cakes Cookies  Beverages/Juices Tomato juice, regular  Gatorade   V-8 vegetable juice, regular  Breads and Cereals Biscuit mixes   Salted potato chips, corn chips, pretzels Bread stuffing mixes  Salted crackers  and rolls Pancake and waffle mixes Self-rising flour  Seasonings Accent    Meat sauces Barbecue sauce  Meat tenderizer Catsup    Monosodium glutamate (MSG) Celery salt   Onion salt Chili sauce   Prepared mustard Garlic salt   Salt, seasoned salt, sea salt Gravy mixes   Soy sauce Horseradish   Steak sauce Ketchup   Tartar sauce Lite salt    Teriyaki sauce Marinade mixes   Worcestershire sauce  Others Baking powder   Cocoa and cocoa mixes Baking soda   Commercial casserole mixes Candy-caramels, chocolate  Dehydrated soups    Bars, fudge,nougats  Instant rice and pasta mixes Canned broth or soup  Maraschino cherries Cheese, aged and processed cheese and cheese spreads  Learning Assessment Quiz  Indicated T (for True) or F (for False) for each of the following statements:  _____ Fresh fruits and vegetables and unprocessed grains are generally low in sodium _____ Water may contain a considerable amount of sodium, depending on the source _____ You can always tell if a food is high in sodium by tasting it _____ Certain laxatives my be high in sodium and should be avoided unless prescribed   by a physician or pharmacist _____ Salt substitutes may be used freely by anyone on a sodium restricted diet _____ Sodium is present in table salt, food additives and as a natural component of   most foods _____ Table salt is approximately 90% sodium _____ Limiting sodium intake may help prevent excess fluid accumulation in the body _____ On a sodium-restricted diet, seasonings such as bouillon soy sauce, and    cooking wine should be used in place of table salt _____ On an ingredient list, a product which lists monosodium glutamate as the first   ingredient is an appropriate food to include on a low sodium diet  Circle the best answer(s) to the following statements (Hint: there may be more than one correct answer)  11. On a low-sodium diet, some acceptable snack items are:    A. Olives  F.  Bean dip   K. Grapefruit juice    B. Salted Pretzels G. Commercial Popcorn   L. Canned peaches    C. Carrot Sticks  H. Bouillon   M. Unsalted nuts   D. [place] fries  I. Peanut butter crackers N. Salami   E. Sweet pickles J. Tomato Juice   O. Pizza  12.  Seasonings that may be used freely on a reduced - sodium diet include   A. Lemon wedges F.Monosodium glutamate K. Celery seed    B.Soysauce   G. Pepper   L. Mustard powder   C. Sea salt  H. Cooking wine  M. Onion flakes   D. Vinegar  E. Prepared horseradish N. Salsa   E. Sage   J. Worcestershire sauce  O. Chutney

## 2022-01-02 ENCOUNTER — Ambulatory Visit (INDEPENDENT_AMBULATORY_CARE_PROVIDER_SITE_OTHER): Payer: Medicare PPO

## 2022-01-02 DIAGNOSIS — I428 Other cardiomyopathies: Secondary | ICD-10-CM | POA: Diagnosis not present

## 2022-01-02 LAB — CUP PACEART REMOTE DEVICE CHECK
Battery Remaining Longevity: 55 mo
Battery Remaining Percentage: 60 %
Battery Voltage: 2.95 V
Brady Statistic AP VP Percent: 1 %
Brady Statistic AP VS Percent: 0 %
Brady Statistic AS VP Percent: 99 %
Brady Statistic AS VS Percent: 1 %
Brady Statistic RA Percent Paced: 1 %
Date Time Interrogation Session: 20230831072646
HighPow Impedance: 48 Ohm
HighPow Impedance: 48 Ohm
Implantable Lead Location: 753859
Implantable Lead Location: 753860
Implantable Lead Model: 6947
Lead Channel Impedance Value: 1025 Ohm
Lead Channel Impedance Value: 430 Ohm
Lead Channel Impedance Value: 590 Ohm
Lead Channel Pacing Threshold Amplitude: 0.5 V
Lead Channel Pacing Threshold Amplitude: 0.5 V
Lead Channel Pacing Threshold Amplitude: 0.875 V
Lead Channel Pacing Threshold Pulse Width: 0.5 ms
Lead Channel Pacing Threshold Pulse Width: 0.5 ms
Lead Channel Pacing Threshold Pulse Width: 0.5 ms
Lead Channel Sensing Intrinsic Amplitude: 3.2 mV
Lead Channel Sensing Intrinsic Amplitude: 8.3 mV
Lead Channel Setting Pacing Amplitude: 1.5 V
Lead Channel Setting Pacing Amplitude: 2 V
Lead Channel Setting Pacing Amplitude: 2 V
Lead Channel Setting Pacing Pulse Width: 0.5 ms
Lead Channel Setting Pacing Pulse Width: 0.5 ms
Lead Channel Setting Sensing Sensitivity: 0.5 mV
Pulse Gen Serial Number: 9895991

## 2022-01-07 ENCOUNTER — Telehealth: Payer: Self-pay

## 2022-01-07 NOTE — Telephone Encounter (Signed)
Repeat device alert for LV impedance out of range 8/25 @ 19:33, impedance 1250 ohms  Discussed with CL.      Will continue to follow.  Next remote check 01/20/2022.

## 2022-01-14 ENCOUNTER — Telehealth: Payer: Self-pay

## 2022-01-14 ENCOUNTER — Ambulatory Visit (INDEPENDENT_AMBULATORY_CARE_PROVIDER_SITE_OTHER): Payer: Medicare PPO

## 2022-01-14 DIAGNOSIS — I5042 Chronic combined systolic (congestive) and diastolic (congestive) heart failure: Secondary | ICD-10-CM

## 2022-01-14 DIAGNOSIS — Z95810 Presence of automatic (implantable) cardiac defibrillator: Secondary | ICD-10-CM | POA: Diagnosis not present

## 2022-01-14 NOTE — Progress Notes (Signed)
EPIC Encounter for ICM Monitoring  Patient Name: Dana Grant is a 71 y.o. female Date: 01/14/2022 Primary Care Physican: Vick Frees, MD Primary Cardiologist: [facility] Electrophysiologist: [facility] Pacing: >99%  01/14/2022 Weight: unknown       1st ICM remote transmission.  Attempted call to patient and unable to reach.  Left detailed message per DPR regarding transmission. Transmission reviewed.           CorVue thoracic impedance suggesting normal fluid levels.   Prescribed:  Furosemide 20 mg take 1 tablet(s) (20 mg total) by mouth daily. Potassium 10 mEq take 1 tablet(s) (10 mEq total) by mouth daily.  Recommendations: Left voice mail with ICM number and encouraged to call if experiencing any fluid symptoms.  Follow-up plan: ICM clinic phone appointment on 02/17/2022.   91 day device clinic remote transmission 04/03/2022.    EP/Cardiology Office Visits:   Recall 03/27/2022 with PA/NP.    Copy of ICM check sent to Dr. Nelly Laurence.   3 month ICM trend: 01/14/2022.    12-14 Month ICM trend:     Karie Soda, RN 01/14/2022 3:56 PM

## 2022-01-14 NOTE — Telephone Encounter (Signed)
Remote ICM transmission received.  Attempted call to patient regarding ICM remote transmission and left detailed message per DPR.  Advised to return call for any fluid symptoms or questions. Next ICM remote transmission scheduled 02/17/2022.    

## 2022-01-23 NOTE — Progress Notes (Signed)
Remote ICD transmission.   

## 2022-01-31 ENCOUNTER — Telehealth: Payer: Self-pay

## 2022-01-31 NOTE — Telephone Encounter (Signed)
Per [facility] rep, upper impedance for Pt should be set at 2000 ohms.  Will need to reprogram at next office visit.  LV lead impedance warning has been disabled in [facility] website d/t frequent alerts received for stable LV lead.

## 2022-02-17 ENCOUNTER — Ambulatory Visit (INDEPENDENT_AMBULATORY_CARE_PROVIDER_SITE_OTHER): Payer: Medicare PPO

## 2022-02-17 DIAGNOSIS — Z95810 Presence of automatic (implantable) cardiac defibrillator: Secondary | ICD-10-CM

## 2022-02-17 DIAGNOSIS — I5042 Chronic combined systolic (congestive) and diastolic (congestive) heart failure: Secondary | ICD-10-CM | POA: Diagnosis not present

## 2022-02-21 ENCOUNTER — Telehealth: Payer: Self-pay

## 2022-02-21 NOTE — Progress Notes (Signed)
EPIC Encounter for ICM Monitoring  Patient Name: Dana Grant is a 71 y.o. female Date: 02/21/2022 Primary Care Physican: Julian Hy, MD Primary Cardiologist: [facility] [facility] Electrophysiologist: [facility] Pacing: >99%          01/14/2022 Weight: unknown                                                            Attempted call to patient and unable to reach.  Left detailed message per DPR regarding transmission. Transmission reviewed.           CorVue thoracic impedance suggesting normal fluid levels with exception of possible fluid accumulation 10/4-10/8.    Prescribed:  Furosemide 20 mg take 1 tablet(s) (20 mg total) by mouth daily. Potassium 10 mEq take 1 tablet(s) (10 mEq total) by mouth daily.   Recommendations: Left voice mail with ICM number and encouraged to call if experiencing any fluid symptoms.   Follow-up plan: ICM clinic phone appointment on 03/24/2022.   91 day device clinic remote transmission 04/03/2022.     EP/Cardiology Office Visits:   Recall 03/27/2022 with PA/NP.     Copy of ICM check sent to Dr. Myles Gip.   3 month ICM trend: 02/17/2022.    12-14 Month ICM trend:     Rosalene Billings, RN 02/21/2022 5:09 PM

## 2022-02-21 NOTE — Telephone Encounter (Signed)
Remote ICM transmission received.  Attempted call to patient regarding ICM remote transmission and left detailed message per DPR.  Advised to return call for any fluid symptoms or questions. Next ICM remote transmission scheduled 03/24/2022.    

## 2022-03-13 ENCOUNTER — Ambulatory Visit: Payer: Medicare PPO | Admitting: Internal Medicine

## 2022-03-24 ENCOUNTER — Encounter: Payer: Self-pay | Admitting: Internal Medicine

## 2022-03-24 ENCOUNTER — Telehealth: Payer: Self-pay | Admitting: Internal Medicine

## 2022-03-24 ENCOUNTER — Ambulatory Visit (INDEPENDENT_AMBULATORY_CARE_PROVIDER_SITE_OTHER): Payer: Medicare PPO

## 2022-03-24 ENCOUNTER — Ambulatory Visit: Payer: Medicare PPO | Attending: Internal Medicine | Admitting: Internal Medicine

## 2022-03-24 VITALS — BP 118/62 | HR 112 | Ht 66.0 in

## 2022-03-24 DIAGNOSIS — I5022 Chronic systolic (congestive) heart failure: Secondary | ICD-10-CM | POA: Diagnosis not present

## 2022-03-24 DIAGNOSIS — Z95810 Presence of automatic (implantable) cardiac defibrillator: Secondary | ICD-10-CM | POA: Diagnosis not present

## 2022-03-24 DIAGNOSIS — I5042 Chronic combined systolic (congestive) and diastolic (congestive) heart failure: Secondary | ICD-10-CM

## 2022-03-24 DIAGNOSIS — I428 Other cardiomyopathies: Secondary | ICD-10-CM | POA: Diagnosis not present

## 2022-03-24 DIAGNOSIS — I2510 Atherosclerotic heart disease of native coronary artery without angina pectoris: Secondary | ICD-10-CM | POA: Insufficient documentation

## 2022-03-24 MED ORDER — ASPIRIN 81 MG PO TBEC: 81.0000 mg | DELAYED_RELEASE_TABLET | Freq: Every day | ORAL | 3 refills | Status: DC

## 2022-03-24 NOTE — Telephone Encounter (Signed)
  Patient Consent for Virtual Visit        Dana Grant has provided verbal consent on 03/24/2022 for a virtual visit (video or telephone).   CONSENT FOR VIRTUAL VISIT FOR:  Dana Grant  By participating in this virtual visit I agree to the following:  I hereby voluntarily request, consent and authorize [facility] and its employed or contracted physicians, physician assistants, nurse practitioners or other licensed health care professionals (the Practitioner), to provide me with telemedicine health care services (the "Services") as deemed necessary by the treating Practitioner. I acknowledge and consent to receive the Services by the Practitioner via telemedicine. I understand that the telemedicine visit will involve communicating with the Practitioner through live audiovisual communication technology and the disclosure of certain medical information by electronic transmission. I acknowledge that I have been given the opportunity to request an in-person assessment or other available alternative prior to the telemedicine visit and am voluntarily participating in the telemedicine visit.  I understand that I have the right to withhold or withdraw my consent to the use of telemedicine in the course of my care at any time, without affecting my right to future care or treatment, and that the Practitioner or I may terminate the telemedicine visit at any time. I understand that I have the right to inspect all information obtained and/or recorded in the course of the telemedicine visit and may receive copies of available information for a reasonable fee.  I understand that some of the potential risks of receiving the Services via telemedicine include:  Delay or interruption in medical evaluation due to technological equipment failure or disruption; Information transmitted may not be sufficient (e.g. poor resolution of images) to allow for appropriate medical decision making by the  Practitioner; and/or  In rare instances, security protocols could fail, causing a breach of personal health information.  Furthermore, I acknowledge that it is my responsibility to provide information about my medical history, conditions and care that is complete and accurate to the best of my ability. I acknowledge that Practitioner's advice, recommendations, and/or decision may be based on factors not within their control, such as incomplete or inaccurate data provided by me or distortions of diagnostic images or specimens that may result from electronic transmissions. I understand that the practice of medicine is not an exact science and that Practitioner makes no warranties or guarantees regarding treatment outcomes. I acknowledge that a copy of this consent can be made available to me via my patient portal [facility] MyChart), or I can request a printed copy by calling the office of [facility].    I understand that my insurance will be billed for this visit.   I have read or had this consent read to me. I understand the contents of this consent, which adequately explains the benefits and risks of the Services being provided via telemedicine.  I have been provided ample opportunity to ask questions regarding this consent and the Services and have had my questions answered to my satisfaction. I give my informed consent for the services to be provided through the use of telemedicine in my medical care

## 2022-03-24 NOTE — Progress Notes (Signed)
Cardiology Office Note  Date: 03/24/2022   ID: Dana, Grant [birth year], MRN [id number]  PCP:  Vick Frees, MD  Cardiologist:  Marjo Bicker, MD Electrophysiologist:  Hillis Range, MD   Reason for Office Visit: Follow-up of HF improved LVEF   History of Present Illness: Dana Grant is a 71 y.o. female known to have HF improved LVEF (25% in 2012 to 60 to 65% in 2017) s/p CRT-D, moderate three-vessel CAD (LHC in 2012), HTN, DM 2, HLD, OSA intolerant of CPAP presented to the cardiology clinic for follow-up visit.  Patient is wheelchair-bound and cannot walk, however she can move around to perform daily activities. Denied any angina, DOE, dizziness/lightheadedness, syncope, LE swelling or palpitations. She follows up with EP for device checks. Her husband checks her BP and HR at home and stays normal. HR at home is in the range of 80 to 90s.  She is currently on Lasix and Coreg for HF improved LVEF as she was not able to tolerate ACE/ARB due to AKI at the time.  Past Medical History:  Diagnosis Date   AICD (automatic cardioverter/defibrillator) present    Anxiety    Anxiety state 07/18/2019   ICD10 Conversion   Back pain    BBB (bundle branch block)    BMI 39.0-39.9,adult    Breast mass 03/01/1999   CAD (coronary artery disease)    Cardiopathy    CHF (congestive heart failure) (HCC)    Chronic ulcer of other specified site 07/18/2019   Complication of anesthesia    Yes N & V   DM (diabetes mellitus) (HCC)    Dyslipidemia    Essential hypertension 09/02/1999   ICD10 Conversion   Gastroparesis 07/18/2019   History of hiatal hernia    Hyperlipidemia, mixed 09/02/1999   Hypertension    Hypothyroidism 07/18/2019   ICD10 Conversion   Left ventricular systolic dysfunction    Major depressive disorder, recurrent episode, moderate (HCC) 07/18/2019   Mastodynia 07/18/2019   Migraine    Myocardial infarction (HCC) 2011   Nonischemic cardiomyopathy (HCC)     Obesity 03/01/1999   ICD10 Conversion   OSA on CPAP    Other dyspnea and respiratory abnormality 07/18/2019   PONV (postoperative nausea and vomiting)    Proteinuria 07/18/2019   Spondylosis    Type II or unspecified type diabetes mellitus without mention of complication, not stated as uncontrolled 07/18/2019   Vitamin B12 deficiency     Past Surgical History:  Procedure Laterality Date   ABDOMINAL HYSTERECTOMY     ANTERIOR LAT LUMBAR FUSION N/A 11/03/2019   Procedure: Lumbar Two-Three Lumbar Three-Four Lumbar Four-Five Anterolateral lumbar interbody fusion with percutaneous pedicle screws;  Surgeon: Maeola Harman, MD;  Location: [facility] OR;  Service: Neurosurgery;  Laterality: N/A;  Lumbar Two-Three Lumbar Three-Four Lumbar Four-Five Anterolateral lumbar interbody fusion with percutaneous pedicle screws   BACK SURGERY  2009   CARDIAC DEFIBRILLATOR PLACEMENT  01/08/2011   implanted in [place] ([facility]) BiV ICD   CESAREAN SECTION     CHOLECYSTECTOMY     IMPLANTABLE CARDIOVERTER DEFIBRILLATOR GENERATOR CHANGE  10/21/2018   placed in [place],  [employer] Jude model EH2094-70J ([place] [id number]) BiV ICD,  RV lead- [phone] (SN [id number])  implanted by Dr Sabino Donovan   INSERT / REPLACE / REMOVE PACEMAKER  2012   LUMBAR PERCUTANEOUS PEDICLE SCREW 3 LEVEL N/A 11/03/2019   Procedure: LUMBAR PERCUTANEOUS PEDICLE SCREW 3 LEVEL;  Surgeon: Maeola Harman, MD;  Location: [facility] OR;  Service: Neurosurgery;  Laterality: N/A;    Current Outpatient Medications  Medication Sig Dispense Refill   acetaminophen (TYLENOL) 500 MG tablet Take 2 tablets (1,000 mg total) by mouth every 8 (eight) hours. 30 tablet 0   atorvastatin (LIPITOR) 20 MG tablet Take 20 mg by mouth daily.     bisacodyl (DULCOLAX) 10 MG suppository Place 1 suppository rectally as needed for constipation.     carvedilol (COREG) 25 MG tablet Take 12.5 mg by mouth 2 (two) times daily.     cyanocobalamin (,VITAMIN B-12,) 1000 MCG/ML injection Inject 1,000 mcg into  the muscle every 14 (fourteen) days.      diclofenac (VOLTAREN) 75 MG EC tablet Take 75 mg by mouth 2 (two) times daily.     DULoxetine (CYMBALTA) 30 MG capsule Take 30 mg by mouth in the morning and at bedtime.      ferrous sulfate 325 (65 FE) MG tablet Take 1 tablet by mouth in the morning and at bedtime.     furosemide (LASIX) 20 MG tablet Take 1 tablet by mouth daily.     gabapentin (NEURONTIN) 400 MG capsule Take 400 mg by mouth 3 (three) times daily.     insulin NPH-regular Human (70-30) 100 UNIT/ML injection Inject 30 Units into the skin 2 (two) times daily. (Patient taking differently: Inject 40 Units into the skin 2 (two) times daily.) 10 mL 0   levothyroxine (SYNTHROID) 100 MCG tablet Take 100 mcg by mouth daily before breakfast.     linaclotide (LINZESS) 290 MCG CAPS capsule Take 290 mcg by mouth daily before breakfast.     methocarbamol (ROBAXIN) 500 MG tablet Take 500 mg by mouth 2 (two) times daily.     metoCLOPramide (REGLAN) 5 MG tablet Take 5 mg by mouth 4 (four) times daily.     NOVOLOG MIX 70/30 FLEXPEN (70-30) 100 UNIT/ML FlexPen Inject 40 Units into the skin 2 (two) times daily.     ondansetron (ZOFRAN) 4 MG tablet Take 4 mg by mouth every 6 (six) hours as needed for nausea or vomiting.     pantoprazole (PROTONIX) 40 MG tablet Take 1 tablet (40 mg total) by mouth daily at 12 noon.     polyethylene glycol (MIRALAX / GLYCOLAX) 17 g packet Take 17 g by mouth daily. 14 each 0   potassium chloride (KLOR-CON M) 10 MEQ tablet Take 10 mEq by mouth daily.     Vitamin D, Ergocalciferol, (DRISDOL) 1.25 MG (50000 UNIT) CAPS capsule Take 50,000 Units by mouth every 7 (seven) days.     No current facility-administered medications for this visit.   Allergies:  Codeine, Sulfa antibiotics, Demerol [meperidine], Lisinopril, Morphine and related, and Penicillins   Social History: The patient  reports that she has never smoked. She has never used smokeless tobacco. She reports that she does  not drink alcohol and does not use drugs.   Family History: The patient's family history includes CAD in her mother.   ROS:  Please see the history of present illness. Otherwise, complete review of systems is positive for none.  All other systems are reviewed and negative.   Physical Exam: VS:  BP 118/62   Pulse (!) 112   Ht 5\' 6"  (1.676 m)   SpO2 95%   BMI 35.51 kg/m , BMI Body mass index is 35.51 kg/m.  Wt Readings from Last 3 Encounters:  12/27/21 220 lb (99.8 kg)  12/06/21 220 lb (99.8 kg)  03/27/21 195 lb (88.5 kg)    General: Patient appears  comfortable at rest. HEENT: Conjunctiva and lids normal, oropharynx clear with moist mucosa. Neck: Supple, no elevated JVP or carotid bruits, no thyromegaly. Lungs: Clear to auscultation, nonlabored breathing at rest. Cardiac: Regular rate and rhythm, no S3 or significant systolic murmur, no pericardial rub. Abdomen: Soft, nontender, no hepatomegaly, bowel sounds present, no guarding or rebound. Extremities: No pitting edema Skin: Warm and dry. Musculoskeletal: No kyphosis. Neuropsychiatric: Alert and oriented x3, affect grossly appropriate.  Recent Labwork: No results found for requested labs within last 365 days.  No results found for: "CHOL", "TRIG", "HDL", "CHOLHDL", "VLDL", "LDLCALC", "LDLDIRECT"  Other Studies Reviewed Today: I reviewed the following study reports from care everywhere today Nonischemic cardiomyopathy -functional class II /III CHF -Severe left ventricular systolic dysfunction ejection fraction of 25% , echo Aug 2012 -Moderate left ventricular systolic impairment cardiac cath April 2012 LV EF 40% - status post prophylactic biventricular ICD implant [employer] device September 2012 ,[facility] [place] - Device under advisory re: early battery depletion; adequate interrogation August 20, 2018 ERI 5 months with thoracic impedance suggesting hypervolemia - Normalized left ventricular ejection fraction 60-65% by echocardiography  February 2015 and Dec 2017 Coronary artery disease with moderate three-vessel involvement without angina cath April 2012 [facility] Feb 2016 revealed mild partially reversible apical defect suggestive of ischemia gated ejection fraction 68%   Assessment and Plan: Patient is a 71 year old F known to have HF improved LVEF (25% in 2012 to 60 to 65% in 2017) s/p CRT-D, moderate three-vessel CAD (LHC in 2012), HTN, DM 2, HLD, OSA intolerant of CPAP presented to the cardiology clinic for follow-up visit.  #HF improved LVEF (25% in 2012 to 60 to 65% in 2017) s/p CRT-D -Continue Lasix 20 mg once daily -Continue carvedilol 12.5 mg twice daily -Patient is allergic to lisinopril per allergy list and patient cannot recall.  Patient was earlier on losartan but that was stopped due to questionable AKI per chart review. -We will obtain 2D echocardiogram to establish baseline LVEF and to evaluate for any valve abnormalities.  If her LVEF is less than 50%, will start low-dose losartan.  #Moderate three-vessel CAD (LHC in 2012) -Start aspirin 81 mg once daily -Continue atorvastatin 20 mg nightly  #HLD, unknown values -Continue atorvastatin 20 mg nightly. -Lipid management per PCP  I have spent a total of 33 minutes with patient reviewing chart, EKGs, labs and examining patient as well as establishing an assessment and plan that was discussed with the patient.  > 50% of time was spent in direct patient care.      Medication Adjustments/Labs and Tests Ordered: Current medicines are reviewed at length with the patient today.  Concerns regarding medicines are outlined above.   Tests Ordered: Orders Placed This Encounter  Procedures   EKG 12-Lead    Medication Changes: No orders of the defined types were placed in this encounter.   Disposition:  Follow up  3 months, virtual visit  Signed Kemi Gell Verne Spurr, MD, 03/24/2022 1:43 PM    [facility] at  [facility] [address] [place], [place], [place] [address]

## 2022-03-24 NOTE — Patient Instructions (Addendum)
Medication Instructions:  Your physician has recommended you make the following change in your medication: Start aspirin 81 mg daily Continue other medications the same  Labwork: none  Testing/Procedures: Your physician has requested that you have an echocardiogram. Echocardiography is a painless test that uses sound waves to create images of your heart. It provides your doctor with information about the size and shape of your heart and how well your heart's chambers and valves are working. This procedure takes approximately one hour. There are no restrictions for this procedure. Please do NOT wear cologne, perfume, aftershave, or lotions (deodorant is allowed). Please arrive 15 minutes prior to your appointment time.  Follow-Up: Your physician recommends that you schedule a follow-up appointment in: 3 months (telephone visit)   Any Other Special Instructions Will Be Listed Below (If Applicable).  If you need a refill on your cardiac medications before your next appointment, please call your pharmacy.

## 2022-03-24 NOTE — Progress Notes (Unsigned)
EPIC Encounter for ICM Monitoring  Patient Name: Dana Grant is a 71 y.o. female Date: 03/24/2022 Primary Care Physican: Vick Frees, MD Primary Cardiologist: [facility] Electrophysiologist: [employer] Bi-V Pacing: >99%          01/14/2022 Weight: unknown                                                            Transmission reviewed.           CorVue thoracic impedance suggesting normal fluid levels..    Prescribed:  Furosemide 20 mg take 1 tablet(s) (20 mg total) by mouth daily. Potassium 10 mEq take 1 tablet(s) (10 mEq total) by mouth daily.   Recommendations:  No changes.   Follow-up plan: ICM clinic phone appointment on 05/12/2022.   91 day device clinic remote transmission 04/03/2022.     EP/Cardiology Office Visits:   Recall 06/20/2022 with Dr Jenene Slicker.     Copy of ICM check sent to Dr. Nelly Laurence.    3 month ICM trend: 03/24/2022.    12-14 Month ICM trend:     Karie Soda, RN 03/24/2022 12:34 PM

## 2022-03-25 ENCOUNTER — Ambulatory Visit: Payer: Medicare PPO | Attending: Internal Medicine

## 2022-03-25 DIAGNOSIS — I428 Other cardiomyopathies: Secondary | ICD-10-CM | POA: Diagnosis not present

## 2022-03-25 LAB — ECHOCARDIOGRAM COMPLETE
AR max vel: 2.23 cm2
AV Peak grad: 5.9 mmHg
Ao pk vel: 1.21 m/s
Area-P 1/2: 6.27 cm2
Calc EF: 71.6 %
P 1/2 time: 2055 msec
S' Lateral: 1.7 cm
Single Plane A2C EF: 76.7 %
Single Plane A4C EF: 66.2 %

## 2022-04-03 ENCOUNTER — Ambulatory Visit (INDEPENDENT_AMBULATORY_CARE_PROVIDER_SITE_OTHER): Payer: Medicare PPO

## 2022-04-03 DIAGNOSIS — I428 Other cardiomyopathies: Secondary | ICD-10-CM | POA: Diagnosis not present

## 2022-04-05 LAB — CUP PACEART REMOTE DEVICE CHECK
Battery Remaining Longevity: 50 mo
Battery Remaining Percentage: 57 %
Battery Voltage: 2.95 V
Brady Statistic AP VP Percent: 1 %
Brady Statistic AP VS Percent: 0 %
Brady Statistic AS VP Percent: 99 %
Brady Statistic AS VS Percent: 1 %
Brady Statistic RA Percent Paced: 1 %
Date Time Interrogation Session: 20231130061656
HighPow Impedance: 48 Ohm
HighPow Impedance: 49 Ohm
Implantable Lead Connection Status: 753985
Implantable Lead Connection Status: 753985
Implantable Lead Location: 753859
Implantable Lead Location: 753860
Implantable Lead Model: 6947
Lead Channel Impedance Value: 1025 Ohm
Lead Channel Impedance Value: 390 Ohm
Lead Channel Impedance Value: 580 Ohm
Lead Channel Pacing Threshold Amplitude: 0.5 V
Lead Channel Pacing Threshold Amplitude: 0.625 V
Lead Channel Pacing Threshold Amplitude: 1 V
Lead Channel Pacing Threshold Pulse Width: 0.5 ms
Lead Channel Pacing Threshold Pulse Width: 0.5 ms
Lead Channel Pacing Threshold Pulse Width: 0.5 ms
Lead Channel Sensing Intrinsic Amplitude: 3.5 mV
Lead Channel Sensing Intrinsic Amplitude: 8.3 mV
Lead Channel Setting Pacing Amplitude: 1.625
Lead Channel Setting Pacing Amplitude: 2 V
Lead Channel Setting Pacing Amplitude: 2 V
Lead Channel Setting Pacing Pulse Width: 0.5 ms
Lead Channel Setting Pacing Pulse Width: 0.5 ms
Lead Channel Setting Sensing Sensitivity: 0.5 mV
Pulse Gen Serial Number: 9895991

## 2022-04-24 NOTE — Progress Notes (Signed)
Remote ICD transmission.   

## 2022-05-12 ENCOUNTER — Ambulatory Visit (INDEPENDENT_AMBULATORY_CARE_PROVIDER_SITE_OTHER): Payer: Medicare PPO

## 2022-05-12 DIAGNOSIS — I5042 Chronic combined systolic (congestive) and diastolic (congestive) heart failure: Secondary | ICD-10-CM

## 2022-05-12 DIAGNOSIS — Z95810 Presence of automatic (implantable) cardiac defibrillator: Secondary | ICD-10-CM | POA: Diagnosis not present

## 2022-05-15 NOTE — Progress Notes (Signed)
EPIC Encounter for ICM Monitoring  Patient Name: Dana Grant is a 72 y.o. female Date: 05/15/2022 Primary Care Physican: Julian Hy, MD Primary Cardiologist: [facility] Electrophysiologist: [employer] Bi-V Pacing: >99%          01/14/2022 Weight: unknown                                                            Transmission reviewed.           CorVue thoracic impedance suggesting normal fluid levels.    Prescribed:  Furosemide 20 mg take 1 tablet(s) (20 mg total) by mouth daily. Potassium 10 mEq take 1 tablet(s) (10 mEq total) by mouth daily.   Recommendations:  No changes.   Follow-up plan: ICM clinic phone appointment on 06/16/2022.   91 day device clinic remote transmission 07/03/2022.     EP/Cardiology Office Visits:  06/20/2022 with Dr Dellia Cloud.     Copy of ICM check sent to Dr. Myles Gip.     3 month ICM trend: 05/12/2022.    12-14 Month ICM trend:     Rosalene Billings, RN 05/15/2022 9:15 AM

## 2022-06-16 ENCOUNTER — Ambulatory Visit: Payer: Medicare PPO | Attending: Cardiovascular Disease

## 2022-06-16 DIAGNOSIS — Z95810 Presence of automatic (implantable) cardiac defibrillator: Secondary | ICD-10-CM

## 2022-06-16 DIAGNOSIS — I5042 Chronic combined systolic (congestive) and diastolic (congestive) heart failure: Secondary | ICD-10-CM | POA: Diagnosis not present

## 2022-06-19 NOTE — Progress Notes (Signed)
EPIC Encounter for ICM Monitoring  Patient Name: Dana Grant is a 72 y.o. female Date: 06/19/2022 Primary Care Physican: Julian Hy, MD Primary Cardiologist: [facility] Electrophysiologist: [employer] Bi-V Pacing: >99%          01/14/2022 Weight: unknown                                                            Spoke with husband per DPR and heart failure questions reviewed.  Transmission results reviewed.  He reports patient had some swelling in feet this past week but has now resolved.            CorVue thoracic impedance normal but was suggesting intermittent days with possible fluid accumulation from 1/23-2/8.   Prescribed:  Furosemide 20 mg take 1 tablet(s) (20 mg total) by mouth daily. Potassium 10 mEq take 1 tablet(s) (10 mEq total) by mouth daily.   Recommendations:  Recommendation to limit salt intake to 2000 mg daily and fluid intake to 64 oz daily.  Encouraged to call if experiencing any fluid symptoms.    Follow-up plan: ICM clinic phone appointment on 07/21/2022.   91 day device clinic remote transmission 07/03/2022.     EP/Cardiology Office Visits:  01/09/2023 with Dr Myles Gip.     Copy of ICM check sent to Dr. Myles Gip.  3 month ICM trend: 06/16/2022.    12-14 Month ICM trend:     Rosalene Billings, RN 06/19/2022 1:20 PM

## 2022-06-20 ENCOUNTER — Telehealth: Payer: Medicare PPO | Admitting: Internal Medicine

## 2022-07-03 ENCOUNTER — Ambulatory Visit (INDEPENDENT_AMBULATORY_CARE_PROVIDER_SITE_OTHER): Payer: Medicare PPO

## 2022-07-03 DIAGNOSIS — I428 Other cardiomyopathies: Secondary | ICD-10-CM

## 2022-07-03 LAB — CUP PACEART REMOTE DEVICE CHECK
Battery Remaining Longevity: 48 mo
Battery Remaining Percentage: 54 %
Battery Voltage: 2.93 V
Brady Statistic AP VP Percent: 1 %
Brady Statistic AP VS Percent: 0 %
Brady Statistic AS VP Percent: 99 %
Brady Statistic AS VS Percent: 1 %
Brady Statistic RA Percent Paced: 1 %
Date Time Interrogation Session: 20240229024850
HighPow Impedance: 45 Ohm
HighPow Impedance: 45 Ohm
Implantable Lead Connection Status: 753985
Implantable Lead Connection Status: 753985
Implantable Lead Location: 753859
Implantable Lead Location: 753860
Implantable Lead Model: 6947
Lead Channel Impedance Value: 390 Ohm
Lead Channel Impedance Value: 560 Ohm
Lead Channel Impedance Value: 960 Ohm
Lead Channel Pacing Threshold Amplitude: 0.5 V
Lead Channel Pacing Threshold Amplitude: 0.625 V
Lead Channel Pacing Threshold Amplitude: 1 V
Lead Channel Pacing Threshold Pulse Width: 0.5 ms
Lead Channel Pacing Threshold Pulse Width: 0.5 ms
Lead Channel Pacing Threshold Pulse Width: 0.5 ms
Lead Channel Sensing Intrinsic Amplitude: 2.8 mV
Lead Channel Sensing Intrinsic Amplitude: 8.3 mV
Lead Channel Setting Pacing Amplitude: 1.625
Lead Channel Setting Pacing Amplitude: 2 V
Lead Channel Setting Pacing Amplitude: 2 V
Lead Channel Setting Pacing Pulse Width: 0.5 ms
Lead Channel Setting Pacing Pulse Width: 0.5 ms
Lead Channel Setting Sensing Sensitivity: 0.5 mV
Pulse Gen Serial Number: 9895991

## 2022-07-21 ENCOUNTER — Ambulatory Visit: Payer: Medicare PPO | Attending: Cardiovascular Disease

## 2022-07-21 DIAGNOSIS — I5042 Chronic combined systolic (congestive) and diastolic (congestive) heart failure: Secondary | ICD-10-CM

## 2022-07-21 DIAGNOSIS — Z95810 Presence of automatic (implantable) cardiac defibrillator: Secondary | ICD-10-CM | POA: Diagnosis not present

## 2022-07-23 ENCOUNTER — Telehealth: Payer: Self-pay

## 2022-07-23 NOTE — Progress Notes (Signed)
EPIC Encounter for ICM Monitoring  Patient Name: Dana Grant is a 72 y.o. female Date: 07/23/2022 Primary Care Physican: Julian Hy, MD Primary Cardiologist: [facility] Electrophysiologist: [employer] Bi-V Pacing: >99%          01/14/2022 Weight: unknown  AT/AF Burden: 0%                                                            Attempted call to patient and unable to reach.   Transmission reviewed.  Pt hospitalized for 2 days with acute cystitis which may contribute to fluid accumulation if she received hydration during stay.           CorVue thoracic impedance  suggesting possible fluid accumulation starting 3/17.   Prescribed:  Furosemide 20 mg take 1 tablet(s) (20 mg total) by mouth daily. Potassium 10 mEq take 1 tablet(s) (10 mEq total) by mouth daily.   Labs: 07/22/2022 Creatinine 1.15, BUN 17, Potassium 4.3, Sodium 139, GFR 49  07/21/2022 Creatinine 0.98, BUN 14, Potassium 4.3, Sodium 140, GFR 60  07/20/2022 Creatinine 1.09, BUN 15, Potassium 4.4, Sodium 142, GFR 53  07/19/2022 Creatinine 1.39, BUN 18, Potassium 4.6, Sodium 135, GFR 39 10/31/2021 Creatinine 1.03, BUN 14, Potassium 3.9, Sodium 138, GFR 56  A complete set of results can be found in Results Review.  Recommendations:  Unable to reach.     Follow-up plan: ICM clinic phone appointment on 07/28/2022 to recheck fluid levels.   91 day device clinic remote transmission 10/02/2022.     EP/Cardiology Office Visits:  01/09/2023 with Dr Myles Gip.     Copy of ICM check sent to Dr. Myles Gip.  Will send copy to Dr Dellia Cloud for review and recommendation if patient is reached.    3 month ICM trend: 07/22/2022.    12-14 Month ICM trend:     Rosalene Billings, RN 07/23/2022 10:23 AM

## 2022-07-23 NOTE — Telephone Encounter (Signed)
Remote ICM transmission received.  Attempted call to patient regarding ICM remote transmission and person answering phone stated patient was not available. °

## 2022-07-28 ENCOUNTER — Ambulatory Visit (INDEPENDENT_AMBULATORY_CARE_PROVIDER_SITE_OTHER): Payer: Medicare PPO

## 2022-07-28 DIAGNOSIS — I5042 Chronic combined systolic (congestive) and diastolic (congestive) heart failure: Secondary | ICD-10-CM

## 2022-07-28 DIAGNOSIS — Z95810 Presence of automatic (implantable) cardiac defibrillator: Secondary | ICD-10-CM

## 2022-07-29 NOTE — Progress Notes (Signed)
EPIC Encounter for ICM Monitoring  Patient Name: Dana Grant is a 72 y.o. female Date: 07/29/2022 Primary Care Physican: Julian Hy, MD Primary Cardiologist: [facility] Electrophysiologist: [employer] Bi-V Pacing: >99%          01/14/2022 Weight: unknown   AT/AF Burden: 0%                                                            Transmission reviewed.            CorVue thoracic impedance  suggesting fluid levels returned to normal.   Prescribed:  Furosemide 20 mg take 1 tablet(s) (20 mg total) by mouth daily. Potassium 10 mEq take 1 tablet(s) (10 mEq total) by mouth daily.   Labs: 07/22/2022 Creatinine 1.15, BUN 17, Potassium 4.3, Sodium 139, GFR 49  07/21/2022 Creatinine 0.98, BUN 14, Potassium 4.3, Sodium 140, GFR 60  07/20/2022 Creatinine 1.09, BUN 15, Potassium 4.4, Sodium 142, GFR 53  07/19/2022 Creatinine 1.39, BUN 18, Potassium 4.6, Sodium 135, GFR 39 10/31/2021 Creatinine 1.03, BUN 14, Potassium 3.9, Sodium 138, GFR 56  A complete set of results can be found in Results Review.   Recommendations:  No changes.   Follow-up plan: ICM clinic phone appointment on 08/25/2022.   91 day device clinic remote transmission 10/02/2022.     EP/Cardiology Office Visits:  01/09/2023 with Dr Myles Gip.     Copy of ICM check sent to Dr. Myles Gip.   3 month ICM trend: 07/28/2022.    12-14 Month ICM trend:     Rosalene Billings, RN 07/29/2022 2:18 PM

## 2022-08-01 NOTE — Progress Notes (Signed)
Remote ICD transmission.   

## 2022-08-25 ENCOUNTER — Ambulatory Visit: Payer: Medicare PPO | Attending: Cardiovascular Disease

## 2022-08-25 DIAGNOSIS — I5042 Chronic combined systolic (congestive) and diastolic (congestive) heart failure: Secondary | ICD-10-CM

## 2022-08-25 DIAGNOSIS — Z95810 Presence of automatic (implantable) cardiac defibrillator: Secondary | ICD-10-CM

## 2022-08-29 NOTE — Progress Notes (Signed)
EPIC Encounter for ICM Monitoring  Patient Name: Dana Grant is a 72 y.o. female Date: 08/29/2022 Primary Care Physican: Vick Frees, MD Primary Cardiologist: [facility] Electrophysiologist: [employer] Bi-V Pacing: >99%          01/14/2022 Weight: unknown   AT/AF Burden: 0%                                                            Transmission reviewed.            CorVue thoracic impedance  suggesting normal fluid levels.    Prescribed:  Furosemide 20 mg take 1 tablet(s) (20 mg total) by mouth daily. Potassium 10 mEq take 1 tablet(s) (10 mEq total) by mouth daily.   Labs: 07/22/2022 Creatinine 1.15, BUN 17, Potassium 4.3, Sodium 139, GFR 49  07/21/2022 Creatinine 0.98, BUN 14, Potassium 4.3, Sodium 140, GFR 60  07/20/2022 Creatinine 1.09, BUN 15, Potassium 4.4, Sodium 142, GFR 53  07/19/2022 Creatinine 1.39, BUN 18, Potassium 4.6, Sodium 135, GFR 39 10/31/2021 Creatinine 1.03, BUN 14, Potassium 3.9, Sodium 138, GFR 56  A complete set of results can be found in Results Review.   Recommendations:  No changes.   Follow-up plan: ICM clinic phone appointment on 09/30/2022.   91 day device clinic remote transmission 10/02/2022.     EP/Cardiology Office Visits:  01/09/2023 with Dr Nelly Laurence.     Copy of ICM check sent to Dr. Nelly Laurence.  3 month ICM trend: 08/25/2022.    12-14 Month ICM trend:     Karie Soda, RN 08/29/2022 3:19 PM

## 2022-09-30 ENCOUNTER — Ambulatory Visit: Payer: Medicare PPO | Attending: Cardiovascular Disease

## 2022-09-30 DIAGNOSIS — I5042 Chronic combined systolic (congestive) and diastolic (congestive) heart failure: Secondary | ICD-10-CM | POA: Diagnosis not present

## 2022-09-30 DIAGNOSIS — Z95810 Presence of automatic (implantable) cardiac defibrillator: Secondary | ICD-10-CM | POA: Diagnosis not present

## 2022-10-03 NOTE — Progress Notes (Signed)
EPIC Encounter for ICM Monitoring  Patient Name: Dana Grant is a 72 y.o. female Date: 10/03/2022 Primary Care Physican: Vick Frees, MD Primary Cardiologist: [facility] Electrophysiologist: [employer] Bi-V Pacing: >99%          01/14/2022 Weight: unknown   AT/AF Burden: 0%                                                            Spoke with patient and heart failure questions reviewed.  Transmission results reviewed.  Pt asymptomatic for fluid accumulation.  Reports feeling well at this time and voices no complaints.            CorVue thoracic impedance  suggesting normal fluid levels.    Prescribed:  Furosemide 20 mg take 1 tablet(s) (20 mg total) by mouth daily. Potassium 10 mEq take 1 tablet(s) (10 mEq total) by mouth daily.   Labs: 07/22/2022 Creatinine 1.15, BUN 17, Potassium 4.3, Sodium 139, GFR 49  07/21/2022 Creatinine 0.98, BUN 14, Potassium 4.3, Sodium 140, GFR 60  07/20/2022 Creatinine 1.09, BUN 15, Potassium 4.4, Sodium 142, GFR 53  07/19/2022 Creatinine 1.39, BUN 18, Potassium 4.6, Sodium 135, GFR 39 10/31/2021 Creatinine 1.03, BUN 14, Potassium 3.9, Sodium 138, GFR 56  A complete set of results can be found in Results Review.   Recommendations:  No changes and encouraged to call if experiencing any fluid symptoms.   Follow-up plan: ICM clinic phone appointment on 11/03/2022.   91 day device clinic remote transmission 01/01/2023.     EP/Cardiology Office Visits:  01/09/2023 with Dr Nelly Laurence.     Copy of ICM check sent to Dr. Nelly Laurence.  3 month ICM trend: 09/30/2022.    12-14 Month ICM trend:     Karie Soda, RN 10/03/2022 12:40 PM

## 2022-11-03 ENCOUNTER — Ambulatory Visit: Payer: Medicare PPO | Attending: Cardiovascular Disease

## 2022-11-03 DIAGNOSIS — I5042 Chronic combined systolic (congestive) and diastolic (congestive) heart failure: Secondary | ICD-10-CM | POA: Diagnosis not present

## 2022-11-03 DIAGNOSIS — Z95810 Presence of automatic (implantable) cardiac defibrillator: Secondary | ICD-10-CM

## 2022-11-07 NOTE — Progress Notes (Signed)
EPIC Encounter for ICM Monitoring  Patient Name: Dana Grant is a 72 y.o. female Date: 11/07/2022 Primary Care Physican: Vick Frees, MD Primary Cardiologist: [facility] Electrophysiologist: [employer] Bi-V Pacing: >99%          10/28/2022 Office Weight: 198 lbs   AT/AF Burden: 0%                                                            Transmission results reviewed.            CorVue thoracic impedance  suggesting normal fluid levels.    Prescribed:  Furosemide 20 mg take 1 tablet(s) (20 mg total) by mouth daily. Potassium 10 mEq take 1 tablet(s) (10 mEq total) by mouth daily.   Labs: 08/19/2022 Creatinine 1.26, BUN 28, Potassium 4.3, Sodium 139, GFR 22.2 07/22/2022 Creatinine 1.15, BUN 17, Potassium 4.3, Sodium 139, GFR 49  07/21/2022 Creatinine 0.98, BUN 14, Potassium 4.3, Sodium 140, GFR 60  07/20/2022 Creatinine 1.09, BUN 15, Potassium 4.4, Sodium 142, GFR 53  07/19/2022 Creatinine 1.39, BUN 18, Potassium 4.6, Sodium 135, GFR 39 10/31/2021 Creatinine 1.03, BUN 14, Potassium 3.9, Sodium 138, GFR 56  A complete set of results can be found in Results Review.   Recommendations:  No changes.   Follow-up plan: ICM clinic phone appointment on 12/08/2022.   91 day device clinic remote transmission 01/01/2023.     EP/Cardiology Office Visits:  01/09/2023 with Dr Nelly Laurence.     Copy of ICM check sent to Dr. Nelly Laurence.   3 month ICM trend: 11/03/2022.    12-14 Month ICM trend:     Karie Soda, RN 11/07/2022 2:53 PM

## 2022-12-08 ENCOUNTER — Ambulatory Visit: Payer: Medicare PPO | Attending: Cardiovascular Disease

## 2022-12-08 DIAGNOSIS — I5042 Chronic combined systolic (congestive) and diastolic (congestive) heart failure: Secondary | ICD-10-CM | POA: Diagnosis not present

## 2022-12-08 DIAGNOSIS — Z95810 Presence of automatic (implantable) cardiac defibrillator: Secondary | ICD-10-CM

## 2022-12-11 ENCOUNTER — Telehealth: Payer: Self-pay

## 2022-12-11 NOTE — Telephone Encounter (Signed)
Remote ICM transmission received.  Attempted call to patient regarding ICM remote transmission and unable to reach. 

## 2022-12-11 NOTE — Progress Notes (Signed)
EPIC Encounter for ICM Monitoring  Patient Name: Dana Grant is a 72 y.o. female Date: 12/11/2022 Primary Care Physican: Vick Frees, MD Primary Cardiologist: [facility] Electrophysiologist: [employer] Bi-V Pacing: >99%          10/28/2022 Office Weight: 198 lbs   AT/AF Burden: 0%                                                            Attempted call to patient and unable to reach.  Person answering phone stated she was sleeping.           CorVue thoracic impedance  suggesting normal fluid levels.    Prescribed:  Furosemide 20 mg take 1 tablet(s) (20 mg total) by mouth daily. Potassium 10 mEq take 1 tablet(s) (10 mEq total) by mouth daily.   Labs: 08/19/2022 Creatinine 1.26, BUN 28, Potassium 4.3, Sodium 139, GFR 22.2 07/22/2022 Creatinine 1.15, BUN 17, Potassium 4.3, Sodium 139, GFR 49  07/21/2022 Creatinine 0.98, BUN 14, Potassium 4.3, Sodium 140, GFR 60  07/20/2022 Creatinine 1.09, BUN 15, Potassium 4.4, Sodium 142, GFR 53  07/19/2022 Creatinine 1.39, BUN 18, Potassium 4.6, Sodium 135, GFR 39 10/31/2021 Creatinine 1.03, BUN 14, Potassium 3.9, Sodium 138, GFR 56  A complete set of results can be found in Results Review.   Recommendations: Unable to reach.     Follow-up plan: ICM clinic phone appointment on 01/12/2023.   91 day device clinic remote transmission 01/01/2023.     EP/Cardiology Office Visits:  01/09/2023 with Dr Nelly Laurence.     Copy of ICM check sent to Dr. Nelly Laurence.   3 month ICM trend: 12/08/2022.    12-14 Month ICM trend:     Karie Soda, RN 12/11/2022 2:17 PM

## 2023-01-01 ENCOUNTER — Ambulatory Visit (INDEPENDENT_AMBULATORY_CARE_PROVIDER_SITE_OTHER): Payer: Medicare PPO

## 2023-01-01 DIAGNOSIS — I428 Other cardiomyopathies: Secondary | ICD-10-CM | POA: Diagnosis not present

## 2023-01-01 LAB — CUP PACEART REMOTE DEVICE CHECK
Battery Remaining Longevity: 43 mo
Battery Remaining Percentage: 48 %
Battery Voltage: 2.93 V
Brady Statistic AP VP Percent: 1 %
Brady Statistic AP VS Percent: 0 %
Brady Statistic AS VP Percent: 99 %
Brady Statistic AS VS Percent: 1 %
Brady Statistic RA Percent Paced: 1 %
Date Time Interrogation Session: 20240829024845
HighPow Impedance: 48 Ohm
HighPow Impedance: 48 Ohm
Implantable Lead Connection Status: 753985
Implantable Lead Connection Status: 753985
Implantable Lead Location: 753859
Implantable Lead Location: 753860
Implantable Lead Model: 6947
Lead Channel Impedance Value: 1025 Ohm
Lead Channel Impedance Value: 410 Ohm
Lead Channel Impedance Value: 590 Ohm
Lead Channel Pacing Threshold Amplitude: 0.5 V
Lead Channel Pacing Threshold Amplitude: 0.625 V
Lead Channel Pacing Threshold Amplitude: 1.125 V
Lead Channel Pacing Threshold Pulse Width: 0.5 ms
Lead Channel Pacing Threshold Pulse Width: 0.5 ms
Lead Channel Pacing Threshold Pulse Width: 0.5 ms
Lead Channel Sensing Intrinsic Amplitude: 3.1 mV
Lead Channel Sensing Intrinsic Amplitude: 8.3 mV
Lead Channel Setting Pacing Amplitude: 1.5 V
Lead Channel Setting Pacing Amplitude: 2 V
Lead Channel Setting Pacing Amplitude: 2 V
Lead Channel Setting Pacing Pulse Width: 0.5 ms
Lead Channel Setting Pacing Pulse Width: 0.5 ms
Lead Channel Setting Sensing Sensitivity: 0.5 mV
Pulse Gen Serial Number: 9895991

## 2023-01-09 ENCOUNTER — Ambulatory Visit: Payer: Medicare PPO | Attending: Cardiovascular Disease | Admitting: Cardiovascular Disease

## 2023-01-09 ENCOUNTER — Encounter: Payer: Self-pay | Admitting: Cardiovascular Disease

## 2023-01-09 VITALS — BP 104/68 | HR 100 | Ht 66.0 in | Wt 205.0 lb

## 2023-01-09 DIAGNOSIS — I5042 Chronic combined systolic (congestive) and diastolic (congestive) heart failure: Secondary | ICD-10-CM | POA: Diagnosis not present

## 2023-01-09 LAB — CUP PACEART INCLINIC DEVICE CHECK
Battery Remaining Longevity: 43 mo
Brady Statistic RA Percent Paced: 0.04 %
Brady Statistic RV Percent Paced: 99.99 %
Date Time Interrogation Session: 20240906141110
HighPow Impedance: 52.0678
Implantable Lead Connection Status: 753985
Implantable Lead Connection Status: 753985
Implantable Lead Connection Status: 753985
Implantable Lead Implant Date: 20120905
Implantable Lead Implant Date: 20120905
Implantable Lead Implant Date: 20200611
Implantable Lead Location: 753858
Implantable Lead Location: 753859
Implantable Lead Location: 753860
Implantable Lead Model: 6947
Implantable Pulse Generator Implant Date: 20200611
Lead Channel Impedance Value: 1187.5 Ohm
Lead Channel Impedance Value: 387.5 Ohm
Lead Channel Impedance Value: 587.5 Ohm
Lead Channel Pacing Threshold Amplitude: 0.5 V
Lead Channel Pacing Threshold Amplitude: 0.5 V
Lead Channel Pacing Threshold Amplitude: 0.5 V
Lead Channel Pacing Threshold Amplitude: 0.5 V
Lead Channel Pacing Threshold Amplitude: 1 V
Lead Channel Pacing Threshold Amplitude: 1 V
Lead Channel Pacing Threshold Pulse Width: 0.5 ms
Lead Channel Pacing Threshold Pulse Width: 0.5 ms
Lead Channel Pacing Threshold Pulse Width: 0.5 ms
Lead Channel Pacing Threshold Pulse Width: 0.5 ms
Lead Channel Pacing Threshold Pulse Width: 0.5 ms
Lead Channel Pacing Threshold Pulse Width: 0.5 ms
Lead Channel Sensing Intrinsic Amplitude: 2.5 mV
Lead Channel Sensing Intrinsic Amplitude: 8.3 mV
Lead Channel Setting Pacing Amplitude: 1.5 V
Lead Channel Setting Pacing Amplitude: 2 V
Lead Channel Setting Pacing Amplitude: 2 V
Lead Channel Setting Pacing Pulse Width: 0.5 ms
Lead Channel Setting Pacing Pulse Width: 0.5 ms
Lead Channel Setting Sensing Sensitivity: 0.5 mV
Pulse Gen Serial Number: 9895991

## 2023-01-09 NOTE — Progress Notes (Signed)
  Electrophysiology Office Note:    Date:  01/09/2023   ID:  [name], DOB 05/16/1950, MRN [id number]  PCP:  Vick Frees, MD   [facility] Providers Cardiologist:  Marjo Bicker, MD Electrophysiologist:  Maurice Small, MD     Referring MD: Vick Frees, MD   History of Present Illness:    Dana Grant is a 72 y.o. 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     She has a dual-chamber ICD placed in 2012.  She has a Medtronic RV lead and a [employer] tendril atrial lead.  The device was upgraded to a CRT and June 2020 with placement of a Saint Jude LV lead and Abbott generator.  She has a history of back issues and is wheelchair dependent.     she has no device related complaints -- no new tenderness, drainage, redness.   EKGs/Labs/Other Studies Reviewed Today:    Echocardiogram:  TTE March 25, 2022 EF 60 to 65%.  Rate 1 diastolic dysfunction.   Monitors:   Stress testing:   Advanced imaging:   Cardiac catherization   EKG:         Physical Exam:    VS:  BP 104/68   Pulse 100   Ht 5\' 6"  (1.676 m)   Wt 205 lb (93 kg)   SpO2 99%   BMI 33.09 kg/m     Wt Readings from Last 3 Encounters:  01/09/23 205 lb (93 kg)  12/27/21 220 lb (99.8 kg)  12/06/21 220 lb (99.8 kg)     GEN:  Well nourished, well developed in no acute distress CARDIAC: RRR, no murmurs, rubs, gallops RESPIRATORY:  Normal work of breathing MUSCULOSKELETAL: no edema    ASSESSMENT & PLAN:    Chronic systolic dysfunction, nonischemic cardiomyopathy Appears euvolemic and compensated today Continue carvedilol 25 mg, furosemide 20 mg; unable to tolerate ACE/ARB due to AKI Normal device function She is device-dependent today  History of LBBB 99% CRT pacing  HTN BP acceptable today    Signed, Maurice Small, MD  01/09/2023  10:49 AM    [facility]

## 2023-01-09 NOTE — Progress Notes (Signed)
Remote ICD transmission.   

## 2023-01-09 NOTE — Patient Instructions (Addendum)
Medication Instructions:  Continue all current medications.  Labwork: none  Testing/Procedures: none  Follow-Up: 1 year   Any Other Special Instructions Will Be Listed Below (If Applicable).  If you need a refill on your cardiac medications before your next appointment, please call your pharmacy.  

## 2023-01-12 ENCOUNTER — Ambulatory Visit: Payer: Medicare PPO | Attending: Cardiovascular Disease

## 2023-01-12 DIAGNOSIS — I5042 Chronic combined systolic (congestive) and diastolic (congestive) heart failure: Secondary | ICD-10-CM | POA: Diagnosis not present

## 2023-01-12 DIAGNOSIS — Z95810 Presence of automatic (implantable) cardiac defibrillator: Secondary | ICD-10-CM

## 2023-01-16 NOTE — Progress Notes (Signed)
EPIC Encounter for ICM Monitoring  Patient Name: Dana Grant is a 72 y.o. female Date: 01/16/2023 Primary Care Physican: Vick Frees, MD Primary Cardiologist: [facility] Electrophysiologist: Mealor Bi-V Pacing: >99%          10/28/2022 Office Weight: 198 lbs   AT/AF Burden: 0%                                                            Transmission reviewed.           CorVue thoracic impedance  suggesting normal fluid levels.    Prescribed:  Furosemide 20 mg take 1 tablet(s) (20 mg total) by mouth daily. Potassium 10 mEq take 1 tablet(s) (10 mEq total) by mouth daily.   Labs: 08/19/2022 Creatinine 1.26, BUN 28, Potassium 4.3, Sodium 139, GFR 22.2 07/22/2022 Creatinine 1.15, BUN 17, Potassium 4.3, Sodium 139, GFR 49  07/21/2022 Creatinine 0.98, BUN 14, Potassium 4.3, Sodium 140, GFR 60  07/20/2022 Creatinine 1.09, BUN 15, Potassium 4.4, Sodium 142, GFR 53  07/19/2022 Creatinine 1.39, BUN 18, Potassium 4.6, Sodium 135, GFR 39 10/31/2021 Creatinine 1.03, BUN 14, Potassium 3.9, Sodium 138, GFR 56  A complete set of results can be found in Results Review.   Recommendations:  No changes.      Follow-up plan: ICM clinic phone appointment on 02/16/2023.   91 day device clinic remote transmission 04/06/2023.     EP/Cardiology Office Visits:  02/05/2024 with Dr Nelly Laurence.     Copy of ICM check sent to Dr. Nelly Laurence.    3 month ICM trend: 01/12/2023.    12-14 Month ICM trend:     Karie Soda, RN 01/16/2023 2:19 PM

## 2023-02-16 ENCOUNTER — Ambulatory Visit: Payer: Medicare PPO | Attending: Cardiovascular Disease

## 2023-02-16 DIAGNOSIS — Z95810 Presence of automatic (implantable) cardiac defibrillator: Secondary | ICD-10-CM | POA: Diagnosis not present

## 2023-02-16 DIAGNOSIS — I5042 Chronic combined systolic (congestive) and diastolic (congestive) heart failure: Secondary | ICD-10-CM

## 2023-02-16 NOTE — Progress Notes (Signed)
EPIC Encounter for ICM Monitoring  Patient Name: Dana Grant is a 72 y.o. female Date: 02/16/2023 Primary Care Physican: Vick Frees, MD Primary Cardiologist: [facility] Electrophysiologist: Mealor Bi-V Pacing: >99%          10/28/2022 Office Weight: 198 lbs   AT/AF Burden: 0%                                                            Spoke with husband, Dana Grant per DPR on cell phone.  He stated patient is doing fine and does not have any fluid symptoms.  They do eat restaurant foods about 3 times a week and uses salt shaker at home.          CorVue thoracic impedance  suggesting possible fluid accumulation starting 10/12.    Prescribed:  Furosemide 20 mg take 1 tablet(s) (20 mg total) by mouth daily. Potassium 10 mEq take 1 tablet(s) (10 mEq total) by mouth daily.   Labs: 01/27/2023 Creatinine 1.22, BUN 15, Potassium 4.5, Sodium 141, GFR 46 08/19/2022 Creatinine 1.26, BUN 28, Potassium 4.3, Sodium 139, GFR 22.2 07/22/2022 Creatinine 1.15, BUN 17, Potassium 4.3, Sodium 139, GFR 49  07/21/2022 Creatinine 0.98, BUN 14, Potassium 4.3, Sodium 140, GFR 60  A complete set of results can be found in Results Review.   Recommendations:  Encouraged to decrease salt intake and avoid using salt shaker if possible.  Will recheck fluid levels in a week.   Follow-up plan: ICM clinic phone appointment on 02/23/2023 to recheck fluid levels.   91 day device clinic remote transmission 04/06/2023.     EP/Cardiology Office Visits:    02/05/2024 with Dr Nelly Laurence.   Copy of ICM check sent to Dr. Nelly Laurence.    3 month ICM trend: 02/16/2023.    12-14 Month ICM trend:     Karie Soda, RN 02/16/2023 2:18 PM

## 2023-02-23 ENCOUNTER — Ambulatory Visit: Payer: Medicare PPO | Attending: Cardiovascular Disease

## 2023-02-23 DIAGNOSIS — I5042 Chronic combined systolic (congestive) and diastolic (congestive) heart failure: Secondary | ICD-10-CM

## 2023-02-23 DIAGNOSIS — Z95810 Presence of automatic (implantable) cardiac defibrillator: Secondary | ICD-10-CM

## 2023-02-25 NOTE — Progress Notes (Unsigned)
EPIC Encounter for ICM Monitoring  Patient Name: Dana Grant is a 72 y.o. female Date: 02/25/2023 Primary Care Physican: Vick Frees, MD Primary Cardiologist: [facility] Electrophysiologist: Mealor Bi-V Pacing: >99%          10/28/2022 Office Weight: 198 lbs   AT/AF Burden: 0%                                                            Spoke with husband, [name] per DPR on cell phone.  He stated patient is doing fine and does not have any fluid symptoms.    Diet: Typically eats restaurant foods about 3 times a week and uses salt shaker at home.          CorVue thoracic impedance  suggesting fluid levels returned to normal.    Prescribed:  Furosemide 20 mg take 1 tablet(s) (20 mg total) by mouth daily. Potassium 10 mEq take 1 tablet(s) (10 mEq total) by mouth daily.   Labs: 01/27/2023 Creatinine 1.22, BUN 15, Potassium 4.5, Sodium 141, GFR 46 08/19/2022 Creatinine 1.26, BUN 28, Potassium 4.3, Sodium 139, GFR 22.2 07/22/2022 Creatinine 1.15, BUN 17, Potassium 4.3, Sodium 139, GFR 49  07/21/2022 Creatinine 0.98, BUN 14, Potassium 4.3, Sodium 140, GFR 60  A complete set of results can be found in Results Review.   Recommendations:  ***   Follow-up plan: ICM clinic phone appointment on 03/23/2023.   91 day device clinic remote transmission 04/06/2023.     EP/Cardiology Office Visits:    02/05/2024 with Dr Nelly Laurence.   Copy of ICM check sent to Dr. Nelly Laurence.     3 month ICM trend: 02/23/2023.    12-14 Month ICM trend:     Karie Soda, RN 02/25/2023 8:28 AM

## 2023-02-26 ENCOUNTER — Telehealth: Payer: Self-pay

## 2023-02-26 NOTE — Telephone Encounter (Signed)
Remote ICM transmission received.  Attempted call to patient regarding ICM remote transmission and person answering phone stated she was not available. ? ?

## 2023-03-25 NOTE — Progress Notes (Signed)
No ICM remote transmission received for 03/23/2023 and next ICM transmission scheduled for 04/07/2023.

## 2023-04-13 ENCOUNTER — Telehealth: Payer: Self-pay

## 2023-04-13 NOTE — Telephone Encounter (Signed)
Attempted call to patient. Left message stating device clinic is not received remote transmission due to the monitor is showing it is disconnected.  Left [employer] support number and to call if assistance is needed with monitor. ICM remote transmission rescheduled for 05/11/2023.

## 2023-05-11 ENCOUNTER — Ambulatory Visit: Payer: Medicare PPO | Attending: Cardiovascular Disease

## 2023-05-11 DIAGNOSIS — Z95810 Presence of automatic (implantable) cardiac defibrillator: Secondary | ICD-10-CM

## 2023-05-11 DIAGNOSIS — I5042 Chronic combined systolic (congestive) and diastolic (congestive) heart failure: Secondary | ICD-10-CM

## 2023-05-13 NOTE — Progress Notes (Signed)
 EPIC Encounter for ICM Monitoring  Patient Name: Dana Grant is a 73 y.o. female Date: 05/13/2023 Primary Care Physican: Rosalva Ade, MD Primary Cardiologist: [facility] Electrophysiologist: Mealor Bi-V Pacing: >99%          10/28/2022 Office Weight: 198 lbs 05/13/2023 Weight:  Unknown   AT/AF Burden: 0%                                                            Spoke with patient and heart failure questions reviewed.  Transmission results reviewed.  Pt asymptomatic for fluid accumulation but overall not feeling well.  Several hospitalizations in the last 2 months which may contribute to decreased impedance.       Diet: Typically eats restaurant foods about 3 times a week and uses salt shaker at home.          CorVue thoracic impedance  suggesting possible dryness starting 12/27 and trending back toward baseline.   Possible fluid accumulation from 12/9-12/22.   Prescribed:  Furosemide  20 mg take 1 tablet(s) (20 mg total) by mouth daily. Potassium 10 mEq take 1 tablet(s) (10 mEq total) by mouth daily.   Labs: 01/27/2023 Creatinine 1.22, BUN 15, Potassium 4.5, Sodium 141, GFR 46 08/19/2022 Creatinine 1.26, BUN 28, Potassium 4.3, Sodium 139, GFR 22.2 07/22/2022 Creatinine 1.15, BUN 17, Potassium 4.3, Sodium 139, GFR 49  07/21/2022 Creatinine 0.98, BUN 14, Potassium 4.3, Sodium 140, GFR 60  A complete set of results can be found in Results Review.   Recommendations:  Encouraged to drink 64 oz fluid daily to help maintain hydration.    Follow-up plan: ICM clinic phone appointment on 06/15/2023.   91 day device clinic remote transmission 07/06/2023.     EP/Cardiology Office Visits:    02/05/2024 with Dr Nancey.   Copy of ICM check sent to Dr. Nancey.    3 month ICM trend: 05/10/2023.    Mitzie GORMAN Garner, RN 05/13/2023 1:27 PM

## 2023-05-18 ENCOUNTER — Telehealth: Payer: Self-pay

## 2023-05-18 NOTE — Telephone Encounter (Signed)
 Appt scheduled

## 2023-05-18 NOTE — Telephone Encounter (Signed)
 Alert received from [employer] for new onset AF. Duration 4 hr 36 minutes. No OAC on MAR.  Patient contacted and denies any symptoms. Advised pt recommend AF clinic referral to discuss OAC. PT agreeable.

## 2023-05-21 ENCOUNTER — Encounter (HOSPITAL_COMMUNITY): Payer: Self-pay | Admitting: Physician Assistant

## 2023-05-21 ENCOUNTER — Ambulatory Visit (HOSPITAL_COMMUNITY)
Admission: RE | Admit: 2023-05-21 | Discharge: 2023-05-21 | Disposition: A | Discharge status: Home / Self Care | Payer: Medicare PPO | Source: Ambulatory Visit | Attending: Physician Assistant | Admitting: Physician Assistant

## 2023-05-21 VITALS — BP 116/70 | HR 103 | Ht 66.0 in | Wt 168.6 lb

## 2023-05-21 DIAGNOSIS — I447 Left bundle-branch block, unspecified: Secondary | ICD-10-CM | POA: Diagnosis not present

## 2023-05-21 DIAGNOSIS — I480 Paroxysmal atrial fibrillation: Secondary | ICD-10-CM | POA: Insufficient documentation

## 2023-05-21 DIAGNOSIS — Z79899 Other long term (current) drug therapy: Secondary | ICD-10-CM | POA: Diagnosis not present

## 2023-05-21 DIAGNOSIS — Z7901 Long term (current) use of anticoagulants: Secondary | ICD-10-CM | POA: Diagnosis not present

## 2023-05-21 DIAGNOSIS — I5032 Chronic diastolic (congestive) heart failure: Secondary | ICD-10-CM | POA: Diagnosis not present

## 2023-05-21 DIAGNOSIS — G4733 Obstructive sleep apnea (adult) (pediatric): Secondary | ICD-10-CM | POA: Diagnosis not present

## 2023-05-21 DIAGNOSIS — I110 Hypertensive heart disease with heart failure: Secondary | ICD-10-CM | POA: Diagnosis not present

## 2023-05-21 DIAGNOSIS — D6869 Other thrombophilia: Secondary | ICD-10-CM | POA: Diagnosis not present

## 2023-05-21 DIAGNOSIS — Z95810 Presence of automatic (implantable) cardiac defibrillator: Secondary | ICD-10-CM | POA: Insufficient documentation

## 2023-05-21 MED ORDER — APIXABAN 5 MG PO TABS: 5.0000 mg | ORAL_TABLET | Freq: Two times a day (BID) | ORAL | 3 refills | Status: DC

## 2023-05-21 NOTE — Patient Instructions (Signed)
Start Eliquis 5mg  twice a day  Beginning in 2025, the prescription drug law requires all Medicare prescription drug plans (Medicare Part D plans)--including both standalone Medicare prescription drug plans and Medicare Advantage plans with prescription drug coverage--to offer Part D enrollees the option to pay out-of-pocket prescription drug costs in the form of monthly payments instead of all at once at the pharmacy. This program, called the Medicare Prescription Payment Plan, will be helpful for people with high cost sharing earlier in the plan year by spreading out those expenses over the course of the plan year.  What is the Medicare Prescription Payment Plan? The Medicare Prescription Payment Plan is a new payment option created under the Inflation Reduction Act that requires Part D plan sponsors to provide their enrollees with the option to pay out-of-pocket prescription drug costs in the form of monthly payments over the course of the plan year instead of all at once to the pharmacy. The program begins on May 06, 2023.  Program participants will pay $0 to the pharmacy for covered Part D drugs, and Part D plan sponsors will then bill program participants monthly for any cost sharing they incur while in the program. Pharmacies will be paid in full by the Part D sponsor in accordance with Part D prompt payment requirements  If there are issues with costs of your medications with new medicare changes ---call your DRUG INSURANCE to discuss payment plan to reduce your monthly costs.

## 2023-05-21 NOTE — Progress Notes (Signed)
Primary Care Physician: Vick Frees, MD Primary Cardiologist: Marjo Bicker, MD Electrophysiologist: Maurice Small, MD  Referring Physician: Device clinic/Dr Mealor   Dana Grant is a 73 y.o. female with a history of HFimpEF, OSA, LBBB s/p CRT-D, HTN, atrial fibrillation who presents for follow up in the [facility].  The device clinic received an alert 05/18/23 for new onset afib with a 4.5 hour episode. Patient was unaware of her arrhythmia. There were no specific triggers that they could identify.   On follow up today, patient reports that she feels well today. She is in SR. No history of bleeding issues.   Today, she denies symptoms of palpitations, chest pain, shortness of breath, orthopnea, PND, lower extremity edema, dizziness, presyncope, syncope, snoring, daytime somnolence, bleeding, or neurologic sequela. The patient is tolerating medications without difficulties and is otherwise without complaint today.    Atrial Fibrillation Risk Factors:  she does have symptoms or diagnosis of sleep apnea. she does not have a history of rheumatic fever.   Atrial Fibrillation Management history:  Previous antiarrhythmic drugs: none Previous cardioversions: none Previous ablations: none Anticoagulation history: none  ROS- All systems are reviewed and negative except as per the HPI above.  Past Medical History:  Diagnosis Date   AICD (automatic cardioverter/defibrillator) present    Anxiety    Anxiety state 07/18/2019   ICD10 Conversion   Back pain    BBB (bundle branch block)    BMI 39.0-39.9,adult    Breast mass 03/01/1999   CAD (coronary artery disease)    Cardiopathy    CHF (congestive heart failure) (HCC)    Chronic ulcer of other specified site 07/18/2019   Complication of anesthesia    Yes N & V   DM (diabetes mellitus) (HCC)    Dyslipidemia    Essential hypertension 09/02/1999   ICD10 Conversion   Gastroparesis  07/18/2019   History of hiatal hernia    Hyperlipidemia, mixed 09/02/1999   Hypertension    Hypothyroidism 07/18/2019   ICD10 Conversion   Left ventricular systolic dysfunction    Major depressive disorder, recurrent episode, moderate (HCC) 07/18/2019   Mastodynia 07/18/2019   Migraine    Myocardial infarction (HCC) 2011   Nonischemic cardiomyopathy (HCC)    Obesity 03/01/1999   ICD10 Conversion   OSA on CPAP    Other dyspnea and respiratory abnormality 07/18/2019   PONV (postoperative nausea and vomiting)    Proteinuria 07/18/2019   Spondylosis    Type II or unspecified type diabetes mellitus without mention of complication, not stated as uncontrolled 07/18/2019   Vitamin B12 deficiency     Current Outpatient Medications  Medication Sig Dispense Refill   acetaminophen (TYLENOL) 500 MG tablet Take 2 tablets (1,000 mg total) by mouth every 8 (eight) hours. 30 tablet 0   ascorbic acid (VITAMIN C) 500 MG tablet Take 500 mg by mouth daily.     atorvastatin (LIPITOR) 20 MG tablet Take 20 mg by mouth daily.     bacitracin 500 UNIT/GM ointment Apply 1 Application topically every Monday, Wednesday, and Friday.     clotrimazole-betamethasone (LOTRISONE) cream Apply 1 Application topically 2 (two) times daily.     DROPLET PEN NEEDLES 31G X 8 MM MISC      furosemide (LASIX) 20 MG tablet Take 1 tablet by mouth daily.     gabapentin (NEURONTIN) 400 MG capsule Take 400 mg by mouth 3 (three) times daily.     Glucagon HCl (GLUCAGON EMERGENCY)  1 MG/ML SOLR Inject as directed.     insulin degludec (TRESIBA) 100 UNIT/ML FlexTouch Pen Inject into the skin.     levothyroxine (SYNTHROID) 100 MCG tablet Take 100 mcg by mouth daily before breakfast.     lidocaine (LIDODERM) 5 % Place onto the skin.     metoprolol tartrate (LOPRESSOR) 25 MG tablet Take 25 mg by mouth 2 (two) times daily.     naloxone (NARCAN) nasal spray 4 mg/0.1 mL Place 1 spray into the nose.     NYAMYC powder Apply topically.      ondansetron (ZOFRAN) 4 MG tablet Take 4 mg by mouth every 6 (six) hours as needed for nausea or vomiting.     pantoprazole (PROTONIX) 40 MG tablet Take 1 tablet (40 mg total) by mouth daily at 12 noon.     polyethylene glycol (MIRALAX / GLYCOLAX) 17 g packet Take 17 g by mouth daily. 14 each 0   Semaglutide,0.25 or 0.5MG /DOS, (OZEMPIC, 0.25 OR 0.5 MG/DOSE,) 2 MG/3ML SOPN Inject into the skin.     senna-docusate (SENOKOT-S) 8.6-50 MG tablet Take by mouth.     TRINTELLIX 10 MG TABS tablet Take 10 mg by mouth daily.     Vitamin D, Ergocalciferol, (DRISDOL) 1.25 MG (50000 UNIT) CAPS capsule Take 50,000 Units by mouth every 7 (seven) days.     No current facility-administered medications for this encounter.    Physical Exam: BP 116/70   Pulse (!) 103   Ht 5\' 6"  (1.676 m)   Wt 76.5 kg   BMI 27.21 kg/m   GEN: Well nourished, well developed in no acute distress NECK: No JVD; No carotid bruits CARDIAC: Regular rate and rhythm, no murmurs, rubs, gallops RESPIRATORY:  Clear to auscultation without rales, wheezing or rhonchi  ABDOMEN: Soft, non-tender, non-distended EXTREMITIES:  No edema; No deformity   Wt Readings from Last 3 Encounters:  05/21/23 76.5 kg  01/09/23 93 kg  12/27/21 99.8 kg     EKG today demonstrates  A sense V paced rhythm Vent. rate 103 BPM PR interval 134 ms QRS duration 164 ms QT/QTcB 426/558 ms   Echo 03/25/22 demonstrated   1. Left ventricular ejection fraction, by estimation, is 60 to 65%. The  left ventricle has normal function. Left ventricular endocardial border  not optimally defined to evaluate regional wall motion. Left ventricular  diastolic parameters are consistent with Grade I diastolic dysfunction (impaired relaxation). The average left ventricular global longitudinal strain is -19.7 %.   2. Right ventricular systolic function was not well visualized. The right  ventricular size is not well visualized.   3. The mitral valve was not well  visualized. No evidence of mitral valve  regurgitation. No evidence of mitral stenosis.   4. The aortic valve was not well visualized. Aortic valve regurgitation  is trivial. No aortic stenosis is present.   Comparison(s): Prior images unable to be directly viewed, comparison made by report only. No significant change from prior study.    CHA2DS2-VASc Score = 4  The patient's score is based upon: CHF History: 1 HTN History: 1 Diabetes History: 0 Stroke History: 0 Vascular Disease History: 0 Age Score: 1 Gender Score: 1       ASSESSMENT AND PLAN: Paroxysmal Atrial Fibrillation (ICD10:  I48.0) The patient's CHA2DS2-VASc score is 4, indicating a 4.8% annual risk of stroke.   General education about afib provided and questions answered. We also discussed her stroke risk and the risks and benefits of anticoagulation. Will start Eliquis  5 mg BID for stroke prevention. Will check cbc at follow up Continue Lopressor 25 mg BID  Secondary Hypercoagulable State (ICD10:  D68.69) The patient is at significant risk for stroke/thromboembolism based upon her CHA2DS2-VASc Score of 4.  Start Apixaban (Eliquis).   HTN Stable on current regimen  OSA  Patient did not tolerate CPAP.  HFimpEF S/p CRT-D EF 60-65% Fluid status appears stable   Follow up in the AF clinic in one month.        Jorja Loa PA-C Afib Clinic [facility] [address] [place], [place] [address] [phone]

## 2023-06-15 ENCOUNTER — Ambulatory Visit: Payer: Medicare PPO | Attending: Cardiovascular Disease

## 2023-06-15 DIAGNOSIS — Z95810 Presence of automatic (implantable) cardiac defibrillator: Secondary | ICD-10-CM

## 2023-06-15 DIAGNOSIS — I5042 Chronic combined systolic (congestive) and diastolic (congestive) heart failure: Secondary | ICD-10-CM | POA: Diagnosis not present

## 2023-06-19 NOTE — Progress Notes (Signed)
EPIC Encounter for ICM Monitoring  Patient Name: Dana Grant is a 73 y.o. female Date: 06/19/2023 Primary Care Physican: Vick Frees, MD Primary Cardiologist: [facility] Electrophysiologist: Mealor Bi-V Pacing: >99%          05/21/2023 Office Weight:  168 lbs    AT/AF Burden: <1% (taking Eliquis)                                                    Spoke with patient and heart failure questions reviewed.  Transmission results reviewed.  Pt asymptomatic for fluid accumulation.  She did not have any fluid symptoms during decreased impedance.      Diet: Typically eats restaurant foods about 3 times a week and uses salt shaker at home.          CorVue thoracic impedance  suggesting possible fluid accumulation from 1/13-2/3 and returned to normal 2/4.   Prescribed:  Furosemide 20 mg take 1 tablet(s) (20 mg total) by mouth daily.  06/19/2023 Husband manages pills and she is not sure what meds she is taking.  Potassium 10 mEq take 1 tablet(s) (10 mEq total) by mouth daily.   Labs: 01/27/2023 Creatinine 1.22, BUN 15, Potassium 4.5, Sodium 141, GFR 46 08/19/2022 Creatinine 1.26, BUN 28, Potassium 4.3, Sodium 139, GFR 22.2 07/22/2022 Creatinine 1.15, BUN 17, Potassium 4.3, Sodium 139, GFR 49  07/21/2022 Creatinine 0.98, BUN 14, Potassium 4.3, Sodium 140, GFR 60  A complete set of results can be found in Results Review.   Recommendations:  No changes and encouraged to call if experiencing any fluid symptoms.   Follow-up plan: ICM clinic phone appointment on 07/20/2023.   91 day device clinic remote transmission 07/06/2023.     EP/Cardiology Office Visits:  02/05/2024 with Dr Nelly Laurence.   Copy of ICM check sent to Dr. Nelly Laurence.   3 month ICM trend: 06/15/2023.    12-14 Month ICM trend:     Karie Soda, RN 06/19/2023 8:53 AM

## 2023-06-25 ENCOUNTER — Ambulatory Visit (HOSPITAL_COMMUNITY): Payer: Medicare PPO | Admitting: Physician Assistant

## 2023-07-06 ENCOUNTER — Ambulatory Visit: Payer: Medicare PPO

## 2023-07-06 DIAGNOSIS — I428 Other cardiomyopathies: Secondary | ICD-10-CM

## 2023-07-06 DIAGNOSIS — I5022 Chronic systolic (congestive) heart failure: Secondary | ICD-10-CM

## 2023-07-06 LAB — CUP PACEART REMOTE DEVICE CHECK
Battery Remaining Longevity: 37 mo
Battery Remaining Percentage: 41 %
Battery Voltage: 2.92 V
Brady Statistic AP VP Percent: 1 %
Brady Statistic AP VS Percent: 0 %
Brady Statistic AS VP Percent: 99 %
Brady Statistic AS VS Percent: 1 %
Brady Statistic RA Percent Paced: 1 %
Date Time Interrogation Session: 20250303045559
HighPow Impedance: 46 Ohm
HighPow Impedance: 46 Ohm
Implantable Lead Connection Status: 753985
Implantable Lead Connection Status: 753985
Implantable Lead Connection Status: 753985
Implantable Lead Implant Date: 20120905
Implantable Lead Implant Date: 20120905
Implantable Lead Implant Date: 20200611
Implantable Lead Location: 753858
Implantable Lead Location: 753859
Implantable Lead Location: 753860
Implantable Lead Model: 6947
Implantable Pulse Generator Implant Date: 20200611
Lead Channel Impedance Value: 410 Ohm
Lead Channel Impedance Value: 580 Ohm
Lead Channel Impedance Value: 980 Ohm
Lead Channel Pacing Threshold Amplitude: 0.5 V
Lead Channel Pacing Threshold Amplitude: 0.5 V
Lead Channel Pacing Threshold Amplitude: 1.125 V
Lead Channel Pacing Threshold Pulse Width: 0.5 ms
Lead Channel Pacing Threshold Pulse Width: 0.5 ms
Lead Channel Pacing Threshold Pulse Width: 0.5 ms
Lead Channel Sensing Intrinsic Amplitude: 2.7 mV
Lead Channel Sensing Intrinsic Amplitude: 8.3 mV
Lead Channel Setting Pacing Amplitude: 1.5 V
Lead Channel Setting Pacing Amplitude: 2 V
Lead Channel Setting Pacing Amplitude: 2 V
Lead Channel Setting Pacing Pulse Width: 0.5 ms
Lead Channel Setting Pacing Pulse Width: 0.5 ms
Lead Channel Setting Sensing Sensitivity: 0.5 mV
Pulse Gen Serial Number: 9895991

## 2023-07-08 ENCOUNTER — Encounter: Payer: Self-pay | Admitting: Cardiovascular Disease

## 2023-07-20 ENCOUNTER — Ambulatory Visit: Payer: Medicare PPO | Attending: Cardiovascular Disease

## 2023-07-20 DIAGNOSIS — I5042 Chronic combined systolic (congestive) and diastolic (congestive) heart failure: Secondary | ICD-10-CM

## 2023-07-20 DIAGNOSIS — Z95810 Presence of automatic (implantable) cardiac defibrillator: Secondary | ICD-10-CM | POA: Diagnosis not present

## 2023-07-22 ENCOUNTER — Telehealth: Payer: Self-pay

## 2023-07-22 NOTE — Telephone Encounter (Signed)
 Remote ICM transmission received.  Attempted call to patient regarding ICM remote transmission and left detailed message per DPR.  Left ICM phone number and advised to return call for any fluid symptoms or questions. Next ICM remote transmission scheduled 08/24/2023.

## 2023-07-22 NOTE — Progress Notes (Signed)
 EPIC Encounter for ICM Monitoring  Patient Name: Dana Grant is a 73 y.o. female Date: 07/22/2023 Primary Care Physican: Vick Frees, MD Primary Cardiologist: [facility] Electrophysiologist: Mealor Bi-V Pacing: >99%          05/21/2023 Office Weight:  168 lbs    AT/AF Burden: <1% (taking Eliquis)                                                    Attempted call to patient and unable to reach.  Left detailed message per DPR regarding transmission.  Transmission results reviewed.    Diet: Typically eats restaurant foods about 3 times a week and uses salt shaker at home.          CorVue thoracic impedance  suggesting normal fluid levels within the last month.   Prescribed:  Furosemide 20 mg take 1 tablet(s) (20 mg total) by mouth daily.  06/19/2023 Husband manages pills and she is not sure what meds she is taking.  Potassium 10 mEq take 1 tablet(s) (10 mEq total) by mouth daily.   Labs: 01/27/2023 Creatinine 1.22, BUN 15, Potassium 4.5, Sodium 141, GFR 46 08/19/2022 Creatinine 1.26, BUN 28, Potassium 4.3, Sodium 139, GFR 22.2 07/22/2022 Creatinine 1.15, BUN 17, Potassium 4.3, Sodium 139, GFR 49  07/21/2022 Creatinine 0.98, BUN 14, Potassium 4.3, Sodium 140, GFR 60  A complete set of results can be found in Results Review.   Recommendations:  Left voice mail with ICM number and encouraged to call if experiencing any fluid symptoms.   Follow-up plan: ICM clinic phone appointment on 08/24/2023.   91 day device clinic remote transmission 10/05/2023.     EP/Cardiology Office Visits:  02/05/2024 with Dr Nelly Laurence.   Copy of ICM check sent to Dr. Nelly Laurence.   3 month ICM trend: 07/20/2023.    12-14 Month ICM trend:     Karie Soda, RN 07/22/2023 10:18 AM

## 2023-08-07 NOTE — Progress Notes (Signed)
 Remote [employer] transmission.

## 2023-08-24 ENCOUNTER — Ambulatory Visit: Attending: Cardiovascular Disease

## 2023-08-24 DIAGNOSIS — I5042 Chronic combined systolic (congestive) and diastolic (congestive) heart failure: Secondary | ICD-10-CM

## 2023-08-24 DIAGNOSIS — Z95810 Presence of automatic (implantable) cardiac defibrillator: Secondary | ICD-10-CM

## 2023-08-26 ENCOUNTER — Telehealth: Payer: Self-pay

## 2023-08-26 NOTE — Telephone Encounter (Signed)
 Remote ICM transmission received.  Attempted call to patient regarding ICM remote transmission and no answer.

## 2023-08-26 NOTE — Progress Notes (Signed)
 EPIC Encounter for ICM Monitoring  Patient Name: Dana Grant is a 73 y.o. female Date: 08/26/2023 Primary Care Physican: Corwin Dings, MD Primary Cardiologist: [facility] Electrophysiologist: [facility] Bi-V Pacing: >99%          05/21/2023 Office Weight:  168 lbs    AT/AF Burden: <1% (taking Eliquis )                                                    Attempted call to patient and unable to reach.   Transmission results reviewed.     Diet: Typically eats restaurant foods about 3 times a week and uses salt shaker at home.          CorVue thoracic impedance  suggesting normal fluid levels with the exception of possible fluid accumulation from 4/3-4/12.   Prescribed:  Furosemide  20 mg take 1 tablet(s) (20 mg total) by mouth daily.  06/19/2023 Husband manages pills and she is not sure what meds she takes.  Potassium 10 mEq take 1 tablet(s) (10 mEq total) by mouth daily.   Labs: 04/22/2023 Creatinine 0.87, BUN <5, Potassium 4.1, Sodium 135, GFR 69 04/20/2023 Creatinine 0.85, BUN 6,   Potassium 2.7, Sodium 143, GFR 71 04/18/2023 Creatinine 0.67, BUN 7,   Potassium 3.0, Sodium 143, GFR >90 01/27/2023 Creatinine 1.22, BUN 15, Potassium 4.5, Sodium 141, GFR 46 08/19/2022 Creatinine 1.26, BUN 28, Potassium 4.3, Sodium 139, GFR 22.2 07/22/2022 Creatinine 1.15, BUN 17, Potassium 4.3, Sodium 139, GFR 49  07/21/2022 Creatinine 0.98, BUN 14, Potassium 4.3, Sodium 140, GFR 60  A complete set of results can be found in Results Review.   Recommendations:  Unable to reach.     Follow-up plan: ICM clinic phone appointment on 09/29/2023.   91 day device clinic remote transmission 10/05/2023.     EP/Cardiology Office Visits:  02/05/2024 with Dr Arlester Ladd.   Copy of ICM check sent to Dr. Arlester Ladd.   3 month ICM trend: 08/24/2023.    12-14 Month ICM trend:     Almyra Jain, RN 08/26/2023 10:19 AM

## 2023-09-09 ENCOUNTER — Other Ambulatory Visit (HOSPITAL_COMMUNITY): Payer: Self-pay | Admitting: Physician Assistant

## 2023-09-09 DIAGNOSIS — I480 Paroxysmal atrial fibrillation: Secondary | ICD-10-CM

## 2023-09-09 NOTE — Telephone Encounter (Signed)
 Prescription refill request for Eliquis  received. Indication: Afib  Last office visit: 05/21/23 Lujean Sake)  Scr: 0.87 (04/22/23)  Age: 73 Weight: 76.5kg  Appropriate dose. Refill sent.

## 2023-09-29 ENCOUNTER — Ambulatory Visit: Attending: Cardiovascular Disease

## 2023-09-29 DIAGNOSIS — I5042 Chronic combined systolic (congestive) and diastolic (congestive) heart failure: Secondary | ICD-10-CM

## 2023-09-29 DIAGNOSIS — Z95810 Presence of automatic (implantable) cardiac defibrillator: Secondary | ICD-10-CM

## 2023-10-01 NOTE — Progress Notes (Signed)
 EPIC Encounter for ICM Monitoring  Patient Name: Dana Grant is a 73 y.o. female Date: 10/01/2023 Primary Care Physican: Corwin Dings, MD Primary Cardiologist: [facility] Electrophysiologist: [facility] Bi-V Pacing: >99%          05/21/2023 Office Weight:  168 lbs    AT/AF Burden: <1% (taking Eliquis )                                                    Transmission results reviewed.     Diet: Typically eats restaurant foods about 3 times a week and uses salt shaker at home.          CorVue thoracic impedance  suggesting normal fluid levels within the last month.   Prescribed:  Furosemide  20 mg take 1 tablet(s) (20 mg total) by mouth daily.  06/19/2023 Husband manages pills and she is not sure what meds she takes.    Labs: 04/22/2023 Creatinine 0.87, BUN <5, Potassium 4.1, Sodium 135, GFR 69 04/20/2023 Creatinine 0.85, BUN 6,   Potassium 2.7, Sodium 143, GFR 71 04/18/2023 Creatinine 0.67, BUN 7,   Potassium 3.0, Sodium 143, GFR >90 01/27/2023 Creatinine 1.22, BUN 15, Potassium 4.5, Sodium 141, GFR 46 08/19/2022 Creatinine 1.26, BUN 28, Potassium 4.3, Sodium 139, GFR 22.2 07/22/2022 Creatinine 1.15, BUN 17, Potassium 4.3, Sodium 139, GFR 49  07/21/2022 Creatinine 0.98, BUN 14, Potassium 4.3, Sodium 140, GFR 60  A complete set of results can be found in Results Review.   Recommendations:  No changes.   Follow-up plan: ICM clinic phone appointment on 11/23/2023.   91 day device clinic remote transmission 10/05/2023.     EP/Cardiology Office Visits:  02/05/2024 with Dr Arlester Ladd.   Copy of ICM check sent to Dr. Arlester Ladd.   3 month ICM trend: 09/29/2023.    12-14 Month ICM trend:     Almyra Jain, RN 10/01/2023 3:31 PM

## 2023-10-05 ENCOUNTER — Ambulatory Visit (INDEPENDENT_AMBULATORY_CARE_PROVIDER_SITE_OTHER): Payer: Medicare PPO

## 2023-10-05 DIAGNOSIS — I428 Other cardiomyopathies: Secondary | ICD-10-CM

## 2023-10-05 DIAGNOSIS — I5022 Chronic systolic (congestive) heart failure: Secondary | ICD-10-CM

## 2023-10-05 LAB — CUP PACEART REMOTE DEVICE CHECK
Battery Remaining Longevity: 35 mo
Battery Remaining Percentage: 39 %
Battery Voltage: 2.92 V
Brady Statistic AP VP Percent: 1 %
Brady Statistic AP VS Percent: 0 %
Brady Statistic AS VP Percent: 99 %
Brady Statistic AS VS Percent: 1 %
Brady Statistic RA Percent Paced: 1 %
Date Time Interrogation Session: 20250602022554
HighPow Impedance: 48 Ohm
HighPow Impedance: 48 Ohm
Implantable Lead Connection Status: 753985
Implantable Lead Connection Status: 753985
Implantable Lead Connection Status: 753985
Implantable Lead Implant Date: 20120905
Implantable Lead Implant Date: 20120905
Implantable Lead Implant Date: 20200611
Implantable Lead Location: 753858
Implantable Lead Location: 753859
Implantable Lead Location: 753860
Implantable Lead Model: 6947
Implantable Pulse Generator Implant Date: 20200611
Lead Channel Impedance Value: 1075 Ohm
Lead Channel Impedance Value: 390 Ohm
Lead Channel Impedance Value: 590 Ohm
Lead Channel Pacing Threshold Amplitude: 0.5 V
Lead Channel Pacing Threshold Amplitude: 0.5 V
Lead Channel Pacing Threshold Amplitude: 1 V
Lead Channel Pacing Threshold Pulse Width: 0.5 ms
Lead Channel Pacing Threshold Pulse Width: 0.5 ms
Lead Channel Pacing Threshold Pulse Width: 0.5 ms
Lead Channel Sensing Intrinsic Amplitude: 3.2 mV
Lead Channel Sensing Intrinsic Amplitude: 8.3 mV
Lead Channel Setting Pacing Amplitude: 1.5 V
Lead Channel Setting Pacing Amplitude: 2 V
Lead Channel Setting Pacing Amplitude: 2 V
Lead Channel Setting Pacing Pulse Width: 0.5 ms
Lead Channel Setting Pacing Pulse Width: 0.5 ms
Lead Channel Setting Sensing Sensitivity: 0.5 mV
Pulse Gen Serial Number: 9895991

## 2023-10-08 ENCOUNTER — Ambulatory Visit: Payer: Self-pay | Admitting: Cardiovascular Disease

## 2023-11-23 ENCOUNTER — Encounter

## 2023-11-24 NOTE — Progress Notes (Signed)
 Remote [employer] transmission.

## 2023-11-26 NOTE — Progress Notes (Signed)
 No ICM remote transmission received for 11/23/2023 and next ICM transmission scheduled for 12/07/2023.

## 2023-12-01 ENCOUNTER — Ambulatory Visit: Attending: Cardiovascular Disease

## 2023-12-01 DIAGNOSIS — Z95810 Presence of automatic (implantable) cardiac defibrillator: Secondary | ICD-10-CM | POA: Diagnosis not present

## 2023-12-01 DIAGNOSIS — I5022 Chronic systolic (congestive) heart failure: Secondary | ICD-10-CM | POA: Diagnosis not present

## 2023-12-03 ENCOUNTER — Telehealth: Payer: Self-pay

## 2023-12-03 NOTE — Progress Notes (Signed)
 EPIC Encounter for ICM Monitoring  Patient Name: Dana Grant is a 73 y.o. female Date: 12/03/2023 Primary Care Physican: Rosalva Ade, MD Primary Cardiologist: [facility] Electrophysiologist: [facility] Bi-V Pacing: >99%          05/21/2023 Office Weight:  168 lbs    AT/AF Burden: <1% (taking Eliquis )                                                    Attempted call to patient and unable to reach.    Transmission results reviewed.    Diet: Typically eats restaurant foods about 3 times a week and uses salt shaker at home.          CorVue thoracic impedance  suggesting normal fluid levels with the exception of possible fluid accumulation from 7/8-7/18.   Prescribed:  Furosemide  20 mg take 1 tablet(s) (20 mg total) by mouth daily.  06/19/2023 Husband manages pills and she is not sure what meds she takes.    Labs: 04/22/2023 Creatinine 0.87, BUN <5, Potassium 4.1, Sodium 135, GFR 69 04/20/2023 Creatinine 0.85, BUN 6,   Potassium 2.7, Sodium 143, GFR 71 04/18/2023 Creatinine 0.67, BUN 7,   Potassium 3.0, Sodium 143, GFR >90 01/27/2023 Creatinine 1.22, BUN 15, Potassium 4.5, Sodium 141, GFR 46 08/19/2022 Creatinine 1.26, BUN 28, Potassium 4.3, Sodium 139, GFR 22.2 07/22/2022 Creatinine 1.15, BUN 17, Potassium 4.3, Sodium 139, GFR 49  07/21/2022 Creatinine 0.98, BUN 14, Potassium 4.3, Sodium 140, GFR 60  A complete set of results can be found in Results Review.   Recommendations:  Unable to reach.     Follow-up plan: ICM clinic phone appointment on 01/06/2024.   91 day device clinic remote transmission 01/05/2024.     EP/Cardiology Office Visits:  02/05/2024 with Dr Nancey.   Copy of ICM check sent to Dr. Nancey.   3 month ICM trend: 11/27/2023.    12-14 Month ICM trend:     Mitzie GORMAN Garner, RN 12/03/2023 12:46 PM

## 2023-12-03 NOTE — Telephone Encounter (Signed)
 Remote ICM transmission received.  Attempted call to patient regarding ICM remote transmission and no answer.

## 2023-12-07 ENCOUNTER — Encounter

## 2024-01-05 ENCOUNTER — Ambulatory Visit (INDEPENDENT_AMBULATORY_CARE_PROVIDER_SITE_OTHER): Payer: Medicare PPO

## 2024-01-05 DIAGNOSIS — I5022 Chronic systolic (congestive) heart failure: Secondary | ICD-10-CM | POA: Diagnosis not present

## 2024-01-06 ENCOUNTER — Ambulatory Visit: Attending: Cardiovascular Disease

## 2024-01-06 DIAGNOSIS — I5022 Chronic systolic (congestive) heart failure: Secondary | ICD-10-CM | POA: Diagnosis not present

## 2024-01-06 DIAGNOSIS — Z95810 Presence of automatic (implantable) cardiac defibrillator: Secondary | ICD-10-CM | POA: Diagnosis not present

## 2024-01-07 NOTE — Progress Notes (Signed)
 EPIC Encounter for ICM Monitoring  Patient Name: Dana Grant is a 73 y.o. female Date: 01/07/2024 Primary Care Physican: Rosalva Ade, MD Primary Cardiologist: [facility] Electrophysiologist: [facility] Bi-V Pacing: >99%          05/21/2023 Office Weight:  168 lbs  01/07/2024 Weight: 166-168 lbs   AT/AF Burden: <1% (taking Eliquis )                                                    Spoke with patient and heart failure questions reviewed.  Transmission results reviewed.  Pt asymptomatic for fluid accumulation.  Reports feeling well at this time and voices no complaints.     Diet: Does not add salt to foods but is not strict with limiting foods that have salt.            CorVue thoracic impedance  suggesting intermittent days with possible fluid accumulation starting 8/13.   Prescribed:  Furosemide  20 mg take 1 tablet(s) (20 mg total) by mouth daily.  06/19/2023 Husband manages pills and she is not sure what meds she takes.    Labs: 04/22/2023 Creatinine 0.87, BUN <5, Potassium 4.1, Sodium 135, GFR 69 04/20/2023 Creatinine 0.85, BUN 6,   Potassium 2.7, Sodium 143, GFR 71 04/18/2023 Creatinine 0.67, BUN 7,   Potassium 3.0, Sodium 143, GFR >90 01/27/2023 Creatinine 1.22, BUN 15, Potassium 4.5, Sodium 141, GFR 46 08/19/2022 Creatinine 1.26, BUN 28, Potassium 4.3, Sodium 139, GFR 22.2 07/22/2022 Creatinine 1.15, BUN 17, Potassium 4.3, Sodium 139, GFR 49  07/21/2022 Creatinine 0.98, BUN 14, Potassium 4.3, Sodium 140, GFR 60  A complete set of results can be found in Results Review.   Recommendations:  No changes and encouraged to call if experiencing any fluid symptoms.   Follow-up plan: ICM clinic phone appointment on 02/15/2024.   91 day device clinic remote transmission 04/05/2024.     EP/Cardiology Office Visits:  02/05/2024 with Dr Nancey.   Copy of ICM check sent to Dr. Nancey.   3 month ICM trend: 01/06/2024.    12-14 Month ICM trend:     Mitzie GORMAN Garner, RN 01/07/2024 12:50  PM

## 2024-01-08 ENCOUNTER — Ambulatory Visit: Payer: Self-pay | Admitting: Cardiovascular Disease

## 2024-01-08 LAB — CUP PACEART REMOTE DEVICE CHECK
Battery Remaining Longevity: 31 mo
Battery Remaining Percentage: 36 %
Battery Voltage: 2.9 V
Brady Statistic AP VP Percent: 1 %
Brady Statistic AP VS Percent: 0 %
Brady Statistic AS VP Percent: 99 %
Brady Statistic AS VS Percent: 1 %
Brady Statistic RA Percent Paced: 1 %
Date Time Interrogation Session: 20250901020023
HighPow Impedance: 43 Ohm
HighPow Impedance: 43 Ohm
Implantable Lead Connection Status: 753985
Implantable Lead Connection Status: 753985
Implantable Lead Connection Status: 753985
Implantable Lead Implant Date: 20120905
Implantable Lead Implant Date: 20120905
Implantable Lead Implant Date: 20200611
Implantable Lead Location: 753858
Implantable Lead Location: 753859
Implantable Lead Location: 753860
Implantable Lead Model: 6947
Implantable Pulse Generator Implant Date: 20200611
Lead Channel Impedance Value: 1000 Ohm
Lead Channel Impedance Value: 360 Ohm
Lead Channel Impedance Value: 560 Ohm
Lead Channel Pacing Threshold Amplitude: 0.5 V
Lead Channel Pacing Threshold Amplitude: 0.625 V
Lead Channel Pacing Threshold Amplitude: 1 V
Lead Channel Pacing Threshold Pulse Width: 0.5 ms
Lead Channel Pacing Threshold Pulse Width: 0.5 ms
Lead Channel Pacing Threshold Pulse Width: 0.5 ms
Lead Channel Sensing Intrinsic Amplitude: 2.8 mV
Lead Channel Sensing Intrinsic Amplitude: 8.3 mV
Lead Channel Setting Pacing Amplitude: 1.625
Lead Channel Setting Pacing Amplitude: 2 V
Lead Channel Setting Pacing Amplitude: 2 V
Lead Channel Setting Pacing Pulse Width: 0.5 ms
Lead Channel Setting Pacing Pulse Width: 0.5 ms
Lead Channel Setting Sensing Sensitivity: 0.5 mV
Pulse Gen Serial Number: 9895991

## 2024-01-13 NOTE — Progress Notes (Signed)
Remote ICD Transmission.

## 2024-02-05 ENCOUNTER — Ambulatory Visit: Payer: Medicare PPO | Attending: Cardiovascular Disease | Admitting: Cardiovascular Disease

## 2024-02-05 ENCOUNTER — Encounter: Payer: Self-pay | Admitting: Cardiovascular Disease

## 2024-02-05 VITALS — BP 118/64 | HR 105 | Ht 66.0 in | Wt 186.0 lb

## 2024-02-05 DIAGNOSIS — I480 Paroxysmal atrial fibrillation: Secondary | ICD-10-CM | POA: Diagnosis not present

## 2024-02-05 DIAGNOSIS — I5020 Unspecified systolic (congestive) heart failure: Secondary | ICD-10-CM

## 2024-02-05 DIAGNOSIS — D6869 Other thrombophilia: Secondary | ICD-10-CM | POA: Diagnosis not present

## 2024-02-05 NOTE — Patient Instructions (Addendum)
 Medication Instructions:   Continue all current medications.   Labwork:  none  Testing/Procedures:  none  Follow-Up:  6 months - afib clinic  1 year - [facility]   Any Other Special Instructions Will Be Listed Below (If Applicable).   If you need a refill on your cardiac medications before your next appointment, please call your pharmacy.

## 2024-02-05 NOTE — Progress Notes (Signed)
  Electrophysiology Office Note:    Date:  02/05/2024   ID:  Dana Grant, Dana Grant October 18, 1950, MRN [id number]  PCP:  Rosalva Ade, MD   [facility] Providers Cardiologist:  Diannah SHAUNNA Maywood, MD Electrophysiologist:  Eulas FORBES Furbish, MD     Referring MD: Rosalva Ade, MD   History of Present Illness:    Dana Grant is a 73 y.o. 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     She has a dual-chamber ICD placed in 2012.  She has a Medtronic RV lead and a [employer] tendril atrial lead.  The device was upgraded to a CRT and June 2020 with placement of a Saint Jude LV lead and Abbott generator.  She has a history of back issues and is wheelchair dependent.  Her device detected an episode of atrial fibrillation in January 2025.  Lasted about 4-1/2 hours.  She was seen in atrial fibrillation clinic and Eliquis  started.  Since, she has not had any episodes detected by her device.     she has no device related complaints -- no new tenderness, drainage, redness.   EKGs/Labs/Other Studies Reviewed Today:    Echocardiogram:  TTE March 25, 2022 EF 60 to 65%.  Rate 1 diastolic dysfunction.   Monitors:   Stress testing:   Advanced imaging:   Cardiac catherization   EKG:         Physical Exam:    VS:  BP 118/64   Pulse (!) 105   Ht 5' 6 (1.676 m)   Wt 186 lb (84.4 kg)   SpO2 97%   BMI 30.02 kg/m     Wt Readings from Last 3 Encounters:  02/05/24 186 lb (84.4 kg)  05/21/23 168 lb 9.6 oz (76.5 kg)  01/09/23 205 lb (93 kg)     GEN:  Well nourished, well developed in no acute distress CARDIAC: RRR, no murmurs, rubs, gallops RESPIRATORY:  Normal work of breathing MUSCULOSKELETAL: no edema    ASSESSMENT & PLAN:    Chronic systolic dysfunction, nonischemic cardiomyopathy Appears euvolemic and compensated today Continue  carvedilol  25 mg, furosemide  20 mg; unable to tolerate ACE/ARB due to AKI Normal device function She is device-dependent today  History of LBBB 99% CRT pacing  HTN BP acceptable today  Paroxysmal atrial fibrillation Received from a device alert January 2025  No episodes since Continue anticoagulation for now, I do not see a strong indication for rhythm control interventions at this time  Secondary hypercoagulable state CHA2DS2-VASc score is 4 Continue apixaban  5 mg p.o. twice daily  Follow-up in A-fib clinic in 6 months  Signed, Eulas FORBES Furbish, MD  02/05/2024 8:58 AM    [facility]

## 2024-02-15 ENCOUNTER — Encounter

## 2024-02-15 NOTE — Progress Notes (Signed)
 No ICM remote transmission received for 02/15/2024 and next ICM transmission scheduled for 02/23/2024.

## 2024-02-23 ENCOUNTER — Ambulatory Visit: Attending: Cardiovascular Disease

## 2024-02-23 DIAGNOSIS — Z95810 Presence of automatic (implantable) cardiac defibrillator: Secondary | ICD-10-CM

## 2024-02-23 DIAGNOSIS — I5022 Chronic systolic (congestive) heart failure: Secondary | ICD-10-CM

## 2024-02-24 NOTE — Progress Notes (Unsigned)
 EPIC Encounter for ICM Monitoring  Patient Name: Dana Grant is a 73 y.o. female Date: 02/24/2024 Primary Care Physican: Rosalva Ade, MD Primary Cardiologist: [facility] Electrophysiologist: [facility] Bi-V Pacing: >99%          05/21/2023 Office Weight:  168 lbs  01/07/2024 Weight: 166-168 lbs   AT/AF Burden: 0% (taking Eliquis )                                                    Attempted call to patient and unable to reach.   Transmission results reviewed.      Diet: Does not add salt to foods but is not strict with limiting foods that have salt.            Since 01/06/2024 ICM Remote Transmission: CorVue thoracic impedance  suggesting slight possible fluid accumulation from 01/24/2024-01/31/2024 and 02/07/2024-02/18/2024.   Prescribed:  Furosemide  20 mg take 1 tablet(s) (20 mg total) by mouth daily.  06/19/2023 Husband manages pills and she is not sure what meds she takes.    Labs: 04/22/2023 Creatinine 0.87, BUN <5, Potassium 4.1, Sodium 135, GFR 69 04/20/2023 Creatinine 0.85, BUN 6,   Potassium 2.7, Sodium 143, GFR 71 04/18/2023 Creatinine 0.67, BUN 7,   Potassium 3.0, Sodium 143, GFR >90 01/27/2023 Creatinine 1.22, BUN 15, Potassium 4.5, Sodium 141, GFR 46 08/19/2022 Creatinine 1.26, BUN 28, Potassium 4.3, Sodium 139, GFR 22.2 07/22/2022 Creatinine 1.15, BUN 17, Potassium 4.3, Sodium 139, GFR 49  07/21/2022 Creatinine 0.98, BUN 14, Potassium 4.3, Sodium 140, GFR 60  A complete set of results can be found in Results Review.   Recommendations:   Unable to reach.     Follow-up plan: ICM clinic phone appointment on 03/28/2024.   91 day device clinic remote transmission 04/05/2024.     EP/Cardiology Office Visits:  Recall 01/30/2025 with Dr Nancey.   Copy of ICM check sent to Dr. Nancey.   Remote monitoring is medically necessary for Heart Failure Management.    Daily Thoracic Impedance ICM trend: 11/25/2023 through 02/23/2024.    12-14 Month Thoracic Impedance ICM trend:      Mitzie GORMAN Garner, RN 02/24/2024 10:15 AM

## 2024-02-26 ENCOUNTER — Telehealth: Payer: Self-pay

## 2024-02-26 NOTE — Telephone Encounter (Signed)
 Remote ICM transmission received.  Attempted call to patient regarding ICM remote transmission and no answer.

## 2024-02-27 ENCOUNTER — Other Ambulatory Visit: Payer: Self-pay | Admitting: Internal Medicine

## 2024-02-27 DIAGNOSIS — I480 Paroxysmal atrial fibrillation: Secondary | ICD-10-CM

## 2024-02-29 NOTE — Telephone Encounter (Signed)
 Prescription refill request for Eliquis  received. Indication: PAF Last office visit: 02/05/24  A [facility] MD Scr: 0.87 on 04/22/23  Epic Age: 73 Weight: 84.4kg  Based on above findings Eliquis  5mg  twice daily is the appropriate dose.  Refill approved.

## 2024-03-28 ENCOUNTER — Ambulatory Visit: Attending: Cardiovascular Disease

## 2024-03-28 DIAGNOSIS — I5022 Chronic systolic (congestive) heart failure: Secondary | ICD-10-CM

## 2024-03-28 DIAGNOSIS — Z95810 Presence of automatic (implantable) cardiac defibrillator: Secondary | ICD-10-CM

## 2024-03-30 NOTE — Progress Notes (Signed)
 EPIC Encounter for ICM Monitoring  Patient Name: Dana Grant is a 73 y.o. female Date: 03/30/2024 Primary Care Physican: Rosalva Ade, MD Primary Cardiologist: [facility] Electrophysiologist: [facility] Bi-V Pacing: >99%          05/21/2023 Office Weight:  168 lbs  01/07/2024 Weight: 166-168 lbs   AT/AF Burden: 0% (taking Eliquis )                                                    Transmission results reviewed.      Diet: Does not add salt to foods but is not strict with limiting foods that have salt.            Since 02/23/2024 ICM Remote Transmission: CorVue thoracic impedance suggesting normal fluid levels with the exception of possible fluid accumulation from 03/03/2024-11/10/20255.   Prescribed:  Furosemide  20 mg take 1 tablet(s) (20 mg total) by mouth daily.  06/19/2023 Husband manages pills and she is not sure what meds she takes.    Labs: 04/22/2023 Creatinine 0.87, BUN <5, Potassium 4.1, Sodium 135, GFR 69 04/20/2023 Creatinine 0.85, BUN 6,   Potassium 2.7, Sodium 143, GFR 71 04/18/2023 Creatinine 0.67, BUN 7,   Potassium 3.0, Sodium 143, GFR >90 01/27/2023 Creatinine 1.22, BUN 15, Potassium 4.5, Sodium 141, GFR 46 08/19/2022 Creatinine 1.26, BUN 28, Potassium 4.3, Sodium 139, GFR 22.2 07/22/2022 Creatinine 1.15, BUN 17, Potassium 4.3, Sodium 139, GFR 49  07/21/2022 Creatinine 0.98, BUN 14, Potassium 4.3, Sodium 140, GFR 60  A complete set of results can be found in Results Review.   Recommendations:   No changes.    Follow-up plan: ICM clinic phone appointment on 05/09/2024.   91 day device clinic remote transmission 04/05/2024.     EP/Cardiology Office Visits:  Recall 01/30/2025 with Dr Nancey.   Copy of ICM check sent to Dr. Nancey.     Remote monitoring is medically necessary for Heart Failure Management.    Daily Thoracic Impedance ICM trend: 12/29/2023 through 03/28/2024.    12-14 Month Thoracic Impedance ICM trend:     Dana GORMAN Garner, RN 03/30/2024 12:58  PM

## 2024-04-05 ENCOUNTER — Ambulatory Visit: Payer: Medicare PPO

## 2024-04-05 DIAGNOSIS — I5022 Chronic systolic (congestive) heart failure: Secondary | ICD-10-CM

## 2024-04-06 LAB — CUP PACEART REMOTE DEVICE CHECK
Battery Remaining Longevity: 29 mo
Battery Remaining Percentage: 32 %
Battery Voltage: 2.9 V
Brady Statistic AP VP Percent: 1 %
Brady Statistic AP VS Percent: 0 %
Brady Statistic AS VP Percent: 99 %
Brady Statistic AS VS Percent: 1 %
Brady Statistic RA Percent Paced: 1 %
Date Time Interrogation Session: 20251202020016
HighPow Impedance: 44 Ohm
HighPow Impedance: 44 Ohm
Implantable Lead Connection Status: 753985
Implantable Lead Connection Status: 753985
Implantable Lead Connection Status: 753985
Implantable Lead Implant Date: 20120905
Implantable Lead Implant Date: 20120905
Implantable Lead Implant Date: 20200611
Implantable Lead Location: 753858
Implantable Lead Location: 753859
Implantable Lead Location: 753860
Implantable Lead Model: 6947
Implantable Pulse Generator Implant Date: 20200611
Lead Channel Impedance Value: 1000 Ohm
Lead Channel Impedance Value: 350 Ohm
Lead Channel Impedance Value: 550 Ohm
Lead Channel Pacing Threshold Amplitude: 0.5 V
Lead Channel Pacing Threshold Amplitude: 0.5 V
Lead Channel Pacing Threshold Amplitude: 1.125 V
Lead Channel Pacing Threshold Pulse Width: 0.5 ms
Lead Channel Pacing Threshold Pulse Width: 0.5 ms
Lead Channel Pacing Threshold Pulse Width: 0.5 ms
Lead Channel Sensing Intrinsic Amplitude: 2.5 mV
Lead Channel Sensing Intrinsic Amplitude: 8.3 mV
Lead Channel Setting Pacing Amplitude: 1.5 V
Lead Channel Setting Pacing Amplitude: 2 V
Lead Channel Setting Pacing Amplitude: 2 V
Lead Channel Setting Pacing Pulse Width: 0.5 ms
Lead Channel Setting Pacing Pulse Width: 0.5 ms
Lead Channel Setting Sensing Sensitivity: 0.5 mV
Pulse Gen Serial Number: 9895991

## 2024-04-07 ENCOUNTER — Ambulatory Visit: Payer: Self-pay | Admitting: Cardiovascular Disease

## 2024-04-08 NOTE — Progress Notes (Signed)
 Remote ICD Transmission

## 2024-05-09 ENCOUNTER — Ambulatory Visit

## 2024-05-09 DIAGNOSIS — I5022 Chronic systolic (congestive) heart failure: Secondary | ICD-10-CM

## 2024-05-09 DIAGNOSIS — Z95810 Presence of automatic (implantable) cardiac defibrillator: Secondary | ICD-10-CM | POA: Diagnosis not present

## 2024-05-11 NOTE — Progress Notes (Signed)
 EPIC Encounter for ICM Monitoring  Patient Name: Dana Grant is a 74 y.o. female Date: 05/11/2024 Primary Care Physican: Rosalva Ade, MD Primary Cardiologist: [facility] Electrophysiologist: [facility] Bi-V Pacing: >99%          05/21/2023 Office Weight:  168 lbs  01/07/2024 Weight: 166-168 lbs   AT/AF Burden: 0% (taking Eliquis )                                                    Transmission results reviewed.      Diet: Does not add salt to foods but is not strict with limiting foods that have salt.            Since 03/28/2024 ICM Remote Transmission: CorVue thoracic impedance suggesting normal fluid levels.   Prescribed:  Furosemide  20 mg take 1 tablet(s) (20 mg total) by mouth daily.  06/19/2023 Husband manages pills and she is not sure what meds she takes.    Labs: 04/22/2023 Creatinine 0.87, BUN <5, Potassium 4.1, Sodium 135, GFR 69 04/20/2023 Creatinine 0.85, BUN 6,   Potassium 2.7, Sodium 143, GFR 71 04/18/2023 Creatinine 0.67, BUN 7,   Potassium 3.0, Sodium 143, GFR >90 01/27/2023 Creatinine 1.22, BUN 15, Potassium 4.5, Sodium 141, GFR 46 08/19/2022 Creatinine 1.26, BUN 28, Potassium 4.3, Sodium 139, GFR 22.2 07/22/2022 Creatinine 1.15, BUN 17, Potassium 4.3, Sodium 139, GFR 49  07/21/2022 Creatinine 0.98, BUN 14, Potassium 4.3, Sodium 140, GFR 60  A complete set of results can be found in Results Review.   Recommendations:   No changes.    Follow-up plan: ICM clinic phone appointment on 06/09/2024.   91 day device clinic remote transmission 07/05/2024.     EP/Cardiology Office Visits:  Recall 01/30/2025 with Dr Nancey.   Copy of ICM check sent to Dr. Nancey.     Remote monitoring is medically necessary for Heart Failure Management.    Daily Thoracic Impedance ICM trend: 02/09/2024 through 05/09/2024.    12-14 Month Thoracic Impedance ICM trend:     Mitzie GORMAN Garner, RN 05/11/2024 1:43 PM

## 2024-06-02 NOTE — Progress Notes (Signed)
 31 day ICM Remote transmission canceled due to [facility] clinic is on hold until further notice.  91 day remote monitoring will continue per protocol.

## 2024-06-09 ENCOUNTER — Ambulatory Visit

## 2024-08-05 ENCOUNTER — Ambulatory Visit (HOSPITAL_COMMUNITY): Admitting: Physician Assistant
